# Patient Record
Sex: Female | Born: 1955
Health system: Southern US, Community
[De-identification: ages and names within clinical notes are randomized; demographics above are authoritative.]

## PROBLEM LIST (undated history)

## (undated) DIAGNOSIS — F419 Anxiety disorder, unspecified: Secondary | ICD-10-CM

## (undated) DIAGNOSIS — C4490 Unspecified malignant neoplasm of skin, unspecified: Secondary | ICD-10-CM

## (undated) DIAGNOSIS — A0472 Enterocolitis due to Clostridium difficile, not specified as recurrent: Secondary | ICD-10-CM

## (undated) DIAGNOSIS — I10 Essential (primary) hypertension: Secondary | ICD-10-CM

## (undated) DIAGNOSIS — M810 Age-related osteoporosis without current pathological fracture: Secondary | ICD-10-CM

## (undated) DIAGNOSIS — M1990 Unspecified osteoarthritis, unspecified site: Secondary | ICD-10-CM

## (undated) DIAGNOSIS — N183 Chronic kidney disease, stage 3 unspecified: Secondary | ICD-10-CM

## (undated) DIAGNOSIS — F32A Depression, unspecified: Secondary | ICD-10-CM

## (undated) DIAGNOSIS — D649 Anemia, unspecified: Secondary | ICD-10-CM

## (undated) DIAGNOSIS — B1910 Unspecified viral hepatitis B without hepatic coma: Secondary | ICD-10-CM

## (undated) DIAGNOSIS — K52832 Lymphocytic colitis: Secondary | ICD-10-CM

## (undated) DIAGNOSIS — R413 Other amnesia: Secondary | ICD-10-CM

## (undated) DIAGNOSIS — E785 Hyperlipidemia, unspecified: Secondary | ICD-10-CM

## (undated) DIAGNOSIS — R519 Headache, unspecified: Secondary | ICD-10-CM

## (undated) DIAGNOSIS — N200 Calculus of kidney: Secondary | ICD-10-CM

## (undated) DIAGNOSIS — Z905 Acquired absence of kidney: Secondary | ICD-10-CM

## (undated) DIAGNOSIS — Z950 Presence of cardiac pacemaker: Secondary | ICD-10-CM

## (undated) HISTORY — PX: UPPER GASTROINTESTINAL ENDOSCOPY: SHX188

## (undated) HISTORY — PX: BREAST SURGERY: SHX581

## (undated) HISTORY — DX: Calculus of kidney: N20.0

## (undated) HISTORY — DX: Enterocolitis due to Clostridium difficile, not specified as recurrent: A04.72

## (undated) HISTORY — PX: RHINOPLASTY: SUR1284

## (undated) HISTORY — PX: LAPAROSCOPIC BILATERAL SALPINGO OOPHERECTOMY: SHX5890

## (undated) HISTORY — DX: Anxiety disorder, unspecified: F41.9

## (undated) HISTORY — DX: Unspecified viral hepatitis B without hepatic coma: B19.10

## (undated) HISTORY — PX: COLONOSCOPY: SHX174

## (undated) HISTORY — PX: OTHER SURGICAL HISTORY: SHX169

## (undated) HISTORY — DX: Lymphocytic colitis: K52.832

## (undated) HISTORY — PX: PACEMAKER IMPLANT: EP1218

## (undated) HISTORY — PX: BILATERAL CARPAL TUNNEL RELEASE: SHX6508

## (undated) HISTORY — PX: FOOT SURGERY: SHX648

## (undated) HISTORY — PX: ABDOMINOPLASTY: SUR9

## (undated) HISTORY — PX: ABDOMINAL HYSTERECTOMY: SHX81

## (undated) HISTORY — DX: Age-related osteoporosis without current pathological fracture: M81.0

## (undated) HISTORY — DX: Headache, unspecified: R51.9

## (undated) HISTORY — PX: NEPHROSTOMY: SHX1014

## (undated) HISTORY — PX: HIP SURGERY: SHX245

## (undated) HISTORY — PX: ROTATOR CUFF REPAIR: SHX139

## (undated) HISTORY — PX: NEPHRECTOMY: SHX65

## (undated) HISTORY — DX: Unspecified malignant neoplasm of skin, unspecified: C44.90

## (undated) HISTORY — DX: Hyperlipidemia, unspecified: E78.5

## (undated) HISTORY — DX: Anemia, unspecified: D64.9

## (undated) HISTORY — DX: Essential (primary) hypertension: I10

## (undated) HISTORY — DX: Depression, unspecified: F32.A

## (undated) HISTORY — DX: Unspecified osteoarthritis, unspecified site: M19.90

## (undated) HISTORY — PX: LIPOSUCTION: SHX10

## (undated) HISTORY — DX: Acquired absence of kidney: Z90.5

## (undated) HISTORY — DX: Other amnesia: R41.3

## (undated) HISTORY — PX: HERNIA REPAIR: SHX51

## (undated) HISTORY — PX: TONSILECTOMY/ADENOIDECTOMY WITH MYRINGOTOMY: SHX6125

---
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
## 1977-06-05 HISTORY — PX: BREAST LUMPECTOMY: SHX2

## 1996-06-05 HISTORY — PX: UMBILICAL HERNIA REPAIR: SHX196

## 2006-11-04 HISTORY — PX: ABDOMINAL HYSTERECTOMY: SHX81

## 2007-06-06 HISTORY — PX: NEPHRECTOMY: SHX65

## 2009-03-29 HISTORY — PX: CARPAL TUNNEL RELEASE: SHX101

## 2012-06-19 DIAGNOSIS — R109 Unspecified abdominal pain: Secondary | ICD-10-CM | POA: Insufficient documentation

## 2012-11-18 DIAGNOSIS — G43909 Migraine, unspecified, not intractable, without status migrainosus: Secondary | ICD-10-CM | POA: Insufficient documentation

## 2013-06-05 DIAGNOSIS — N183 Chronic kidney disease, stage 3 unspecified: Secondary | ICD-10-CM

## 2013-06-05 HISTORY — DX: Chronic kidney disease, stage 3 unspecified: N18.30

## 2014-04-24 HISTORY — PX: PACEMAKER IMPLANT: EP1218

## 2014-04-24 HISTORY — PX: CARDIAC CATHETERIZATION: SHX172

## 2016-04-25 HISTORY — PX: HIP ARTHROSCOPY: SHX668

## 2016-04-25 HISTORY — PX: OTHER SURGICAL HISTORY: SHX169

## 2016-09-14 HISTORY — PX: HIP ARTHROSCOPY: SUR88

## 2018-10-02 DIAGNOSIS — M1990 Unspecified osteoarthritis, unspecified site: Secondary | ICD-10-CM | POA: Insufficient documentation

## 2018-10-02 DIAGNOSIS — G4733 Obstructive sleep apnea (adult) (pediatric): Secondary | ICD-10-CM | POA: Insufficient documentation

## 2018-10-02 DIAGNOSIS — Z950 Presence of cardiac pacemaker: Secondary | ICD-10-CM | POA: Insufficient documentation

## 2018-10-02 DIAGNOSIS — E538 Deficiency of other specified B group vitamins: Secondary | ICD-10-CM | POA: Insufficient documentation

## 2018-10-02 DIAGNOSIS — N200 Calculus of kidney: Secondary | ICD-10-CM | POA: Insufficient documentation

## 2018-10-02 DIAGNOSIS — F339 Major depressive disorder, recurrent, unspecified: Secondary | ICD-10-CM | POA: Insufficient documentation

## 2018-10-02 DIAGNOSIS — M546 Pain in thoracic spine: Secondary | ICD-10-CM | POA: Insufficient documentation

## 2018-10-02 DIAGNOSIS — I495 Sick sinus syndrome: Secondary | ICD-10-CM | POA: Insufficient documentation

## 2018-10-02 DIAGNOSIS — I10 Essential (primary) hypertension: Secondary | ICD-10-CM | POA: Insufficient documentation

## 2018-10-02 DIAGNOSIS — G4700 Insomnia, unspecified: Secondary | ICD-10-CM | POA: Insufficient documentation

## 2018-10-02 DIAGNOSIS — N183 Chronic kidney disease, stage 3 unspecified: Secondary | ICD-10-CM | POA: Insufficient documentation

## 2018-10-02 DIAGNOSIS — M76899 Other specified enthesopathies of unspecified lower limb, excluding foot: Secondary | ICD-10-CM | POA: Insufficient documentation

## 2018-10-02 DIAGNOSIS — M810 Age-related osteoporosis without current pathological fracture: Secondary | ICD-10-CM | POA: Insufficient documentation

## 2018-10-02 DIAGNOSIS — F4310 Post-traumatic stress disorder, unspecified: Secondary | ICD-10-CM | POA: Insufficient documentation

## 2018-10-02 DIAGNOSIS — K219 Gastro-esophageal reflux disease without esophagitis: Secondary | ICD-10-CM | POA: Insufficient documentation

## 2018-12-30 HISTORY — PX: SHOULDER ARTHROSCOPY: SHX128

## 2019-11-23 HISTORY — PX: LUMBAR LAMINECTOMY: SHX95

## 2019-11-27 HISTORY — PX: WEIL OSTEOTOMY: SHX5044

## 2020-04-08 ENCOUNTER — Emergency Department (HOSPITAL_BASED_OUTPATIENT_CLINIC_OR_DEPARTMENT_OTHER)
Admission: EM | Admit: 2020-04-08 | Discharge: 2020-04-08 | Disposition: A | Discharge status: Home / Self Care | Payer: Medicare (Managed Care) | Attending: Emergency Medicine | Admitting: Emergency Medicine

## 2020-04-08 ENCOUNTER — Encounter (HOSPITAL_BASED_OUTPATIENT_CLINIC_OR_DEPARTMENT_OTHER): Payer: Self-pay

## 2020-04-08 ENCOUNTER — Other Ambulatory Visit: Payer: Self-pay

## 2020-04-08 ENCOUNTER — Ambulatory Visit
Admission: EM | Admit: 2020-04-08 | Discharge: 2020-04-08 | Payer: Medicare (Managed Care) | Attending: Family Medicine | Admitting: Family Medicine

## 2020-04-08 ENCOUNTER — Emergency Department (HOSPITAL_BASED_OUTPATIENT_CLINIC_OR_DEPARTMENT_OTHER): Payer: Medicare (Managed Care)

## 2020-04-08 DIAGNOSIS — M549 Dorsalgia, unspecified: Secondary | ICD-10-CM

## 2020-04-08 DIAGNOSIS — M545 Low back pain, unspecified: Secondary | ICD-10-CM

## 2020-04-08 DIAGNOSIS — Z950 Presence of cardiac pacemaker: Secondary | ICD-10-CM | POA: Insufficient documentation

## 2020-04-08 DIAGNOSIS — R7989 Other specified abnormal findings of blood chemistry: Secondary | ICD-10-CM

## 2020-04-08 DIAGNOSIS — R202 Paresthesia of skin: Secondary | ICD-10-CM | POA: Insufficient documentation

## 2020-04-08 DIAGNOSIS — N1339 Other hydronephrosis: Secondary | ICD-10-CM

## 2020-04-08 DIAGNOSIS — R109 Unspecified abdominal pain: Secondary | ICD-10-CM | POA: Diagnosis present

## 2020-04-08 DIAGNOSIS — N183 Chronic kidney disease, stage 3 unspecified: Secondary | ICD-10-CM | POA: Diagnosis not present

## 2020-04-08 HISTORY — DX: Presence of cardiac pacemaker: Z95.0

## 2020-04-08 HISTORY — DX: Chronic kidney disease, stage 3 unspecified: N18.30

## 2020-04-08 LAB — POCT URINALYSIS DIP (MANUAL ENTRY)
Bilirubin, UA: NEGATIVE
Blood, UA: NEGATIVE
Glucose, UA: NEGATIVE mg/dL
Ketones, POC UA: NEGATIVE mg/dL
Nitrite, UA: NEGATIVE
Protein Ur, POC: NEGATIVE mg/dL
Spec Grav, UA: 1.01 (ref 1.010–1.025)
Urobilinogen, UA: 0.2 E.U./dL
pH, UA: 5.5 (ref 5.0–8.0)

## 2020-04-08 LAB — CBC WITH DIFFERENTIAL/PLATELET
Abs Immature Granulocytes: 0.01 10*3/uL (ref 0.00–0.07)
Basophils Absolute: 0.1 10*3/uL (ref 0.0–0.1)
Basophils Relative: 1 %
Eosinophils Absolute: 0.1 10*3/uL (ref 0.0–0.5)
Eosinophils Relative: 2 %
HCT: 43.9 % (ref 36.0–46.0)
Hemoglobin: 14.8 g/dL (ref 12.0–15.0)
Immature Granulocytes: 0 %
Lymphocytes Relative: 24 %
Lymphs Abs: 1.4 10*3/uL (ref 0.7–4.0)
MCH: 31.1 pg (ref 26.0–34.0)
MCHC: 33.7 g/dL (ref 30.0–36.0)
MCV: 92.2 fL (ref 80.0–100.0)
Monocytes Absolute: 0.5 10*3/uL (ref 0.1–1.0)
Monocytes Relative: 9 %
Neutro Abs: 3.9 10*3/uL (ref 1.7–7.7)
Neutrophils Relative %: 64 %
Platelets: 210 10*3/uL (ref 150–400)
RBC: 4.76 MIL/uL (ref 3.87–5.11)
RDW: 12.7 % (ref 11.5–15.5)
WBC: 6 10*3/uL (ref 4.0–10.5)
nRBC: 0 % (ref 0.0–0.2)

## 2020-04-08 LAB — COMPREHENSIVE METABOLIC PANEL
ALT: 292 U/L — ABNORMAL HIGH (ref 0–44)
AST: 179 U/L — ABNORMAL HIGH (ref 15–41)
Albumin: 4.3 g/dL (ref 3.5–5.0)
Alkaline Phosphatase: 110 U/L (ref 38–126)
Anion gap: 10 (ref 5–15)
BUN: 18 mg/dL (ref 8–23)
CO2: 23 mmol/L (ref 22–32)
Calcium: 9.2 mg/dL (ref 8.9–10.3)
Chloride: 102 mmol/L (ref 98–111)
Creatinine, Ser: 0.89 mg/dL (ref 0.44–1.00)
GFR, Estimated: >60 mL/min (ref >60)
Glucose, Bld: 86 mg/dL (ref 70–99)
Potassium: 4.2 mmol/L (ref 3.5–5.1)
Sodium: 135 mmol/L (ref 135–145)
Total Bilirubin: 0.8 mg/dL (ref 0.3–1.2)
Total Protein: 7.7 g/dL (ref 6.5–8.1)

## 2020-04-08 LAB — URINALYSIS, ROUTINE W REFLEX MICROSCOPIC
Bilirubin Urine: NEGATIVE
Glucose, UA: NEGATIVE mg/dL
Hgb urine dipstick: NEGATIVE
Ketones, ur: NEGATIVE mg/dL
Leukocytes,Ua: NEGATIVE
Nitrite: NEGATIVE
Protein, ur: NEGATIVE mg/dL
Specific Gravity, Urine: <1.005 — ABNORMAL LOW (ref 1.005–1.030)
pH: 6 (ref 5.0–8.0)

## 2020-04-08 MED ORDER — DIAZEPAM 5 MG/ML IJ SOLN
5.0000 mg | Freq: Once | INTRAMUSCULAR | Status: AC
  Administered 2020-04-08: 5 mg via INTRAVENOUS
  Filled 2020-04-08: qty 2

## 2020-04-08 MED ORDER — OXYCODONE HCL 5 MG PO TABS
5.0000 mg | ORAL_TABLET | Freq: Four times a day (QID) | ORAL | 0 refills | Status: DC | PRN
Start: 2020-04-08 — End: 2020-05-19

## 2020-04-08 MED ORDER — PROMETHAZINE HCL 25 MG/ML IJ SOLN
25.0000 mg | Freq: Once | INTRAMUSCULAR | Status: AC
  Administered 2020-04-08: 25 mg via INTRAVENOUS
  Filled 2020-04-08: qty 1

## 2020-04-08 MED ORDER — MORPHINE SULFATE (PF) 4 MG/ML IV SOLN
4.0000 mg | Freq: Once | INTRAVENOUS | Status: AC
  Administered 2020-04-08: 4 mg via INTRAVENOUS
  Filled 2020-04-08: qty 1

## 2020-04-08 MED ORDER — SODIUM CHLORIDE 0.9 % IV BOLUS
1000.0000 mL | Freq: Once | INTRAVENOUS | Status: AC
  Administered 2020-04-08: 1000 mL via INTRAVENOUS

## 2020-04-08 MED ORDER — TAMSULOSIN HCL 0.4 MG PO CAPS
0.4000 mg | ORAL_CAPSULE | Freq: Every day | ORAL | 0 refills | Status: DC
Start: 2020-04-08 — End: 2020-06-29

## 2020-04-08 NOTE — ED Triage Notes (Signed)
Pt reports having flank and back pain x3 days. Also having nausea.

## 2020-04-08 NOTE — Discharge Instructions (Addendum)
You have a swollen left kidney likely because you passed a stone or you have a stricture.  Take oxycodone for pain.  Take Flomax as prescribed for several days.  Your liver enzymes are elevated so please avoid taking any Tylenol  You need your liver enzymes rechecked in a week.  See urologist for follow-up.  Return to ER if you have worse abdominal pain, flank pain, back pain, numbness, weakness.

## 2020-04-08 NOTE — ED Provider Notes (Signed)
[name] Provider Note   CSN: [id number] Arrival date & time: 04/08/20  1645     History Chief Complaint  Patient presents with  . Flank Pain    Kim Pennington is a 64 y.o. female history of stage III CKD, previous right nephrectomy here presenting with left flank pain.  Patient is here visiting from [place].  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  She states that for the last 3 to 4 days, she has been having left flank pain.  States that the pain is sharp and radiates down the left hip area.  Patient states that she has numbness down his left leg.  She denies any incontinence.  She went to urgent care and was sent a referral evaluation.  Denies any fevers or chills or urinary symptoms.  The history is provided by the patient.       Past Medical History:  Diagnosis Date  . Kidney disease, chronic, stage III (GFR 30-59 ml/min) (HCC)   . Pacemaker     There are no problems to display for this patient.   Past Surgical History:  Procedure Laterality Date  . FOOT SURGERY    . NEPHRECTOMY    . PACEMAKER IMPLANT       OB History   No obstetric history on file.     No family history on file.  Social History   Tobacco Use  . Smoking status: Never Smoker  . Smokeless tobacco: Never Used  Vaping Use  . Vaping Use: Never used  Substance Use Topics  . Alcohol use: Never  . Drug use: Never    Home Medications Prior to Admission medications   Not on File    Allergies    Patient has no known allergies.  Review of Systems   Review of Systems  Gastrointestinal: Positive for abdominal pain.  Genitourinary: Positive for flank pain.  All other systems reviewed and are negative.   Physical Exam Updated Vital Signs BP 107/69 (BP Location: Left Arm)   Pulse 82   Temp 97.9 F (36.6 C) (Oral)   Resp 18   Ht 5\' 2"  (1.575 m)   Wt 57.6 kg   SpO2 99%   BMI 23.23 kg/m   Physical Exam Vitals and nursing note reviewed.    Constitutional:      Comments: Uncomfortable and doubled over with pain  HENT:     Head: Normocephalic.     Nose: Nose normal.     Mouth/Throat:     Mouth: Mucous membranes are moist.  Eyes:     Extraocular Movements: Extraocular movements intact.     Pupils: Pupils are equal, round, and reactive to light.  Cardiovascular:     Rate and Rhythm: Normal rate and regular rhythm.     Pulses: Normal pulses.     Heart sounds: Normal heart sounds.  Pulmonary:     Effort: Pulmonary effort is normal.     Breath sounds: Normal breath sounds.  Abdominal:     General: Abdomen is flat.     Palpations: Abdomen is soft.     Comments: Mild left CVA tenderness.  Also has left paralumbar tenderness  Musculoskeletal:     Cervical back: Normal range of motion.     Comments: Straight leg raise test positive on the left side.  Patient has no saddle anesthesia on exam.  Motor exam lower extremities  Skin:    General: Skin is warm.     Capillary Refill:  Capillary refill takes less than 2 seconds.  Neurological:     Mental Status: She is alert.     Comments: No saddle anesthesia and normal motor exam bilateral lower extremities.  Normal reflexes bilaterally.  Psychiatric:        Mood and Affect: Mood normal.        Behavior: Behavior normal.     ED Results / Procedures / Treatments   Labs (all labs ordered are listed, but only abnormal results are displayed) Labs Reviewed  COMPREHENSIVE METABOLIC PANEL - Abnormal; Notable for the following components:      Result Value   AST 179 (*)    ALT 292 (*)    All other components within normal limits  URINALYSIS, ROUTINE W REFLEX MICROSCOPIC - Abnormal; Notable for the following components:   Specific Gravity, Urine <1.005 (*)    All other components within normal limits  CBC WITH DIFFERENTIAL/PLATELET    EKG None  Radiology CT L-SPINE NO CHARGE  Result Date: 04/08/2020 CLINICAL DATA:  Flank and back pain for 3 days. EXAM: CT LUMBAR SPINE  WITHOUT CONTRAST TECHNIQUE: Multidetector CT imaging of the lumbar spine was performed without intravenous contrast administration. Multiplanar CT image reconstructions were also generated. COMPARISON:  Concurrent CT renal stone. FINDINGS: Segmentation: Transitional lumbosacral anatomy with S1 lumbarization. Alignment: Preservation of lordosis.  Slight lumbar levocurvature. Vertebrae: No fracture or suspicious osseous lesion. Vertebral body heights are preserved. Mild multilevel degenerative changes most prominent at the T11-12 level. Paraspinal and other soft tissues: Paraspinal soft tissues within normal limits. Sequela of right nephrectomy. Intra-abdominal findings including left hydronephrosis are further detailed on concurrent CT renal stone. Disc levels: Patent bony spinal canal.  Patent bony neural foramina. IMPRESSION: No acute osseous abnormality. Mild spondylosis. Partially imaged left hydronephrosis. Please see concurrent CT renal stone for additional findings. Electronically Signed   By: Primitivo Gauze M.D.   On: 04/08/2020 18:10   CT Renal Stone Study  Result Date: 04/08/2020 CLINICAL DATA:  Left-sided flank pain EXAM: CT ABDOMEN AND PELVIS WITHOUT CONTRAST TECHNIQUE: Multidetector CT imaging of the abdomen and pelvis was performed following the standard protocol without IV contrast. COMPARISON:  None. FINDINGS: Lower chest: The visualized heart size within normal limits. No pericardial fluid/thickening. No hiatal hernia. The visualized portions of the lungs are clear. Hepatobiliary: Although limited due to the lack of intravenous contrast, normal in appearance without gross focal abnormality. No evidence of calcified gallstones or biliary ductal dilatation. Pancreas:  Unremarkable.  No surrounding inflammatory changes. Spleen: Normal in size. Although limited due to the lack of intravenous contrast, normal in appearance. Adrenals/Urinary Tract: Both adrenal glands appear normal. The patient is  status post right-sided nephrectomy. Left-sided pelvicaliectasis is seen to the UPJ. No renal or collecting system calculi are noted. The bladder is unremarkable. Stomach/Bowel: The stomach, small bowel, and colon are normal in appearance. No inflammatory changes or obstructive findings. A moderate amount of colonic is throughout. Vascular/Lymphatic: There are no enlarged abdominal or pelvic lymph nodes. Scattered aortic within the distal aorta. Reproductive: The patient is status post hysterectomy. No adnexal masses or collections seen. Other: No evidence of abdominal wall mass or hernia. Musculoskeletal: No acute or significant osseous findings. IMPRESSION: Mild left hydronephrosis to the UPJ which could be from UPJ obstruction. No renal or collecting system calculi. Moderate amount stool without evidence of obstruction. Electronically Signed   By: Prudencio Pair M.D.   On: 04/08/2020 18:24    Procedures Procedures (including critical care time)  Medications  Ordered in ED Medications  morphine 4 MG/ML injection 4 mg (4 mg Intravenous Given 04/08/20 1837)  diazepam (VALIUM) injection 5 mg (5 mg Intravenous Given 04/08/20 1838)  promethazine (PHENERGAN) injection 25 mg (25 mg Intravenous Given 04/08/20 1831)  sodium chloride 0.9 % bolus 1,000 mL (1,000 mLs Intravenous New Bag/Given 04/08/20 1821)    ED Course  I have reviewed the triage vital signs and the nursing notes.  Pertinent labs & imaging results that were available during my care of the patient were reviewed by me and considered in my medical decision making (see chart for details).    MDM Rules/Calculators/A&P                         Kim Pennington is a 64 y.o. female here presenting with left flank pain and left-sided back pain.  Likely renal colic versus sciatica.  She has no red flags require MRI.  Plan to get CT renal stone and CT lumbar spine.  We will also get CBC and CMP and urinalysis.  Will give pain medicine and muscle relaxant  and reassess.  6:57 PM Labs showed slightly elevated LFTs.  Patient does not take any Tylenol and does not drink alcohol.  Patient has no right upper quadrant pain.  Patient CT showed left Hydro.  I suspect that she passed a stone.  She can certainly have sciatica as well.  Plan to discharge patient home with pain medicine, Flomax, urology follow-up.  LFTs can be followed up outpatient   Final Clinical Impression(s) / ED Diagnoses Final diagnoses:  Back pain    Rx / DC Orders ED Discharge Orders    None       Drenda Freeze, MD 04/08/20 1859

## 2020-04-08 NOTE — Discharge Instructions (Addendum)
UA negative for infection  I would have you go to the ER for further evaluation and treatment

## 2020-04-08 NOTE — ED Triage Notes (Signed)
Pt c/o left flank pain started yesterday-reports hx of stage 3 kidney disease and right nephrectomy-grimacing-steady gait

## 2020-04-08 NOTE — ED Notes (Signed)
Patient is being discharged from the Urgent Care and sent to the Emergency Department via pov . Per Kirkland Hun, patient is in need of higher level of care due to . Patient is aware and verbalizes understanding of plan of care.  Vitals:   04/08/20 1553  BP: 110/77  Pulse: 98  Resp: 16  Temp: 98.7 F (37.1 C)  SpO2: 98%

## 2020-04-08 NOTE — ED Provider Notes (Signed)
[facility]   [id number] 04/08/20 Arrival Time: 1534  JQ:[facility]ALP PAIN  SUBJECTIVE: History from: patient. Kim Pennington is a 64 y.o. female complains of left flank and left low back pain that began 3 days ago. Reports that she has had right nephrectomy and has hx hydronephrosis. Also reporting nausea. Reports that she thinks that this is due to the pain. Denies a precipitating event or specific injury. Describes the pain as constant and achy in character. Has not attempted OTC treatment. Reports that OTC medications do not usually help her pain. Reports that this pain is different than anything that she has previously experienced. Symptoms are made worse with activity. Denies fever, chills, erythema, ecchymosis, effusion, weakness, numbness and tingling, saddle paresthesias, loss of bowel or bladder function.      ROS: As per HPI.  All other pertinent ROS negative.     Past Medical History:  Diagnosis Date  . Kidney disease, chronic, stage III (GFR 30-59 ml/min) (HCC)   . Pacemaker    Past Surgical History:  Procedure Laterality Date  . FOOT SURGERY    . NEPHRECTOMY    . PACEMAKER IMPLANT     No Known Allergies No current facility-administered medications on file prior to encounter.   No current outpatient medications on file prior to encounter.   Social History   Socioeconomic History  . Marital status: Married    Spouse name: Not on file  . Number of children: Not on file  . Years of education: Not on file  . Highest education level: Not on file  Occupational History  . Not on file  Tobacco Use  . Smoking status: Never Smoker  . Smokeless tobacco: Never Used  Vaping Use  . Vaping Use: Never used  Substance and Sexual Activity  . Alcohol use: Never  . Drug use: Never  . Sexual activity: Not on file  Other Topics Concern  . Not on file  Social History Narrative  . Not on file   Social Determinants of Health   Financial Resource Strain:   . Difficulty  of Paying Living Expenses: Not on file  Food Insecurity:   . Worried About Charity fundraiser in the Last Year: Not on file  . Ran Out of Food in the Last Year: Not on file  Transportation Needs:   . Lack of Transportation (Medical): Not on file  . Lack of Transportation (Non-Medical): Not on file  Physical Activity:   . Days of Exercise per Week: Not on file  . Minutes of Exercise per Session: Not on file  Stress:   . Feeling of Stress : Not on file  Social Connections:   . Frequency of Communication with Friends and Family: Not on file  . Frequency of Social Gatherings with Friends and Family: Not on file  . Attends Religious Services: Not on file  . Active Member of Clubs or Organizations: Not on file  . Attends Archivist Meetings: Not on file  . Marital Status: Not on file  Intimate Partner Violence:   . Fear of Current or Ex-Partner: Not on file  . Emotionally Abused: Not on file  . Physically Abused: Not on file  . Sexually Abused: Not on file   No family history on file.  OBJECTIVE:  Vitals:   04/08/20 1553  BP: 110/77  Pulse: 98  Resp: 16  Temp: 98.7 F (37.1 C)  TempSrc: Oral  SpO2: 98%    General appearance: ALERT; in acute distress,  doubled over when entering the room, she cannot sit down  Head: NCAT Lungs: Normal respiratory effort CV: pulses 2+ bilaterally. Cap refill < 2 seconds Musculoskeletal:  Inspection: Skin warm, dry, clear and intact without obvious erythema, effusion, or ecchymosis.  Palpation: left low back tender to palpation ROM: limited ROM active and passive to low back with bending twisting and changing positions Skin: warm and dry GU: L CVA tenderness Neurologic: Ambulates without difficulty; Sensation intact about the upper/ lower extremities Psychological: alert and cooperative; normal mood and affect  DIAGNOSTIC STUDIES:  No results found.   ASSESSMENT & PLAN:  1. Left flank pain   2. Acute left-sided low back pain  without sciatica     UA negative for infection in office today Discussed with patient that given her severe pain and concern for her kidney that she would be better served in the ER for further evaluation and treatment They would be able to perform in depth imaging in the ER that we cannot do here Patient is in agreement and will go to ER via private vehicle Stable at discharge      Faustino Congress, NP 04/09/20 1827

## 2020-04-09 ENCOUNTER — Observation Stay (HOSPITAL_COMMUNITY)
Admission: EM | Admit: 2020-04-09 | Discharge: 2020-04-10 | Disposition: A | Discharge status: Home / Self Care | Payer: Medicare (Managed Care) | Attending: Internal Medicine | Admitting: Internal Medicine

## 2020-04-09 ENCOUNTER — Other Ambulatory Visit: Payer: Self-pay

## 2020-04-09 ENCOUNTER — Encounter (HOSPITAL_COMMUNITY): Payer: Self-pay | Admitting: Emergency Medicine

## 2020-04-09 DIAGNOSIS — Z20822 Contact with and (suspected) exposure to covid-19: Secondary | ICD-10-CM | POA: Diagnosis not present

## 2020-04-09 DIAGNOSIS — Z87442 Personal history of urinary calculi: Secondary | ICD-10-CM | POA: Diagnosis not present

## 2020-04-09 DIAGNOSIS — Z950 Presence of cardiac pacemaker: Secondary | ICD-10-CM | POA: Diagnosis not present

## 2020-04-09 DIAGNOSIS — N183 Chronic kidney disease, stage 3 unspecified: Secondary | ICD-10-CM | POA: Diagnosis not present

## 2020-04-09 DIAGNOSIS — R109 Unspecified abdominal pain: Principal | ICD-10-CM | POA: Insufficient documentation

## 2020-04-09 DIAGNOSIS — M545 Low back pain, unspecified: Secondary | ICD-10-CM | POA: Diagnosis not present

## 2020-04-09 DIAGNOSIS — Z905 Acquired absence of kidney: Secondary | ICD-10-CM

## 2020-04-09 LAB — CBC WITH DIFFERENTIAL/PLATELET
Abs Immature Granulocytes: 0.01 10*3/uL (ref 0.00–0.07)
Basophils Absolute: 0 10*3/uL (ref 0.0–0.1)
Basophils Relative: 1 %
Eosinophils Absolute: 0.2 10*3/uL (ref 0.0–0.5)
Eosinophils Relative: 5 %
HCT: 39 % (ref 36.0–46.0)
Hemoglobin: 13 g/dL (ref 12.0–15.0)
Immature Granulocytes: 0 %
Lymphocytes Relative: 46 %
Lymphs Abs: 2.2 10*3/uL (ref 0.7–4.0)
MCH: 31.2 pg (ref 26.0–34.0)
MCHC: 33.3 g/dL (ref 30.0–36.0)
MCV: 93.5 fL (ref 80.0–100.0)
Monocytes Absolute: 0.7 10*3/uL (ref 0.1–1.0)
Monocytes Relative: 14 %
Neutro Abs: 1.6 10*3/uL — ABNORMAL LOW (ref 1.7–7.7)
Neutrophils Relative %: 34 %
Platelets: 196 10*3/uL (ref 150–400)
RBC: 4.17 MIL/uL (ref 3.87–5.11)
RDW: 12.8 % (ref 11.5–15.5)
WBC: 4.7 10*3/uL (ref 4.0–10.5)
nRBC: 0 % (ref 0.0–0.2)

## 2020-04-09 LAB — URINALYSIS, ROUTINE W REFLEX MICROSCOPIC
Bacteria, UA: NONE SEEN
Bilirubin Urine: NEGATIVE
Glucose, UA: NEGATIVE mg/dL
Hgb urine dipstick: NEGATIVE
Ketones, ur: NEGATIVE mg/dL
Nitrite: NEGATIVE
Protein, ur: NEGATIVE mg/dL
Specific Gravity, Urine: 1.004 — ABNORMAL LOW (ref 1.005–1.030)
pH: 7 (ref 5.0–8.0)

## 2020-04-09 LAB — BASIC METABOLIC PANEL
Anion gap: 8 (ref 5–15)
BUN: 17 mg/dL (ref 8–23)
CO2: 23 mmol/L (ref 22–32)
Calcium: 8.8 mg/dL — ABNORMAL LOW (ref 8.9–10.3)
Chloride: 105 mmol/L (ref 98–111)
Creatinine, Ser: 0.96 mg/dL (ref 0.44–1.00)
GFR, Estimated: >60 mL/min (ref >60)
Glucose, Bld: 97 mg/dL (ref 70–99)
Potassium: 4.3 mmol/L (ref 3.5–5.1)
Sodium: 136 mmol/L (ref 135–145)

## 2020-04-09 MED ORDER — ONDANSETRON HCL 4 MG/2ML IJ SOLN
4.0000 mg | Freq: Once | INTRAMUSCULAR | Status: AC
  Administered 2020-04-09: 4 mg via INTRAVENOUS
  Filled 2020-04-09: qty 2

## 2020-04-09 MED ORDER — FENTANYL CITRATE (PF) 100 MCG/2ML IJ SOLN
100.0000 ug | Freq: Once | INTRAMUSCULAR | Status: AC
  Administered 2020-04-09: 100 ug via INTRAVENOUS
  Filled 2020-04-09: qty 2

## 2020-04-09 MED ORDER — HYDROMORPHONE HCL 1 MG/ML IJ SOLN
1.0000 mg | Freq: Once | INTRAMUSCULAR | Status: AC
  Administered 2020-04-09: 1 mg via INTRAVENOUS
  Filled 2020-04-09: qty 1

## 2020-04-09 NOTE — ED Triage Notes (Signed)
Patient c/o left flank pain x3 days. Right side nephrectomy.

## 2020-04-10 DIAGNOSIS — R109 Unspecified abdominal pain: Secondary | ICD-10-CM | POA: Diagnosis not present

## 2020-04-10 DIAGNOSIS — Z905 Acquired absence of kidney: Secondary | ICD-10-CM | POA: Diagnosis not present

## 2020-04-10 LAB — CBC
HCT: 34.9 % — ABNORMAL LOW (ref 36.0–46.0)
Hemoglobin: 11.5 g/dL — ABNORMAL LOW (ref 12.0–15.0)
MCH: 31.3 pg (ref 26.0–34.0)
MCHC: 33 g/dL (ref 30.0–36.0)
MCV: 94.8 fL (ref 80.0–100.0)
Platelets: 157 10*3/uL (ref 150–400)
RBC: 3.68 MIL/uL — ABNORMAL LOW (ref 3.87–5.11)
RDW: 12.8 % (ref 11.5–15.5)
WBC: 5.3 10*3/uL (ref 4.0–10.5)
nRBC: 0 % (ref 0.0–0.2)

## 2020-04-10 LAB — RESPIRATORY PANEL BY RT PCR (FLU A&B, COVID)
Influenza A by PCR: NEGATIVE
Influenza B by PCR: NEGATIVE
SARS Coronavirus 2 by RT PCR: NEGATIVE

## 2020-04-10 LAB — COMPREHENSIVE METABOLIC PANEL
ALT: 201 U/L — ABNORMAL HIGH (ref 0–44)
AST: 125 U/L — ABNORMAL HIGH (ref 15–41)
Albumin: 3.3 g/dL — ABNORMAL LOW (ref 3.5–5.0)
Alkaline Phosphatase: 80 U/L (ref 38–126)
Anion gap: 12 (ref 5–15)
BUN: 14 mg/dL (ref 8–23)
CO2: 18 mmol/L — ABNORMAL LOW (ref 22–32)
Calcium: 8.4 mg/dL — ABNORMAL LOW (ref 8.9–10.3)
Chloride: 105 mmol/L (ref 98–111)
Creatinine, Ser: 0.95 mg/dL (ref 0.44–1.00)
GFR, Estimated: >60 mL/min (ref >60)
Glucose, Bld: 168 mg/dL — ABNORMAL HIGH (ref 70–99)
Potassium: 3.8 mmol/L (ref 3.5–5.1)
Sodium: 135 mmol/L (ref 135–145)
Total Bilirubin: 0.6 mg/dL (ref 0.3–1.2)
Total Protein: 5.6 g/dL — ABNORMAL LOW (ref 6.5–8.1)

## 2020-04-10 LAB — HIV ANTIBODY (ROUTINE TESTING W REFLEX): HIV Screen 4th Generation wRfx: NONREACTIVE

## 2020-04-10 MED ORDER — ENOXAPARIN SODIUM 40 MG/0.4ML ~~LOC~~ SOLN
40.0000 mg | SUBCUTANEOUS | Status: DC
  Administered 2020-04-10: 40 mg via SUBCUTANEOUS
  Filled 2020-04-10: qty 0.4

## 2020-04-10 MED ORDER — MONTELUKAST SODIUM 10 MG PO TABS
10.0000 mg | ORAL_TABLET | Freq: Every day | ORAL | Status: DC
  Administered 2020-04-10: 10 mg via ORAL
  Filled 2020-04-10: qty 1

## 2020-04-10 MED ORDER — SODIUM CHLORIDE 0.9 % IV SOLN: INTRAVENOUS | Status: DC

## 2020-04-10 MED ORDER — KETOROLAC TROMETHAMINE 10 MG PO TABS: 10.0000 mg | ORAL_TABLET | Freq: Four times a day (QID) | ORAL | 0 refills | Status: DC | PRN

## 2020-04-10 MED ORDER — ONDANSETRON HCL 4 MG/2ML IJ SOLN
4.0000 mg | Freq: Four times a day (QID) | INTRAMUSCULAR | Status: DC | PRN
  Administered 2020-04-10: 4 mg via INTRAVENOUS
  Filled 2020-04-10: qty 2

## 2020-04-10 MED ORDER — ACETAMINOPHEN 650 MG RE SUPP: 650.0000 mg | Freq: Four times a day (QID) | RECTAL | Status: DC | PRN

## 2020-04-10 MED ORDER — TAMSULOSIN HCL 0.4 MG PO CAPS
0.4000 mg | ORAL_CAPSULE | Freq: Every day | ORAL | Status: DC
  Administered 2020-04-10: 10:00:00 0.4 mg via ORAL
  Filled 2020-04-10: qty 1

## 2020-04-10 MED ORDER — ZOLPIDEM TARTRATE 5 MG PO TABS: 5.0000 mg | ORAL_TABLET | Freq: Every day | ORAL | Status: DC

## 2020-04-10 MED ORDER — ONDANSETRON HCL 4 MG PO TABS: 4.0000 mg | ORAL_TABLET | Freq: Four times a day (QID) | ORAL | Status: DC | PRN

## 2020-04-10 MED ORDER — ACETAMINOPHEN 325 MG PO TABS: 650.0000 mg | ORAL_TABLET | Freq: Four times a day (QID) | ORAL | Status: DC | PRN

## 2020-04-10 MED ORDER — ALPRAZOLAM 0.5 MG PO TABS: 0.5000 mg | ORAL_TABLET | Freq: Three times a day (TID) | ORAL | Status: DC | PRN

## 2020-04-10 MED ORDER — ATORVASTATIN CALCIUM 40 MG PO TABS
80.0000 mg | ORAL_TABLET | Freq: Every day | ORAL | Status: DC
  Administered 2020-04-10: 10:00:00 80 mg via ORAL
  Filled 2020-04-10: qty 2

## 2020-04-10 MED ORDER — PNEUMOCOCCAL VAC POLYVALENT 25 MCG/0.5ML IJ INJ: 0.5000 mL | INJECTION | INTRAMUSCULAR | Status: DC

## 2020-04-10 MED ORDER — PROMETHAZINE HCL 25 MG/ML IJ SOLN
12.5000 mg | Freq: Once | INTRAMUSCULAR | Status: AC
  Administered 2020-04-10: 12.5 mg via INTRAVENOUS
  Filled 2020-04-10: qty 1

## 2020-04-10 MED ORDER — LOSARTAN POTASSIUM 25 MG PO TABS
25.0000 mg | ORAL_TABLET | Freq: Every day | ORAL | Status: DC
  Administered 2020-04-10: 25 mg via ORAL
  Filled 2020-04-10: qty 1

## 2020-04-10 MED ORDER — FENTANYL CITRATE (PF) 100 MCG/2ML IJ SOLN
100.0000 ug | Freq: Once | INTRAMUSCULAR | Status: AC
  Administered 2020-04-10: 100 ug via INTRAVENOUS
  Filled 2020-04-10: qty 2

## 2020-04-10 MED ORDER — PROCHLORPERAZINE EDISYLATE 10 MG/2ML IJ SOLN: 10.0000 mg | Freq: Four times a day (QID) | INTRAMUSCULAR | Status: DC | PRN

## 2020-04-10 MED ORDER — HYDROMORPHONE HCL 1 MG/ML IJ SOLN
0.5000 mg | INTRAMUSCULAR | Status: DC | PRN
  Administered 2020-04-10 (×2): 1 mg via INTRAVENOUS
  Filled 2020-04-10 (×2): qty 1

## 2020-04-10 MED ORDER — AMLODIPINE BESYLATE 10 MG PO TABS
10.0000 mg | ORAL_TABLET | Freq: Every day | ORAL | Status: DC
  Administered 2020-04-10: 10:00:00 10 mg via ORAL
  Filled 2020-04-10: qty 1

## 2020-04-10 NOTE — Discharge Summary (Signed)
Physician Discharge Summary  Kim Pennington KPT:[id number] DOB: September 10, 1955 DOA: 04/09/2020  PCP: System, Provider Not In  Admit date: 04/09/2020 Discharge date: 04/10/2020  Admitted From: Home Disposition:  Home  Recommendations for Outpatient Follow-up:  1. Follow up with PCP in 2-3 weeks 2. Follow up with Urology as scheduled  Discharge Condition:Stable CODE STATUS:Full Diet recommendation: Regular   Brief/Interim Summary: 64 y.o. female with medical history significant of hypertension, hyperlipidemia and anxiety presented to ED for evaluation of worsening left-sided and low back pain. Patient states that she was seen in ER yesterday for the same problem and had a CT scan done that showed mild hydronephrosis and he was home with oxycodone. Patient also admitted of having few episodes of vomiting patient further mentioned that she tried to get an early appointment with urology but could not get an appointment. Pt subsequently presented to the ED for further eval.  Discharge Diagnoses:  Principal Problem:   Left flank discomfort Active Problems:   H/O right nephrectomy  Principal Problem:   Left flank discomfort with hydronephrosis -Pain fairly controlled while in hospital -Urology consulted. Discussed with Dr. Al Pimple. As pt is not acutely infected with pain relatively controlled, plan is for pt to f/u closely with Urology for further work up  -will prescribe limited quantity of toradol at time of d/c  Active Problems:   H/O right nephrectomy -Patient is s/p right nephrectomy -renal function remains stable and baseline normal  Hypertension -Blood pressures remain stable and controlled -Continue home medications.  Hyperlipidemia -continue on statin as tolerated  Anxiety -appears comfortable this AM - Continue home xanax as tolerated  Discharge Instructions   Allergies as of 04/10/2020      Reactions   Tape Hives   Tape adhesive long period of time        Medication List    TAKE these medications   alendronate 70 MG tablet Commonly known as: FOSAMAX Take 70 mg by mouth once a week.   ALPRAZolam 1 MG tablet Commonly known as: XANAX Take 0.5 mg by mouth 3 (three) times daily as needed.   amLODipine 10 MG tablet Commonly known as: NORVASC Take 10 mg by mouth daily.   atorvastatin 80 MG tablet Commonly known as: LIPITOR Take 80 mg by mouth daily.   budesonide 3 MG 24 hr capsule Commonly known as: ENTOCORT EC TK 1 C PO QD IN THE MORNING FOR UP TO 3 MONTHS   CALCIUM 1200 PO Take 1 tablet by mouth daily.   CRANBERRY-CALCIUM PO Take 1 tablet by mouth daily.   estradiol 0.5 MG tablet Commonly known as: ESTRACE Take 0.5 mg by mouth daily.   ketorolac 10 MG tablet Commonly known as: TORADOL Take 1 tablet (10 mg total) by mouth every 6 (six) hours as needed.   losartan 25 MG tablet Commonly known as: COZAAR Take 25 mg by mouth daily.   montelukast 10 MG tablet Commonly known as: SINGULAIR Take 10 mg by mouth daily.   oxyCODONE 5 MG immediate release tablet Commonly known as: Roxicodone Take 1 tablet (5 mg total) by mouth every 6 (six) hours as needed for severe pain.   tamsulosin 0.4 MG Caps capsule Commonly known as: Flomax Take 1 capsule (0.4 mg total) by mouth daily.   VITAMIN B 12 PO Take 1 tablet by mouth daily.   WIXELA INHUB IN Inhale 1 puff into the lungs 2 (two) times daily.   zolpidem 5 MG tablet Commonly known as: AMBIEN Take 5 mg by  mouth at bedtime.       Follow-up Information    Follow up with your PCP in 2-3 weeks Follow up.        [facility] Follow up.   Why: You will be contacted to schedule an outpatient appointment Contact information: [place] [phone]             Allergies  Allergen Reactions  . Tape Hives    Tape adhesive long period of time     Consultations:  Urology  Procedures/Studies: CT L-SPINE  NO CHARGE  Result Date: 04/08/2020 CLINICAL DATA:  Flank and back pain for 3 days. EXAM: CT LUMBAR SPINE WITHOUT CONTRAST TECHNIQUE: Multidetector CT imaging of the lumbar spine was performed without intravenous contrast administration. Multiplanar CT image reconstructions were also generated. COMPARISON:  Concurrent CT renal stone. FINDINGS: Segmentation: Transitional lumbosacral anatomy with S1 lumbarization. Alignment: Preservation of lordosis.  Slight lumbar levocurvature. Vertebrae: No fracture or suspicious osseous lesion. Vertebral body heights are preserved. Mild multilevel degenerative changes most prominent at the T11-12 level. Paraspinal and other soft tissues: Paraspinal soft tissues within normal limits. Sequela of right nephrectomy. Intra-abdominal findings including left hydronephrosis are further detailed on concurrent CT renal stone. Disc levels: Patent bony spinal canal.  Patent bony neural foramina. IMPRESSION: No acute osseous abnormality. Mild spondylosis. Partially imaged left hydronephrosis. Please see concurrent CT renal stone for additional findings. Electronically Signed   By: Primitivo Gauze M.D.   On: 04/08/2020 18:10   CT Renal Stone Study  Result Date: 04/08/2020 CLINICAL DATA:  Left-sided flank pain EXAM: CT ABDOMEN AND PELVIS WITHOUT CONTRAST TECHNIQUE: Multidetector CT imaging of the abdomen and pelvis was performed following the standard protocol without IV contrast. COMPARISON:  None. FINDINGS: Lower chest: The visualized heart size within normal limits. No pericardial fluid/thickening. No hiatal hernia. The visualized portions of the lungs are clear. Hepatobiliary: Although limited due to the lack of intravenous contrast, normal in appearance without gross focal abnormality. No evidence of calcified gallstones or biliary ductal dilatation. Pancreas:  Unremarkable.  No surrounding inflammatory changes. Spleen: Normal in size. Although limited due to the lack of  intravenous contrast, normal in appearance. Adrenals/Urinary Tract: Both adrenal glands appear normal. The patient is status post right-sided nephrectomy. Left-sided pelvicaliectasis is seen to the UPJ. No renal or collecting system calculi are noted. The bladder is unremarkable. Stomach/Bowel: The stomach, small bowel, and colon are normal in appearance. No inflammatory changes or obstructive findings. A moderate amount of colonic is throughout. Vascular/Lymphatic: There are no enlarged abdominal or pelvic lymph nodes. Scattered aortic within the distal aorta. Reproductive: The patient is status post hysterectomy. No adnexal masses or collections seen. Other: No evidence of abdominal wall mass or hernia. Musculoskeletal: No acute or significant osseous findings. IMPRESSION: Mild left hydronephrosis to the UPJ which could be from UPJ obstruction. No renal or collecting system calculi. Moderate amount stool without evidence of obstruction. Electronically Signed   By: Prudencio Pair M.D.   On: 04/08/2020 18:24     Subjective: Eager to go home today  Discharge Exam: Vitals:   04/10/20 0422 04/10/20 0947  BP: 132/86 116/69  Pulse: 66 63  Resp: 14 18  Temp: 97.6 F (36.4 C) 98.1 F (36.7 C)  SpO2: 100% 100%   Vitals:   04/10/20 0244 04/10/20 0345 04/10/20 0422 04/10/20 0947  BP: 101/75 108/72 132/86 116/69  Pulse: 63 65 66 63  Resp: 16 16 14  18  Temp:   97.6 F (36.4 C) 98.1 F (36.7 C)  TempSrc:   Oral   SpO2: 99% 98% 100% 100%  Weight:      Height:        General: Pt is alert, awake, not in acute distress Cardiovascular: RRR, S1/S2 +, no rubs, no gallops Respiratory: CTA bilaterally, no wheezing, no rhonchi Abdominal: Soft, NT, ND, bowel sounds + Extremities: no edema, no cyanosis   The results of significant diagnostics from this hospitalization (including imaging, microbiology, ancillary and laboratory) are listed below for reference.     Microbiology: Recent Results (from the  past 240 hour(s))  Respiratory Panel by RT PCR (Flu A&B, Covid) - Nasopharyngeal Swab     Status: None   Collection Time: 04/10/20 12:17 AM   Specimen: Nasopharyngeal Swab  Result Value Ref Range Status   SARS Coronavirus 2 by RT PCR NEGATIVE NEGATIVE Final    Comment: (NOTE) SARS-CoV-2 target nucleic acids are NOT DETECTED.  The SARS-CoV-2 RNA is generally detectable in upper respiratoy specimens during the acute phase of infection. The lowest concentration of SARS-CoV-2 viral copies this assay can detect is 131 copies/mL. A negative result does not preclude SARS-Cov-2 infection and should not be used as the sole basis for treatment or other patient management decisions. A negative result may occur with  improper specimen collection/handling, submission of specimen other than nasopharyngeal swab, presence of viral mutation(s) within the areas targeted by this assay, and inadequate number of viral copies (<131 copies/mL). A negative result must be combined with clinical observations, patient history, and epidemiological information. The expected result is Negative.  Fact Sheet for Patients:  PinkCheek.be  Fact Sheet for Healthcare Providers:  GravelBags.it  This test is no t yet approved or cleared by the [place] FDA and  has been authorized for detection and/or diagnosis of SARS-CoV-2 by FDA under an Emergency Use Authorization (EUA). This EUA will remain  in effect (meaning this test can be used) for the duration of the COVID-19 declaration under Section 564(b)(1) of the Act, 21 U.S.C. section 360bbb-3(b)(1), unless the authorization is terminated or revoked sooner.     Influenza A by PCR NEGATIVE NEGATIVE Final   Influenza B by PCR NEGATIVE NEGATIVE Final    Comment: (NOTE) The Xpert Xpress SARS-CoV-2/FLU/RSV assay is intended as an aid in  the diagnosis of influenza from Nasopharyngeal swab specimens and   should not be used as a sole basis for treatment. Nasal washings and  aspirates are unacceptable for Xpert Xpress SARS-CoV-2/FLU/RSV  testing.  Fact Sheet for Patients: PinkCheek.be  Fact Sheet for Healthcare Providers: GravelBags.it  This test is not yet approved or cleared by the [place] FDA and  has been authorized for detection and/or diagnosis of SARS-CoV-2 by  FDA under an Emergency Use Authorization (EUA). This EUA will remain  in effect (meaning this test can be used) for the duration of the  Covid-19 declaration under Section 564(b)(1) of the Act, 21  U.S.C. section 360bbb-3(b)(1), unless the authorization is  terminated or revoked. Performed at [facility], [place] [address], [place], [place] [address]      Labs: BNP (last 3 results) No results for input(s): BNP in the last 8760 hours. Basic Metabolic Panel: Recent Labs  Lab 04/08/20 1812 04/09/20 2124 04/10/20 0636  NA 135 136 135  K 4.2 4.3 3.8  CL 102 105 105  CO2 23 23 18*  GLUCOSE 86 97 168*  BUN 18 17 14   CREATININE  0.89 0.96 0.95  CALCIUM 9.2 8.8* 8.4*   Liver Function Tests: Recent Labs  Lab 04/08/20 1812 04/10/20 0636  AST 179* 125*  ALT 292* 201*  ALKPHOS 110 80  BILITOT 0.8 0.6  PROT 7.7 5.6*  ALBUMIN 4.3 3.3*   No results for input(s): LIPASE, AMYLASE in the last 168 hours. No results for input(s): AMMONIA in the last 168 hours. CBC: Recent Labs  Lab 04/08/20 1812 04/09/20 2124 04/10/20 0636  WBC 6.0 4.7 5.3  NEUTROABS 3.9 1.6*  --   HGB 14.8 13.0 11.5*  HCT 43.9 39.0 34.9*  MCV 92.2 93.5 94.8  PLT 210 196 157   Cardiac Enzymes: No results for input(s): CKTOTAL, CKMB, CKMBINDEX, TROPONINI in the last 168 hours. BNP: Invalid input(s): POCBNP CBG: No results for input(s): GLUCAP in the last 168 hours. D-Dimer No results for input(s): DDIMER in the last 72 hours. Hgb A1c No results for  input(s): HGBA1C in the last 72 hours. Lipid Profile No results for input(s): CHOL, HDL, LDLCALC, TRIG, CHOLHDL, LDLDIRECT in the last 72 hours. Thyroid function studies No results for input(s): TSH, T4TOTAL, T3FREE, THYROIDAB in the last 72 hours.  Invalid input(s): FREET3 Anemia work up No results for input(s): VITAMINB12, FOLATE, FERRITIN, TIBC, IRON, RETICCTPCT in the last 72 hours. Urinalysis    Component Value Date/Time   COLORURINE STRAW (A) 04/09/2020 2019   APPEARANCEUR CLEAR 04/09/2020 2019   LABSPEC 1.004 (L) 04/09/2020 2019   PHURINE 7.0 04/09/2020 2019   GLUCOSEU NEGATIVE 04/09/2020 2019   HGBUR NEGATIVE 04/09/2020 2019   BILIRUBINUR NEGATIVE 04/09/2020 2019   BILIRUBINUR negative 04/08/2020 1600   KETONESUR NEGATIVE 04/09/2020 2019   PROTEINUR NEGATIVE 04/09/2020 2019   UROBILINOGEN 0.2 04/08/2020 1600   NITRITE NEGATIVE 04/09/2020 2019   LEUKOCYTESUR TRACE (A) 04/09/2020 2019   Sepsis Labs Invalid input(s): PROCALCITONIN,  WBC,  LACTICIDVEN Microbiology Recent Results (from the past 240 hour(s))  Respiratory Panel by RT PCR (Flu A&B, Covid) - Nasopharyngeal Swab     Status: None   Collection Time: 04/10/20 12:17 AM   Specimen: Nasopharyngeal Swab  Result Value Ref Range Status   SARS Coronavirus 2 by RT PCR NEGATIVE NEGATIVE Final    Comment: (NOTE) SARS-CoV-2 target nucleic acids are NOT DETECTED.  The SARS-CoV-2 RNA is generally detectable in upper respiratoy specimens during the acute phase of infection. The lowest concentration of SARS-CoV-2 viral copies this assay can detect is 131 copies/mL. A negative result does not preclude SARS-Cov-2 infection and should not be used as the sole basis for treatment or other patient management decisions. A negative result may occur with  improper specimen collection/handling, submission of specimen other than nasopharyngeal swab, presence of viral mutation(s) within the areas targeted by this assay, and inadequate  number of viral copies (<131 copies/mL). A negative result must be combined with clinical observations, patient history, and epidemiological information. The expected result is Negative.  Fact Sheet for Patients:  PinkCheek.be  Fact Sheet for Healthcare Providers:  GravelBags.it  This test is no t yet approved or cleared by the [place] FDA and  has been authorized for detection and/or diagnosis of SARS-CoV-2 by FDA under an Emergency Use Authorization (EUA). This EUA will remain  in effect (meaning this test can be used) for the duration of the COVID-19 declaration under Section 564(b)(1) of the Act, 21 U.S.C. section 360bbb-3(b)(1), unless the authorization is terminated or revoked sooner.     Influenza A by PCR NEGATIVE NEGATIVE Final   Influenza  B by PCR NEGATIVE NEGATIVE Final    Comment: (NOTE) The Xpert Xpress SARS-CoV-2/FLU/RSV assay is intended as an aid in  the diagnosis of influenza from Nasopharyngeal swab specimens and  should not be used as a sole basis for treatment. Nasal washings and  aspirates are unacceptable for Xpert Xpress SARS-CoV-2/FLU/RSV  testing.  Fact Sheet for Patients: PinkCheek.be  Fact Sheet for Healthcare Providers: GravelBags.it  This test is not yet approved or cleared by the [place] FDA and  has been authorized for detection and/or diagnosis of SARS-CoV-2 by  FDA under an Emergency Use Authorization (EUA). This EUA will remain  in effect (meaning this test can be used) for the duration of the  Covid-19 declaration under Section 564(b)(1) of the Act, 21  U.S.C. section 360bbb-3(b)(1), unless the authorization is  terminated or revoked. Performed at [facility], [place] [address] [place], [place] [address]    Time spent: 30 min  SIGNED:   Marylu Lund, MD  [facility] 04/10/2020,  12:41 PM  If 7PM-7AM, please contact night-coverage

## 2020-04-10 NOTE — Progress Notes (Signed)
Patient was given discharge instructions, and all questions were answered.  Patient was walked to main exit. 

## 2020-04-10 NOTE — Consult Note (Signed)
Urology Consult   Physician requesting consult: Dr. Marylu Lund  Reason for consult: flank pain, mild hydronephrosis  History of Present Illness: Kim Pennington is a 64 y.o. woman with history of stones, now with solitary left kidney following removal of her right kidney several years ago who presented with left flank pain over the last several days. She reports her right kidney was removed due to stones/hydronephrosis and that she was seen recently for slightly swollen left kidney on imaging. she denies associated fevers,chills, nausea, vomiting, hematuria, dysuria at this time, but has had some nausea and vomiting intermittently during the last several days. CT scan with mild hydronephrosis of left kidney with narrowing at left UPJ concerning for UPJ obstruction. No stones noted. No leukocytosis, UA without concern for infection, Cr at baseline.  Past Medical History:  Diagnosis Date  . Kidney disease, chronic, stage III (GFR 30-59 ml/min) (HCC)   . Pacemaker     Past Surgical History:  Procedure Laterality Date  . FOOT SURGERY    . NEPHRECTOMY    . PACEMAKER IMPLANT       Current Hospital Medications:  Home meds:  No current facility-administered medications on file prior to encounter.   Current Outpatient Medications on File Prior to Encounter  Medication Sig Dispense Refill  . alendronate (FOSAMAX) 70 MG tablet Take 70 mg by mouth once a week.    . ALPRAZolam (XANAX) 1 MG tablet Take 0.5 mg by mouth 3 (three) times daily as needed.    Marland Kitchen amLODipine (NORVASC) 10 MG tablet Take 10 mg by mouth daily.    Marland Kitchen atorvastatin (LIPITOR) 80 MG tablet Take 80 mg by mouth daily.    . budesonide (ENTOCORT EC) 3 MG 24 hr capsule TK 1 C PO QD IN THE MORNING FOR UP TO 3 MONTHS    . Calcium Carbonate-Vit D-Min (CALCIUM 1200 PO) Take 1 tablet by mouth daily.    Marland Kitchen CRANBERRY-CALCIUM PO Take 1 tablet by mouth daily.    . Cyanocobalamin (VITAMIN B 12 PO) Take 1 tablet by mouth daily.    Marland Kitchen estradiol  (ESTRACE) 0.5 MG tablet Take 0.5 mg by mouth daily.    . Fluticasone-Salmeterol (WIXELA INHUB IN) Inhale 1 puff into the lungs 2 (two) times daily.    Marland Kitchen losartan (COZAAR) 25 MG tablet Take 25 mg by mouth daily.    . montelukast (SINGULAIR) 10 MG tablet Take 10 mg by mouth daily.    Marland Kitchen oxyCODONE (ROXICODONE) 5 MG immediate release tablet Take 1 tablet (5 mg total) by mouth every 6 (six) hours as needed for severe pain. 8 tablet 0  . tamsulosin (FLOMAX) 0.4 MG CAPS capsule Take 1 capsule (0.4 mg total) by mouth daily. 5 capsule 0  . zolpidem (AMBIEN) 5 MG tablet Take 5 mg by mouth at bedtime.       Scheduled Meds: . amLODipine  10 mg Oral Daily  . atorvastatin  80 mg Oral Daily  . enoxaparin (LOVENOX) injection  40 mg Subcutaneous Q24H  . losartan  25 mg Oral Daily  . montelukast  10 mg Oral Daily  . [START ON 04/11/2020] pneumococcal 23 valent vaccine  0.5 mL Intramuscular Tomorrow-1000  . tamsulosin  0.4 mg Oral Daily  . zolpidem  5 mg Oral QHS   Continuous Infusions: . sodium chloride 75 mL/hr at 04/10/20 0205   PRN Meds:.acetaminophen **OR** acetaminophen, ALPRAZolam, HYDROmorphone (DILAUDID) injection, ondansetron **OR** ondansetron (ZOFRAN) IV, prochlorperazine  Allergies:  Allergies  Allergen Reactions  . Tape  Hives    Tape adhesive long period of time     No family history on file.  Social History:  reports that she has never smoked. She has never used smokeless tobacco. She reports that she does not drink alcohol and does not use drugs.  ROS: A complete review of systems was performed.  All systems are negative except for pertinent findings as noted.  Physical Exam:  Vital signs in last 24 hours: Temp:  [97.6 F (36.4 C)-98.1 F (36.7 C)] 98.1 F (36.7 C) (11/06 0947) Pulse Rate:  [60-86] 63 (11/06 0947) Resp:  [14-18] 18 (11/06 0947) BP: (97-143)/(66-112) 116/69 (11/06 0947) SpO2:  [94 %-100 %] 100 % (11/06 0947) Weight:  [56.7 kg] 56.7 kg (11/05  2008) Constitutional:  Alert and oriented, No acute distress Cardiovascular: Regular rate and rhythm, No JVD Respiratory: Normal respiratory effort, Lungs clear bilaterally GI: Abdomen is soft, nontender, nondistended, no abdominal masses GU: no CVA tenderness, voiding spontaneously Lymphatic: No lymphadenopathy Neurologic: Grossly intact, no focal deficits Psychiatric: Normal mood and affect  Laboratory Data:  Recent Labs    04/08/20 1812 04/09/20 2124 04/10/20 0636  WBC 6.0 4.7 5.3  HGB 14.8 13.0 11.5*  HCT 43.9 39.0 34.9*  PLT 210 196 157    Recent Labs    04/08/20 1812 04/09/20 2124 04/10/20 0636  NA 135 136 135  K 4.2 4.3 3.8  CL 102 105 105  GLUCOSE 86 97 168*  BUN 18 17 14   CALCIUM 9.2 8.8* 8.4*  CREATININE 0.89 0.96 0.95     Results for orders placed or performed during the hospital encounter of 04/09/20 (from the past 24 hour(s))  Urinalysis, Routine w reflex microscopic Urine, Clean Catch     Status: Abnormal   Collection Time: 04/09/20  8:19 PM  Result Value Ref Range   Color, Urine STRAW (A) YELLOW   APPearance CLEAR CLEAR   Specific Gravity, Urine 1.004 (L) 1.005 - 1.030   pH 7.0 5.0 - 8.0   Glucose, UA NEGATIVE NEGATIVE mg/dL   Hgb urine dipstick NEGATIVE NEGATIVE   Bilirubin Urine NEGATIVE NEGATIVE   Ketones, ur NEGATIVE NEGATIVE mg/dL   Protein, ur NEGATIVE NEGATIVE mg/dL   Nitrite NEGATIVE NEGATIVE   Leukocytes,Ua TRACE (A) NEGATIVE   WBC, UA 0-5 0 - 5 WBC/hpf   Bacteria, UA NONE SEEN NONE SEEN   Squamous Epithelial / LPF 0-5 0 - 5  Basic metabolic panel     Status: Abnormal   Collection Time: 04/09/20  9:24 PM  Result Value Ref Range   Sodium 136 135 - 145 mmol/L   Potassium 4.3 3.5 - 5.1 mmol/L   Chloride 105 98 - 111 mmol/L   CO2 23 22 - 32 mmol/L   Glucose, Bld 97 70 - 99 mg/dL   BUN 17 8 - 23 mg/dL   Creatinine, Ser 0.96 0.44 - 1.00 mg/dL   Calcium 8.8 (L) 8.9 - 10.3 mg/dL   GFR, Estimated >60 >60 mL/min   Anion gap 8 5 - 15   CBC with Differential     Status: Abnormal   Collection Time: 04/09/20  9:24 PM  Result Value Ref Range   WBC 4.7 4.0 - 10.5 K/uL   RBC 4.17 3.87 - 5.11 MIL/uL   Hemoglobin 13.0 12.0 - 15.0 g/dL   HCT 39.0 36 - 46 %   MCV 93.5 80.0 - 100.0 fL   MCH 31.2 26.0 - 34.0 pg   MCHC 33.3 30.0 - 36.0 g/dL  RDW 12.8 11.5 - 15.5 %   Platelets 196 150 - 400 K/uL   nRBC 0.0 0.0 - 0.2 %   Neutrophils Relative % 34 %   Neutro Abs 1.6 (L) 1.7 - 7.7 K/uL   Lymphocytes Relative 46 %   Lymphs Abs 2.2 0.7 - 4.0 K/uL   Monocytes Relative 14 %   Monocytes Absolute 0.7 0.1 - 1.0 K/uL   Eosinophils Relative 5 %   Eosinophils Absolute 0.2 0.0 - 0.5 K/uL   Basophils Relative 1 %   Basophils Absolute 0.0 0.0 - 0.1 K/uL   Immature Granulocytes 0 %   Abs Immature Granulocytes 0.01 0.00 - 0.07 K/uL   Reactive, Benign Lymphocytes PRESENT   Respiratory Panel by RT PCR (Flu A&B, Covid) - Nasopharyngeal Swab     Status: None   Collection Time: 04/10/20 12:17 AM   Specimen: Nasopharyngeal Swab  Result Value Ref Range   SARS Coronavirus 2 by RT PCR NEGATIVE NEGATIVE   Influenza A by PCR NEGATIVE NEGATIVE   Influenza B by PCR NEGATIVE NEGATIVE  HIV Antibody (routine testing w rflx)     Status: None   Collection Time: 04/10/20  1:36 AM  Result Value Ref Range   HIV Screen 4th Generation wRfx Non Reactive Non Reactive  Comprehensive metabolic panel     Status: Abnormal   Collection Time: 04/10/20  6:36 AM  Result Value Ref Range   Sodium 135 135 - 145 mmol/L   Potassium 3.8 3.5 - 5.1 mmol/L   Chloride 105 98 - 111 mmol/L   CO2 18 (L) 22 - 32 mmol/L   Glucose, Bld 168 (H) 70 - 99 mg/dL   BUN 14 8 - 23 mg/dL   Creatinine, Ser 0.95 0.44 - 1.00 mg/dL   Calcium 8.4 (L) 8.9 - 10.3 mg/dL   Total Protein 5.6 (L) 6.5 - 8.1 g/dL   Albumin 3.3 (L) 3.5 - 5.0 g/dL   AST 125 (H) 15 - 41 U/L   ALT 201 (H) 0 - 44 U/L   Alkaline Phosphatase 80 38 - 126 U/L   Total Bilirubin 0.6 0.3 - 1.2 mg/dL   GFR, Estimated >60  >60 mL/min   Anion gap 12 5 - 15  CBC     Status: Abnormal   Collection Time: 04/10/20  6:36 AM  Result Value Ref Range   WBC 5.3 4.0 - 10.5 K/uL   RBC 3.68 (L) 3.87 - 5.11 MIL/uL   Hemoglobin 11.5 (L) 12.0 - 15.0 g/dL   HCT 34.9 (L) 36 - 46 %   MCV 94.8 80.0 - 100.0 fL   MCH 31.3 26.0 - 34.0 pg   MCHC 33.0 30.0 - 36.0 g/dL   RDW 12.8 11.5 - 15.5 %   Platelets 157 150 - 400 K/uL   nRBC 0.0 0.0 - 0.2 %   Recent Results (from the past 240 hour(s))  Respiratory Panel by RT PCR (Flu A&B, Covid) - Nasopharyngeal Swab     Status: None   Collection Time: 04/10/20 12:17 AM   Specimen: Nasopharyngeal Swab  Result Value Ref Range Status   SARS Coronavirus 2 by RT PCR NEGATIVE NEGATIVE Final    Comment: (NOTE) SARS-CoV-2 target nucleic acids are NOT DETECTED.  The SARS-CoV-2 RNA is generally detectable in upper respiratoy specimens during the acute phase of infection. The lowest concentration of SARS-CoV-2 viral copies this assay can detect is 131 copies/mL. A negative result does not preclude SARS-Cov-2 infection and should not be used as  the sole basis for treatment or other patient management decisions. A negative result may occur with  improper specimen collection/handling, submission of specimen other than nasopharyngeal swab, presence of viral mutation(s) within the areas targeted by this assay, and inadequate number of viral copies (<131 copies/mL). A negative result must be combined with clinical observations, patient history, and epidemiological information. The expected result is Negative.  Fact Sheet for Patients:  PinkCheek.be  Fact Sheet for Healthcare Providers:  GravelBags.it  This test is no t yet approved or cleared by the [place] FDA and  has been authorized for detection and/or diagnosis of SARS-CoV-2 by FDA under an Emergency Use Authorization (EUA). This EUA will remain  in effect (meaning this  test can be used) for the duration of the COVID-19 declaration under Section 564(b)(1) of the Act, 21 U.S.C. section 360bbb-3(b)(1), unless the authorization is terminated or revoked sooner.     Influenza A by PCR NEGATIVE NEGATIVE Final   Influenza B by PCR NEGATIVE NEGATIVE Final    Comment: (NOTE) The Xpert Xpress SARS-CoV-2/FLU/RSV assay is intended as an aid in  the diagnosis of influenza from Nasopharyngeal swab specimens and  should not be used as a sole basis for treatment. Nasal washings and  aspirates are unacceptable for Xpert Xpress SARS-CoV-2/FLU/RSV  testing.  Fact Sheet for Patients: PinkCheek.be  Fact Sheet for Healthcare Providers: GravelBags.it  This test is not yet approved or cleared by the [place] FDA and  has been authorized for detection and/or diagnosis of SARS-CoV-2 by  FDA under an Emergency Use Authorization (EUA). This EUA will remain  in effect (meaning this test can be used) for the duration of the  Covid-19 declaration under Section 564(b)(1) of the Act, 21  U.S.C. section 360bbb-3(b)(1), unless the authorization is  terminated or revoked. Performed at [facility], [place] [address], [place], [place] [address]     Renal Function: Recent Labs    04/08/20 1812 04/09/20 2124 04/10/20 0636  CREATININE 0.89 0.96 0.95   Estimated Creatinine Clearance: 47.9 mL/min (by C-G formula based on SCr of 0.95 mg/dL).  Radiologic Imaging: CT L-SPINE NO CHARGE  Result Date: 04/08/2020 CLINICAL DATA:  Flank and back pain for 3 days. EXAM: CT LUMBAR SPINE WITHOUT CONTRAST TECHNIQUE: Multidetector CT imaging of the lumbar spine was performed without intravenous contrast administration. Multiplanar CT image reconstructions were also generated. COMPARISON:  Concurrent CT renal stone. FINDINGS: Segmentation: Transitional lumbosacral anatomy with S1 lumbarization. Alignment:  Preservation of lordosis.  Slight lumbar levocurvature. Vertebrae: No fracture or suspicious osseous lesion. Vertebral body heights are preserved. Mild multilevel degenerative changes most prominent at the T11-12 level. Paraspinal and other soft tissues: Paraspinal soft tissues within normal limits. Sequela of right nephrectomy. Intra-abdominal findings including left hydronephrosis are further detailed on concurrent CT renal stone. Disc levels: Patent bony spinal canal.  Patent bony neural foramina. IMPRESSION: No acute osseous abnormality. Mild spondylosis. Partially imaged left hydronephrosis. Please see concurrent CT renal stone for additional findings. Electronically Signed   By: Primitivo Gauze M.D.   On: 04/08/2020 18:10   CT Renal Stone Study  Result Date: 04/08/2020 CLINICAL DATA:  Left-sided flank pain EXAM: CT ABDOMEN AND PELVIS WITHOUT CONTRAST TECHNIQUE: Multidetector CT imaging of the abdomen and pelvis was performed following the standard protocol without IV contrast. COMPARISON:  None. FINDINGS: Lower chest: The visualized heart size within normal limits. No pericardial fluid/thickening. No hiatal hernia. The visualized portions of the lungs are clear. Hepatobiliary: Although limited due to  the lack of intravenous contrast, normal in appearance without gross focal abnormality. No evidence of calcified gallstones or biliary ductal dilatation. Pancreas:  Unremarkable.  No surrounding inflammatory changes. Spleen: Normal in size. Although limited due to the lack of intravenous contrast, normal in appearance. Adrenals/Urinary Tract: Both adrenal glands appear normal. The patient is status post right-sided nephrectomy. Left-sided pelvicaliectasis is seen to the UPJ. No renal or collecting system calculi are noted. The bladder is unremarkable. Stomach/Bowel: The stomach, small bowel, and colon are normal in appearance. No inflammatory changes or obstructive findings. A moderate amount of colonic is  throughout. Vascular/Lymphatic: There are no enlarged abdominal or pelvic lymph nodes. Scattered aortic within the distal aorta. Reproductive: The patient is status post hysterectomy. No adnexal masses or collections seen. Other: No evidence of abdominal wall mass or hernia. Musculoskeletal: No acute or significant osseous findings. IMPRESSION: Mild left hydronephrosis to the UPJ which could be from UPJ obstruction. No renal or collecting system calculi. Moderate amount stool without evidence of obstruction. Electronically Signed   By: Prudencio Pair M.D.   On: 04/08/2020 18:24    I independently reviewed the above imaging studies.  Impression/Recommendation: Kim Pennington is a 64yo woman with history of nephrolithiasis and solitary kidney (removed several years ago in the setting of what sounds like XGP or chronic hydronephrosis) who presents with left flank pain. Imaging with very mild hydronephrosis and what appears to be a narrowing at the level of the UPJ, no stones.  - Given that she is not infected, pain is relatively well controlled and kidney function is stable and at baseline, no acute urologic intervention indicated at this time - patient can be discharged with plans for close urologic follow up for continued evaluation (possibly lasix scan, ureteroscopy) and discussion regarding possible pyeloplasty. Urology will request follow up and patient instructed to call as well for appointment. - please provide pain meds at discharge - strict return precautions include signs or symptoms of infection   Kim Pennington 04/10/2020, 1:03 PM

## 2020-04-10 NOTE — H&P (Signed)
History and Physical    Kim Pennington ZOX:[id number] DOB: 06/09/1955 DOA: 04/09/2020  PCP: System, Provider Not In (Confirm with patient/family/NH records and if not entered, this has to be entered at [facility] point of entry) Patient coming from: Home  I have personally briefly reviewed patient's old medical records in [place]  Chief Complaint: Left flank pain  HPI: Kim Pennington is a 64 y.o. female with medical history significant of hypertension, hyperlipidemia and anxiety presented to ED for evaluation of worsening left-sided and low back pain. Patient states that she was seen in ER yesterday for the same problem and had a CT scan done that showed mild hydronephrosis and he was home with oxycodone. Patient also admitted of having few episodes of vomiting patient further mentioned that she tried to get an early appointment with urology but could not get an appointment. Patient otherwise denies fever, chills, dysuria, hematuria and urinary retention. Patient also denies chest pain, shortness of breath, abdominal pain, diarrhea, constipation and anxiety. (For level 3, the HPI must include 4+ descriptors: Location, Quality, Severity, Duration, Timing, Context, modifying factors, associated signs/symptoms and/or status of 3+ chronic problems.)  (Please avoid self-populating past medical history here) (The initial 2-3 lines should be focused and good to copy and paste in the HPI section of the daily progress note).  ED Course: On arrival to ED patient had blood pressure of 111/80, temperature 97.7, heart rate 66, respiratory rate 15 and oxygen saturation 94% on room air. Blood work showed WBC 4.7, hemoglobin 13, sodium 136, potassium 4.3, BUN 17, creatinine 0.9, blood glucose 97. UA negative for UTI. Patient was managed with fentanyl and Dilaudid in the ED. ED physician contacted urology and they recommended follow-up on Monday or admission to the hospital for pain control and evaluation tomorrow.  Patient will be admitted to the hospital because of severe pain and will be seen by urology in the morning.  Review of Systems: As per HPI otherwise 10 point review of systems negative.  Unacceptable ROS statements: "10 systems reviewed," "Extensive" (without elaboration).  Acceptable ROS statements: "All others negative," "All others reviewed and are negative," and "All others unremarkable," with at [place] documented Can't double dip - if using for HPI can't use for ROS  Past Medical History:  Diagnosis Date  . Kidney disease, chronic, stage III (GFR 30-59 ml/min) (HCC)   . Pacemaker     Past Surgical History:  Procedure Laterality Date  . FOOT SURGERY    . NEPHRECTOMY    . PACEMAKER IMPLANT       reports that she has never smoked. She has never used smokeless tobacco. She reports that she does not drink alcohol and does not use drugs.  Allergies  Allergen Reactions  . Tape Hives    Tape adhesive long period of time     No family history on file. Reviewed but not pertinent Unacceptable: Noncontributory, unremarkable, or negative. Acceptable: (example)Family history negative for heart disease  Prior to Admission medications   Medication Sig Start Date End Date Taking? Authorizing Provider  alendronate (FOSAMAX) 70 MG tablet Take 70 mg by mouth once a week. 03/12/20  Yes [provider]  ALPRAZolam Duanne Moron) 1 MG tablet Take 0.5 mg by mouth 3 (three) times daily as needed. 03/12/20  Yes [provider]  amLODipine (NORVASC) 10 MG tablet Take 10 mg by mouth daily. 01/12/20  Yes [provider]  atorvastatin (LIPITOR) 80 MG tablet Take 80 mg by mouth daily. 03/12/20  Yes [provider]  budesonide (ENTOCORT EC) 3 MG 24 hr capsule TK 1 C PO QD IN THE MORNING FOR UP TO 3 MONTHS 08/20/18  Yes [provider]  Calcium Carbonate-Vit D-Min (CALCIUM 1200 PO) Take 1 tablet by mouth daily.   Yes [provider]  CRANBERRY-CALCIUM PO  Take 1 tablet by mouth daily.   Yes [provider]  Cyanocobalamin (VITAMIN B 12 PO) Take 1 tablet by mouth daily.   Yes [provider]  estradiol (ESTRACE) 0.5 MG tablet Take 0.5 mg by mouth daily. 01/12/20  Yes [provider]  Fluticasone-Salmeterol (WIXELA INHUB IN) Inhale 1 puff into the lungs 2 (two) times daily.   Yes [provider]  losartan (COZAAR) 25 MG tablet Take 25 mg by mouth daily. 02/11/20  Yes [provider]  montelukast (SINGULAIR) 10 MG tablet Take 10 mg by mouth daily. 11/21/19  Yes [provider]  oxyCODONE (ROXICODONE) 5 MG immediate release tablet Take 1 tablet (5 mg total) by mouth every 6 (six) hours as needed for severe pain. 04/08/20  Yes Drenda Freeze, MD  tamsulosin (FLOMAX) 0.4 MG CAPS capsule Take 1 capsule (0.4 mg total) by mouth daily. 04/08/20  Yes Drenda Freeze, MD  zolpidem (AMBIEN) 5 MG tablet Take 5 mg by mouth at bedtime. 02/12/20  Yes [provider]    Physical Exam: Vitals:   04/10/20 0230 04/10/20 0244 04/10/20 0345 04/10/20 0422  BP:  101/75 108/72 132/86  Pulse: 60 63 65 66  Resp:  16 16 14   Temp:      TempSrc:      SpO2: 99% 99% 98% 100%  Weight:      Height:        Constitutional: NAD, calm, comfortable Vitals:   04/10/20 0230 04/10/20 0244 04/10/20 0345 04/10/20 0422  BP:  101/75 108/72 132/86  Pulse: 60 63 65 66  Resp:  16 16 14   Temp:      TempSrc:      SpO2: 99% 99% 98% 100%  Weight:      Height:       Eyes: PERRL, lids and conjunctivae normal ENMT: Mucous membranes are moist. Posterior pharynx clear of any exudate or lesions.Normal dentition.  Neck: normal, supple, no masses, no thyromegaly Respiratory: clear to auscultation bilaterally, no wheezing, no crackles. Normal respiratory effort. No accessory muscle use.  Cardiovascular: Regular rate and rhythm, no murmurs / rubs / gallops. No extremity edema. 2+ pedal pulses. No carotid bruits.  Abdomen: no  tenderness, no masses palpated. No hepatosplenomegaly. Bowel sounds positive.  Musculoskeletal: no clubbing / cyanosis. No joint deformity upper and lower extremities. Good ROM, no contractures. Normal muscle tone.  Skin: no rashes, lesions, ulcers. No induration Neurologic: CN 2-12 grossly intact. Sensation intact, DTR normal. Strength 5/5 in all 4.  Psychiatric: Normal judgment and insight. Alert and oriented x 3. Normal mood.   (Anything < 9 systems with 2 bullets each down codes to level 1) (If patient refuses exam can't bill higher level) (Make sure to document decubitus ulcers present on admission -- if possible -- and whether patient has chronic indwelling catheter at time of admission)  Labs on Admission: I have personally reviewed following labs and imaging studies  CBC: Recent Labs  Lab 04/08/20 1812 04/09/20 2124  WBC 6.0 4.7  NEUTROABS 3.9 1.6*  HGB 14.8 13.0  HCT 43.9 39.0  MCV 92.2 93.5  PLT 210 010   Basic Metabolic Panel: Recent Labs  Lab 04/08/20 1812 04/09/20 2124  NA 135 136  K 4.2 4.3  CL 102 105  CO2 23 23  GLUCOSE 86 97  BUN 18 17  CREATININE 0.89 0.96  CALCIUM 9.2 8.8*   GFR: Estimated Creatinine Clearance: 47.4 mL/min (by C-G formula based on SCr of 0.96 mg/dL). Liver Function Tests: Recent Labs  Lab 04/08/20 1812  AST 179*  ALT 292*  ALKPHOS 110  BILITOT 0.8  PROT 7.7  ALBUMIN 4.3   No results for input(s): LIPASE, AMYLASE in the last 168 hours. No results for input(s): AMMONIA in the last 168 hours. Coagulation Profile: No results for input(s): INR, PROTIME in the last 168 hours. Cardiac Enzymes: No results for input(s): CKTOTAL, CKMB, CKMBINDEX, TROPONINI in the last 168 hours. BNP (last 3 results) No results for input(s): PROBNP in the last 8760 hours. HbA1C: No results for input(s): HGBA1C in the last 72 hours. CBG: No results for input(s): GLUCAP in the last 168 hours. Lipid Profile: No results for input(s): CHOL, HDL,  LDLCALC, TRIG, CHOLHDL, LDLDIRECT in the last 72 hours. Thyroid Function Tests: No results for input(s): TSH, T4TOTAL, FREET4, T3FREE, THYROIDAB in the last 72 hours. Anemia Panel: No results for input(s): VITAMINB12, FOLATE, FERRITIN, TIBC, IRON, RETICCTPCT in the last 72 hours. Urine analysis:    Component Value Date/Time   COLORURINE STRAW (A) 04/09/2020 2019   APPEARANCEUR CLEAR 04/09/2020 2019   LABSPEC 1.004 (L) 04/09/2020 2019   PHURINE 7.0 04/09/2020 2019   GLUCOSEU NEGATIVE 04/09/2020 2019   HGBUR NEGATIVE 04/09/2020 2019   BILIRUBINUR NEGATIVE 04/09/2020 2019   BILIRUBINUR negative 04/08/2020 1600   KETONESUR NEGATIVE 04/09/2020 2019   PROTEINUR NEGATIVE 04/09/2020 2019   UROBILINOGEN 0.2 04/08/2020 1600   NITRITE NEGATIVE 04/09/2020 2019   LEUKOCYTESUR TRACE (A) 04/09/2020 2019    Radiological Exams on Admission: CT L-SPINE NO CHARGE  Result Date: 04/08/2020 CLINICAL DATA:  Flank and back pain for 3 days. EXAM: CT LUMBAR SPINE WITHOUT CONTRAST TECHNIQUE: Multidetector CT imaging of the lumbar spine was performed without intravenous contrast administration. Multiplanar CT image reconstructions were also generated. COMPARISON:  Concurrent CT renal stone. FINDINGS: Segmentation: Transitional lumbosacral anatomy with S1 lumbarization. Alignment: Preservation of lordosis.  Slight lumbar levocurvature. Vertebrae: No fracture or suspicious osseous lesion. Vertebral body heights are preserved. Mild multilevel degenerative changes most prominent at the T11-12 level. Paraspinal and other soft tissues: Paraspinal soft tissues within normal limits. Sequela of right nephrectomy. Intra-abdominal findings including left hydronephrosis are further detailed on concurrent CT renal stone. Disc levels: Patent bony spinal canal.  Patent bony neural foramina. IMPRESSION: No acute osseous abnormality. Mild spondylosis. Partially imaged left hydronephrosis. Please see concurrent CT renal stone for  additional findings. Electronically Signed   By: Primitivo Gauze M.D.   On: 04/08/2020 18:10   CT Renal Stone Study  Result Date: 04/08/2020 CLINICAL DATA:  Left-sided flank pain EXAM: CT ABDOMEN AND PELVIS WITHOUT CONTRAST TECHNIQUE: Multidetector CT imaging of the abdomen and pelvis was performed following the standard protocol without IV contrast. COMPARISON:  None. FINDINGS: Lower chest: The visualized heart size within normal limits. No pericardial fluid/thickening. No hiatal hernia. The visualized portions of the lungs are clear. Hepatobiliary: Although limited due to the lack of intravenous contrast, normal in appearance without gross focal abnormality. No evidence of calcified gallstones or biliary ductal dilatation. Pancreas:  Unremarkable.  No surrounding inflammatory changes. Spleen: Normal in size. Although limited due to the lack of intravenous contrast, normal in appearance. Adrenals/Urinary Tract: Both  adrenal glands appear normal. The patient is status post right-sided nephrectomy. Left-sided pelvicaliectasis is seen to the UPJ. No renal or collecting system calculi are noted. The bladder is unremarkable. Stomach/Bowel: The stomach, small bowel, and colon are normal in appearance. No inflammatory changes or obstructive findings. A moderate amount of colonic is throughout. Vascular/Lymphatic: There are no enlarged abdominal or pelvic lymph nodes. Scattered aortic within the distal aorta. Reproductive: The patient is status post hysterectomy. No adnexal masses or collections seen. Other: No evidence of abdominal wall mass or hernia. Musculoskeletal: No acute or significant osseous findings. IMPRESSION: Mild left hydronephrosis to the UPJ which could be from UPJ obstruction. No renal or collecting system calculi. Moderate amount stool without evidence of obstruction. Electronically Signed   By: Prudencio Pair M.D.   On: 04/08/2020 18:24    EKG: Independently reviewed.    Assessment/Plan Principal Problem:   Left flank discomfort Continue pain management with pain medications according to the pain scale as needed. Urology will evaluate the patient in the morning.  Active Problems:   H/O right nephrectomy Patient is s/p right nephrectomy  Hypertension Pressure stable. Continue home medications.  Hyperlipidemia Continue home medication  Anxiety Denies any anxiety at this time. Continue home as needed anxiety medication.  (please populate well all problems here in Problem List. (For example, if patient is on BP meds at home and you resume or decide to hold them, it is a problem that needs to be her. Same for CAD, COPD, HLD and so on)    DVT prophylaxis: Lovenox Code Status: Full code Family Communication: Disposition Plan: Consults called: Urology Admission status: Observation/MedSurg   Edmonia Lynch MD [facility] Pager [phone]   If 7PM-7AM, please contact night-coverage [email or web address] Password [facility]  04/10/2020, 4:54 AM

## 2020-04-10 NOTE — ED Provider Notes (Signed)
[place] DEPT Provider Note   CSN: [id number] Arrival date & time: 04/09/20  2002     History Chief Complaint  Patient presents with  . Flank Pain    Kim Pennington is a 64 y.o. female.  HPI Patient presents with left-sided flank and low back pain.  Severe.  States it feels like previous kidney stones that she has had.  Also has had some pain on the side previously.  Has had pain for around 4 days now.  Seen in the ER yesterday and had CT scan that showed some mild hydronephrosis.  However has a solitary kidney on the left due to previous kidney stones.  No relief with the oxycodone given yesterday.  Also has had nausea and vomiting.  States she was attempting to follow-up with urology here but states they could not get her in because she did not have a proper referral.  States that her insurance representative said she needed to come to the ER to get it figured out.  No fevers.  States has stage III kidney disease    Past Medical History:  Diagnosis Date  . Kidney disease, chronic, stage III (GFR 30-59 ml/min) (HCC)   . Pacemaker     There are no problems to display for this patient.   Past Surgical History:  Procedure Laterality Date  . FOOT SURGERY    . NEPHRECTOMY    . PACEMAKER IMPLANT       OB History   No obstetric history on file.     No family history on file.  Social History   Tobacco Use  . Smoking status: Never Smoker  . Smokeless tobacco: Never Used  Vaping Use  . Vaping Use: Never used  Substance Use Topics  . Alcohol use: Never  . Drug use: Never    Home Medications Prior to Admission medications   Medication Sig Start Date End Date Taking? Authorizing Provider  oxyCODONE (ROXICODONE) 5 MG immediate release tablet Take 1 tablet (5 mg total) by mouth every 6 (six) hours as needed for severe pain. 04/08/20   Drenda Freeze, MD  tamsulosin (FLOMAX) 0.4 MG CAPS capsule Take 1 capsule (0.4 mg total) by mouth  daily. 04/08/20   Drenda Freeze, MD    Allergies    Patient has no known allergies.  Review of Systems   Review of Systems  Constitutional: Positive for appetite change. Negative for fatigue and fever.  HENT: Negative for congestion.   Respiratory: Negative for shortness of breath.   Cardiovascular: Negative for chest pain.  Gastrointestinal: Positive for abdominal pain and nausea.  Genitourinary: Positive for flank pain. Negative for frequency and hematuria.  Musculoskeletal: Negative for back pain.  Skin: Negative for rash.  Neurological: Negative for weakness.  Psychiatric/Behavioral: Negative for confusion.    Physical Exam Updated Vital Signs BP 111/80   Pulse 66   Temp 97.7 F (36.5 C) (Oral)   Resp 15   Ht 5\' 2"  (1.575 m)   Wt 56.7 kg   SpO2 94%   BMI 22.86 kg/m   Physical Exam Vitals and nursing note reviewed.  Constitutional:      Comments: Patient appears uncomfortable.  HENT:     Head: Atraumatic.  Eyes:     Extraocular Movements: Extraocular movements intact.     Pupils: Pupils are equal, round, and reactive to light.  Cardiovascular:     Rate and Rhythm: Regular rhythm.  Pulmonary:     Effort: Pulmonary effort  is normal.     Breath sounds: Normal breath sounds.  Abdominal:     Tenderness: There is no abdominal tenderness. There is no rebound.  Genitourinary:    Comments: No CVA tenderness. Musculoskeletal:        General: No tenderness.     Cervical back: Neck supple.  Skin:    General: Skin is warm.     Capillary Refill: Capillary refill takes less than 2 seconds.  Neurological:     Mental Status: She is alert and oriented to person, place, and time.     ED Results / Procedures / Treatments   Labs (all labs ordered are listed, but only abnormal results are displayed) Labs Reviewed  URINALYSIS, ROUTINE W REFLEX MICROSCOPIC - Abnormal; Notable for the following components:      Result Value   Color, Urine STRAW (*)    Specific  Gravity, Urine 1.004 (*)    Leukocytes,Ua TRACE (*)    All other components within normal limits  BASIC METABOLIC PANEL - Abnormal; Notable for the following components:   Calcium 8.8 (*)    All other components within normal limits  CBC WITH DIFFERENTIAL/PLATELET - Abnormal; Notable for the following components:   Neutro Abs 1.6 (*)    All other components within normal limits    EKG None  Radiology CT L-SPINE NO CHARGE  Result Date: 04/08/2020 CLINICAL DATA:  Flank and back pain for 3 days. EXAM: CT LUMBAR SPINE WITHOUT CONTRAST TECHNIQUE: Multidetector CT imaging of the lumbar spine was performed without intravenous contrast administration. Multiplanar CT image reconstructions were also generated. COMPARISON:  Concurrent CT renal stone. FINDINGS: Segmentation: Transitional lumbosacral anatomy with S1 lumbarization. Alignment: Preservation of lordosis.  Slight lumbar levocurvature. Vertebrae: No fracture or suspicious osseous lesion. Vertebral body heights are preserved. Mild multilevel degenerative changes most prominent at the T11-12 level. Paraspinal and other soft tissues: Paraspinal soft tissues within normal limits. Sequela of right nephrectomy. Intra-abdominal findings including left hydronephrosis are further detailed on concurrent CT renal stone. Disc levels: Patent bony spinal canal.  Patent bony neural foramina. IMPRESSION: No acute osseous abnormality. Mild spondylosis. Partially imaged left hydronephrosis. Please see concurrent CT renal stone for additional findings. Electronically Signed   By: Primitivo Gauze M.D.   On: 04/08/2020 18:10   CT Renal Stone Study  Result Date: 04/08/2020 CLINICAL DATA:  Left-sided flank pain EXAM: CT ABDOMEN AND PELVIS WITHOUT CONTRAST TECHNIQUE: Multidetector CT imaging of the abdomen and pelvis was performed following the standard protocol without IV contrast. COMPARISON:  None. FINDINGS: Lower chest: The visualized heart size within normal  limits. No pericardial fluid/thickening. No hiatal hernia. The visualized portions of the lungs are clear. Hepatobiliary: Although limited due to the lack of intravenous contrast, normal in appearance without gross focal abnormality. No evidence of calcified gallstones or biliary ductal dilatation. Pancreas:  Unremarkable.  No surrounding inflammatory changes. Spleen: Normal in size. Although limited due to the lack of intravenous contrast, normal in appearance. Adrenals/Urinary Tract: Both adrenal glands appear normal. The patient is status post right-sided nephrectomy. Left-sided pelvicaliectasis is seen to the UPJ. No renal or collecting system calculi are noted. The bladder is unremarkable. Stomach/Bowel: The stomach, small bowel, and colon are normal in appearance. No inflammatory changes or obstructive findings. A moderate amount of colonic is throughout. Vascular/Lymphatic: There are no enlarged abdominal or pelvic lymph nodes. Scattered aortic within the distal aorta. Reproductive: The patient is status post hysterectomy. No adnexal masses or collections seen. Other: No evidence of  abdominal wall mass or hernia. Musculoskeletal: No acute or significant osseous findings. IMPRESSION: Mild left hydronephrosis to the UPJ which could be from UPJ obstruction. No renal or collecting system calculi. Moderate amount stool without evidence of obstruction. Electronically Signed   By: Prudencio Pair M.D.   On: 04/08/2020 18:24    Procedures Procedures (including critical care time)  Medications Ordered in ED Medications  fentaNYL (SUBLIMAZE) injection 100 mcg (has no administration in time range)  HYDROmorphone (DILAUDID) injection 1 mg (1 mg Intravenous Given 04/09/20 2140)  ondansetron (ZOFRAN) injection 4 mg (4 mg Intravenous Given 04/09/20 2139)  fentaNYL (SUBLIMAZE) injection 100 mcg (100 mcg Intravenous Given 04/09/20 2241)    ED Course  I have reviewed the triage vital signs and the nursing  notes.  Pertinent labs & imaging results that were available during my care of the patient were reviewed by me and considered in my medical decision making (see chart for details).    MDM Rules/Calculators/A&P                         Patient with left flank pain.  Appears that she does have a history of same but states it is not usually like this.  Had history of kidney stones on the right that required removal of the kidney.  Has had reviewing records a previous mild hydronephrosis on the left.  Also has hydronephrosis on CT scan yesterday.  However continued pain uncontrolled with oral medications.  Continues to vomit.  Fortunately kidney function still looks good.  Discussed with urology.  Either could be expedited follow-up on Monday or admission to the hospital for pain control and evaluation tomorrow.  With continued pain unrelieved by IV narcotics here will admit to hospitalist. Final Clinical Impression(s) / ED Diagnoses Final diagnoses:  Left flank pain    Rx / DC Orders ED Discharge Orders    None       Davonna Belling, MD 04/10/20 0007

## 2020-05-12 DIAGNOSIS — N130 Hydronephrosis with ureteropelvic junction obstruction: Secondary | ICD-10-CM | POA: Diagnosis not present

## 2020-05-14 ENCOUNTER — Telehealth: Payer: Self-pay | Admitting: Emergency Medicine

## 2020-05-14 NOTE — Telephone Encounter (Signed)
LMOM to call DC . Initial consult with Dr [name] 05/19/20. Need info of pacemaker to set up industry to cover.

## 2020-05-17 NOTE — Telephone Encounter (Signed)
Pt called and gave me her device model and serial number. She also provided the leads model/serial numbers.

## 2020-05-19 ENCOUNTER — Ambulatory Visit
Admission: RE | Admit: 2020-05-19 | Discharge: 2020-05-19 | Disposition: A | Discharge status: Home / Self Care | Payer: Medicare Other | Source: Ambulatory Visit | Attending: Cardiology | Admitting: Cardiology

## 2020-05-19 ENCOUNTER — Other Ambulatory Visit: Payer: Self-pay

## 2020-05-19 ENCOUNTER — Encounter: Payer: Self-pay | Admitting: Cardiology

## 2020-05-19 ENCOUNTER — Ambulatory Visit (INDEPENDENT_AMBULATORY_CARE_PROVIDER_SITE_OTHER): Payer: Medicare Other | Admitting: Cardiology

## 2020-05-19 VITALS — BP 110/72 | HR 78 | Ht 62.0 in | Wt 127.0 lb

## 2020-05-19 DIAGNOSIS — Z950 Presence of cardiac pacemaker: Secondary | ICD-10-CM | POA: Diagnosis not present

## 2020-05-19 DIAGNOSIS — R55 Syncope and collapse: Secondary | ICD-10-CM

## 2020-05-19 MED ORDER — LOSARTAN POTASSIUM 25 MG PO TABS: 25.0000 mg | ORAL_TABLET | Freq: Every day | ORAL | 1 refills | Status: DC

## 2020-05-19 MED ORDER — ATORVASTATIN CALCIUM 80 MG PO TABS: 80.0000 mg | ORAL_TABLET | Freq: Every day | ORAL | 1 refills | Status: DC

## 2020-05-19 MED ORDER — AMLODIPINE BESYLATE 10 MG PO TABS: 10.0000 mg | ORAL_TABLET | Freq: Every day | ORAL | 1 refills | Status: DC

## 2020-05-19 NOTE — Progress Notes (Signed)
Electrophysiology Office Note:    Date:  05/19/2020   ID:  Kim Pennington, DOB 05/09/1956, MRN [id number]  PCP:  Kim Noe, MD  [place] Cardiologist:  No primary care provider on file.  Kim Pennington Electrophysiologist:  Kim Epley, MD   Referring MD: No ref. provider found   Chief Complaint: Pacemaker in situ  History of Present Illness:    Kim Pennington is a 64 y.o. female who presents for an evaluation of permanent pacemaker. Their medical history includes CKD stage III, prior right nephrectomy and syncope.  She was recently seen in the emergency department April 08, 2020 for left flank pain and was found to have left-sided hydronephrosis consistent with a possible kidney stone.  Outpatient urology follow-up was arranged.  History today was obtained from the patient.  She is recently moved here from [facility] with her husband to be closer to their daughter.  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  She tells me that she has a history of syncope for which she had a loop recorder implanted around 2015.  Apparently very soon after having the loop recorder implanted there is an abnormality which prompted a permanent pacemaker to be placed.  She tells me that after the permanent pacemaker was implanted in 2015 she has not had any recurrent syncopal episodes.  Past Medical History:  Diagnosis Date  . Kidney disease, chronic, stage III (GFR 30-59 ml/min) (HCC)   . Pacemaker     Past Surgical History:  Procedure Laterality Date  . FOOT SURGERY    . NEPHRECTOMY    . PACEMAKER IMPLANT      Current Medications: Current Meds  Medication Sig  . alendronate (FOSAMAX) 70 MG tablet Take 70 mg by mouth once a week.  . ALPRAZolam (XANAX) 1 MG tablet Take 0.5 mg by mouth 3 (three) times daily as needed.  . budesonide (ENTOCORT EC) 3 MG 24 hr capsule Take 3 mg by mouth as needed.  . Calcium Carbonate-Vit D-Min (CALCIUM 1200 PO) Take 1 tablet by  mouth daily.  Marland Kitchen CRANBERRY-CALCIUM PO Take 1 tablet by mouth daily.  . Cyanocobalamin (VITAMIN B 12 PO) Take 1 tablet by mouth daily.  Marland Kitchen estradiol (ESTRACE) 0.5 MG tablet Take 0.5 mg by mouth daily.  . Fluticasone-Salmeterol (WIXELA INHUB IN) Inhale 1 puff into the lungs 2 (two) times daily.  . montelukast (SINGULAIR) 10 MG tablet Take 10 mg by mouth daily.  . tamsulosin (FLOMAX) 0.4 MG CAPS capsule Take 1 capsule (0.4 mg total) by mouth daily.  Marland Kitchen zolpidem (AMBIEN) 5 MG tablet Take 5 mg by mouth at bedtime.  . [DISCONTINUED] amLODipine (NORVASC) 10 MG tablet Take 10 mg by mouth daily.  . [DISCONTINUED] atorvastatin (LIPITOR) 80 MG tablet Take 80 mg by mouth daily.  . [DISCONTINUED] losartan (COZAAR) 25 MG tablet Take 25 mg by mouth daily.     Allergies:   Tape   Social History   Socioeconomic History  . Marital status: Married    Spouse name: Not on file  . Number of children: Not on file  . Years of education: Not on file  . Highest education level: Not on file  Occupational History  . Not on file  Tobacco Use  . Smoking status: Never Smoker  . Smokeless tobacco: Never Used  Vaping Use  . Vaping Use: Never used  Substance and Sexual Activity  . Alcohol use: Never  . Drug use: Never  . Sexual activity: Not  on file  Other Topics Concern  . Not on file  Social History Narrative  . Not on file   Social Determinants of Health   Financial Resource Strain: Not on file  Food Insecurity: Not on file  Transportation Needs: Not on file  Physical Activity: Not on file  Stress: Not on file  Social Connections: Not on file     Family History: The patient's family history includes Breast cancer in her sister; Diabetes in her sister; Hyperlipidemia in her brother, brother, sister, sister, sister, and sister; Hypertension in her brother, brother, sister, sister, sister, and sister.  ROS:   Please see the history of present illness.    All other systems reviewed and are  negative.  EKGs/Labs/Other Studies Reviewed:    The following studies were reviewed today: Prior records, and clinic device interrogation  EKG:  The ekg ordered today demonstrates normal sinus rhythm, low voltage QRS  May 19, 2020 in clinic device interrogation personally reviewed Presenting rhythm normal sinus rhythm 87 bpm Battery longevity 9.2 to 11.5 years Atrial lead impedance 380 ohms, sensing 0.8 to 1 mV, capture threshold 0.7 V at 0.5 ms Ventricular lead impedance 600 ohms, sensing 8.8 mV, capture threshold 0.5 V at 0.5 ms No tachycardia episodes Atrially pacing 14% DDD R 60-1 20  Recent Labs: 04/10/2020: ALT 201; BUN 14; Creatinine, Ser 0.95; Hemoglobin 11.5; Platelets 157; Potassium 3.8; Sodium 135  Recent Lipid Panel No results found for: CHOL, TRIG, HDL, CHOLHDL, VLDL, LDLCALC, LDLDIRECT  Physical Exam:    VS:  BP 110/72 (BP Location: Right Arm, Patient Position: Sitting, Cuff Size: Large)   Pulse 78   Ht 5\' 2"  (1.575 m)   Wt 127 lb (57.6 kg)   SpO2 97%   BMI 23.23 kg/m     Wt Readings from Last 3 Encounters:  05/19/20 127 lb (57.6 kg)  04/09/20 125 lb (56.7 kg)  04/08/20 127 lb (57.6 kg)     GEN:  Well nourished, well developed in no acute distress HEENT: Normal NECK: No JVD; No carotid bruits LYMPHATICS: No lymphadenopathy CARDIAC: RRR, no murmurs, rubs, gallops.  Device pocket well-healed. RESPIRATORY:  Clear to auscultation without rales, wheezing or rhonchi  ABDOMEN: Soft, non-tender, non-distended MUSCULOSKELETAL:  No edema; No deformity  SKIN: Warm and dry NEUROLOGIC:  Alert and oriented x 3 PSYCHIATRIC:  Normal affect   ASSESSMENT:    1. Cardiac pacemaker in situ   2. Syncope and collapse    PLAN:    In order of problems listed above:  1. Syncope and collapse post permanent pacemaker implant Doing well after her pacemaker was implanted in 2015.  Device interrogation shows normal lead parameters and good longevity.  We will establish  her in our device clinic.  I will obtain a 2 view chest x-ray to document the position of the leads and obtain an echocardiogram given her history of syncope and possible history of valve disease.  We will plan on seeing her back in clinic in 1 year or sooner as needed.   Medication Adjustments/Labs and Tests Ordered: Current medicines are reviewed at length with the patient today.  Concerns regarding medicines are outlined above.  Orders Placed This Encounter  Procedures  . DG Chest 2 View  . EKG 12-Lead  . ECHOCARDIOGRAM COMPLETE   Meds ordered this encounter  Medications  . amLODipine (NORVASC) 10 MG tablet    Sig: Take 1 tablet (10 mg total) by mouth daily.    Dispense:  90 tablet  Refill:  1  . atorvastatin (LIPITOR) 80 MG tablet    Sig: Take 1 tablet (80 mg total) by mouth daily.    Dispense:  90 tablet    Refill:  1  . losartan (COZAAR) 25 MG tablet    Sig: Take 1 tablet (25 mg total) by mouth daily.    Dispense:  90 tablet    Refill:  1     Signed, Lars Mage, MD, [facility]  05/19/2020 2:55 PM    Electrophysiology [place]

## 2020-05-19 NOTE — Patient Instructions (Addendum)
Medication Instructions:  - Your physician recommends that you continue on your current medications as directed. Please refer to the Current Medication list given to you today.  - Your cardiac medications have been refilled today  *If you need a refill on your cardiac medications before your next appointment, please call your pharmacy*   Lab Work: - none ordered  If you have labs (blood work) drawn today and your tests are completely normal, you will receive your results only by: Marland Kitchen MyChart Message (if you have MyChart) OR . A paper copy in the mail If you have any lab test that is abnormal or we need to change your treatment, we will call you to review the results.   Testing/Procedures: 1) Chest X-ray - A chest x-ray takes a picture of the organs and structures inside the chest, including the heart, lungs, and blood vessels. This test can show several things, including, whether the heart is enlarges; whether fluid is building up in the lungs; and whether pacemaker / defibrillator leads are still in place.   2) Echocardiogram - Your physician has requested that you have an echocardiogram. Echocardiography is a painless test that uses sound waves to create images of your heart. It provides your doctor with information about the size and shape of your heart and how well your heart's chambers and valves are working. This procedure takes approximately one hour. There are no restrictions for this procedure. There is a possibility that an IV may need to be started during your test to inject an image enhancing agent. This is done to obtain more optimal pictures of your heart. Therefore we ask that you do at least drink some water prior to coming in to hydrate your veins.     Follow-Up: At [facility], you and your health needs are our priority.  As part of our continuing mission to provide you with exceptional heart care, we have created designated Provider Care Teams.  These Care Teams  include your primary Cardiologist (physician) and Advanced Practice Providers (APPs -  Physician Assistants and Nurse Practitioners) who all work together to provide you with the care you need, when you need it.  We recommend signing up for the patient portal called "MyChart".  Sign up information is provided on this After Visit Summary.  MyChart is used to connect with patients for Virtual Visits (Telemedicine).  Patients are able to view lab/test results, encounter notes, upcoming appointments, etc.  Non-urgent messages can be sent to your provider as well.   To learn more about what you can do with MyChart, go to [email or web address].    Your next appointment:   1 year   The format for your next appointment:   In Person  Provider:   Lars Mage, MD   Other Instructions   Echocardiogram An echocardiogram is a procedure that uses painless sound waves (ultrasound) to produce an image of the heart. Images from an echocardiogram can provide important information about:  Signs of coronary artery disease (CAD).  Aneurysm detection. An aneurysm is a weak or damaged part of an artery wall that bulges out from the normal force of blood pumping through the body.  Heart size and shape. Changes in the size or shape of the heart can be associated with certain conditions, including heart failure, aneurysm, and CAD.  Heart muscle function.  Heart valve function.  Signs of a past heart attack.  Fluid buildup around the heart.  Thickening of the heart muscle.  A tumor or  infectious growth around the heart valves. Tell a health care provider about:  Any allergies you have.  All medicines you are taking, including vitamins, herbs, eye drops, creams, and over-the-counter medicines.  Any blood disorders you have.  Any surgeries you have had.  Any medical conditions you have.  Whether you are pregnant or may be pregnant. What are the risks? Generally, this is a safe procedure.  However, problems may occur, including:  Allergic reaction to dye (contrast) that may be used during the procedure. What happens before the procedure? No specific preparation is needed. You may eat and drink normally. What happens during the procedure?   An IV tube may be inserted into one of your veins.  You may receive contrast through this tube. A contrast is an injection that improves the quality of the pictures from your heart.  A gel will be applied to your chest.  A wand-like tool (transducer) will be moved over your chest. The gel will help to transmit the sound waves from the transducer.  The sound waves will harmlessly bounce off of your heart to allow the heart images to be captured in real-time motion. The images will be recorded on a computer. The procedure may vary among health care providers and hospitals. What happens after the procedure?  You may return to your normal, everyday life, including diet, activities, and medicines, unless your health care provider tells you not to do that. Summary  An echocardiogram is a procedure that uses painless sound waves (ultrasound) to produce an image of the heart.  Images from an echocardiogram can provide important information about the size and shape of your heart, heart muscle function, heart valve function, and fluid buildup around your heart.  You do not need to do anything to prepare before this procedure. You may eat and drink normally.  After the echocardiogram is completed, you may return to your normal, everyday life, unless your health care provider tells you not to do that. This information is not intended to replace advice given to you by your health care provider. Make sure you discuss any questions you have with your health care provider. Document Revised: 09/12/2018 Document Reviewed: 06/24/2016 Elsevier Patient Education  [place].

## 2020-06-17 ENCOUNTER — Other Ambulatory Visit: Payer: Medicare Other

## 2020-06-22 DIAGNOSIS — N130 Hydronephrosis with ureteropelvic junction obstruction: Secondary | ICD-10-CM | POA: Diagnosis not present

## 2020-06-29 ENCOUNTER — Encounter: Payer: Self-pay | Admitting: Family Medicine

## 2020-06-29 ENCOUNTER — Other Ambulatory Visit: Payer: Self-pay

## 2020-06-29 ENCOUNTER — Ambulatory Visit (INDEPENDENT_AMBULATORY_CARE_PROVIDER_SITE_OTHER): Payer: Medicare Other | Admitting: Family Medicine

## 2020-06-29 VITALS — BP 110/70 | HR 82 | Temp 97.5°F | Ht 62.5 in | Wt 129.0 lb

## 2020-06-29 DIAGNOSIS — M79673 Pain in unspecified foot: Secondary | ICD-10-CM | POA: Diagnosis not present

## 2020-06-29 DIAGNOSIS — M419 Scoliosis, unspecified: Secondary | ICD-10-CM

## 2020-06-29 DIAGNOSIS — K52832 Lymphocytic colitis: Secondary | ICD-10-CM

## 2020-06-29 DIAGNOSIS — F5104 Psychophysiologic insomnia: Secondary | ICD-10-CM

## 2020-06-29 DIAGNOSIS — Z79890 Hormone replacement therapy (postmenopausal): Secondary | ICD-10-CM | POA: Diagnosis not present

## 2020-06-29 DIAGNOSIS — M810 Age-related osteoporosis without current pathological fracture: Secondary | ICD-10-CM

## 2020-06-29 DIAGNOSIS — N1831 Chronic kidney disease, stage 3a: Secondary | ICD-10-CM

## 2020-06-29 DIAGNOSIS — I10 Essential (primary) hypertension: Secondary | ICD-10-CM

## 2020-06-29 DIAGNOSIS — G8929 Other chronic pain: Secondary | ICD-10-CM

## 2020-06-29 DIAGNOSIS — E538 Deficiency of other specified B group vitamins: Secondary | ICD-10-CM

## 2020-06-29 MED ORDER — ALENDRONATE SODIUM 70 MG PO TABS: 70.0000 mg | ORAL_TABLET | ORAL | 3 refills | Status: DC

## 2020-06-29 MED ORDER — AMLODIPINE BESYLATE 10 MG PO TABS: 10.0000 mg | ORAL_TABLET | Freq: Every day | ORAL | 3 refills | Status: DC

## 2020-06-29 MED ORDER — ALPRAZOLAM 1 MG PO TABS: 0.5000 mg | ORAL_TABLET | Freq: Three times a day (TID) | ORAL | 0 refills | Status: DC | PRN

## 2020-06-29 MED ORDER — ATORVASTATIN CALCIUM 80 MG PO TABS: 80.0000 mg | ORAL_TABLET | Freq: Every day | ORAL | 3 refills | Status: DC

## 2020-06-29 MED ORDER — ESTRADIOL 0.5 MG PO TABS: 0.5000 mg | ORAL_TABLET | Freq: Every day | ORAL | 3 refills | Status: DC

## 2020-06-29 MED ORDER — ZOLPIDEM TARTRATE 5 MG PO TABS: 5.0000 mg | ORAL_TABLET | Freq: Every day | ORAL | 0 refills | Status: DC

## 2020-06-29 MED ORDER — MONTELUKAST SODIUM 10 MG PO TABS: 10.0000 mg | ORAL_TABLET | ORAL | 3 refills | Status: DC | PRN

## 2020-06-29 MED ORDER — LOSARTAN POTASSIUM 25 MG PO TABS: 25.0000 mg | ORAL_TABLET | Freq: Every day | ORAL | 3 refills | Status: DC

## 2020-06-29 MED ORDER — VITAMIN B-12 1000 MCG PO TABS: 1000.0000 ug | ORAL_TABLET | Freq: Every day | ORAL | 3 refills | Status: DC

## 2020-06-29 NOTE — Assessment & Plan Note (Signed)
Cont estradiol. Will discuss taper plan at follow-up

## 2020-06-29 NOTE — Patient Instructions (Addendum)
Consider contacting a professional therapist  -- [facility] is one option. Call [phone]  CBT-Insomnia    Return in about 1 month for controlled substance refill

## 2020-06-29 NOTE — Assessment & Plan Note (Signed)
Cont fosamax. Year 3/5. Obtain outside dexa

## 2020-06-29 NOTE — Assessment & Plan Note (Signed)
Likely some PTSD and sleep apnea. Not currently on sleep apnea treatment but pacemaker resolved so issues. Taking ambien and xanax 0.5 mg to help with sleep. Discussed trial of therapy with a goal of decreasing medication due to higher risk. May also consider insomnia referral and sleep apnea re-evaluation if no improvement.

## 2020-06-29 NOTE — Assessment & Plan Note (Signed)
Hx of surgery recently with recurrent severe pain. Referral to podiatry for evaluation

## 2020-06-29 NOTE — Assessment & Plan Note (Signed)
Controlled. Cont amlodipine 10 mg and losartan 25 mg

## 2020-06-29 NOTE — Assessment & Plan Note (Signed)
Controlled with regular exercise for pain management

## 2020-06-29 NOTE — Assessment & Plan Note (Signed)
Currently stable. Will refer to GI if flare or new symptoms

## 2020-06-29 NOTE — Assessment & Plan Note (Signed)
Cont losartan 25 mg.

## 2020-06-29 NOTE — Assessment & Plan Note (Signed)
Refill Vit B12. Cont supplement.
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
## 2020-07-01 ENCOUNTER — Encounter: Payer: Self-pay | Admitting: Podiatry

## 2020-07-01 ENCOUNTER — Ambulatory Visit (INDEPENDENT_AMBULATORY_CARE_PROVIDER_SITE_OTHER): Payer: Medicare Other

## 2020-07-01 ENCOUNTER — Other Ambulatory Visit: Payer: Self-pay

## 2020-07-01 ENCOUNTER — Ambulatory Visit: Payer: Medicare Other | Admitting: Podiatry

## 2020-07-01 DIAGNOSIS — M779 Enthesopathy, unspecified: Secondary | ICD-10-CM

## 2020-07-01 DIAGNOSIS — M7751 Other enthesopathy of right foot: Secondary | ICD-10-CM

## 2020-07-01 DIAGNOSIS — M7752 Other enthesopathy of left foot: Secondary | ICD-10-CM

## 2020-07-02 ENCOUNTER — Encounter: Payer: Self-pay | Admitting: Podiatry

## 2020-07-02 NOTE — Progress Notes (Signed)
Subjective:  Patient ID: Kim Pennington, female    DOB: [birth year],  MRN: [id number]  Chief Complaint  Patient presents with  . Foot Pain    Patient presents today for bilat feet pain x 1 month, left much worse than right    65 y.o. female presents with the above complaint.  Patient presents with complaint of bilateral foot pain.  She had surgery done in [place] that led to scarring on the top of the foot now that she has burning pins-and-needles sharp stabbing pain.  She has moved here recently.  She is try Voltaren gel icing compression none of which has helped.  She does not recall exactly what surgery they did.  On x-ray seems like there is some kind of anchors placed.  I discussed with her to bring the operative note during next clinical visit.  She states understanding.  She has complaining of bilateral capsulitis to both left and right side with left being greater than life.  She has not tried any injections.  Her left side is a lot worse than her right side is.  She does not have any immobilization device either.  She has not seen anyone else prior to seeing me for this.   Review of Systems: Negative except as noted in the HPI. Denies N/V/F/Ch.  Past Medical History:  Diagnosis Date  . Kidney disease, chronic, stage III (GFR 30-59 ml/min) (HCC)   . Pacemaker   . Renal failure, chronic, stage 3 (moderate) ([place]) 2015   Left kidney     Current Outpatient Medications:  .  alendronate (FOSAMAX) 70 MG tablet, Take 1 tablet (70 mg total) by mouth once a week., Disp: 12 tablet, Rfl: 3 .  ALPRAZolam (XANAX) 1 MG tablet, Take 0.5 tablets (0.5 mg total) by mouth 3 (three) times daily as needed., Disp: 30 tablet, Rfl: 0 .  amLODipine (NORVASC) 10 MG tablet, Take 1 tablet (10 mg total) by mouth daily., Disp: 90 tablet, Rfl: 3 .  atorvastatin (LIPITOR) 80 MG tablet, Take 1 tablet (80 mg total) by mouth daily., Disp: 90 tablet, Rfl: 3 .  budesonide (ENTOCORT EC) 3 MG 24 hr capsule, Take 3 mg by  mouth as needed., Disp: , Rfl:  .  Calcium Carbonate-Vit D-Min (CALCIUM 1200 PO), Take 1 tablet by mouth daily., Disp: , Rfl:  .  CRANBERRY-CALCIUM PO, Take 1 tablet by mouth daily., Disp: , Rfl:  .  estradiol (ESTRACE) 0.5 MG tablet, Take 1 tablet (0.5 mg total) by mouth daily., Disp: 90 tablet, Rfl: 3 .  losartan (COZAAR) 25 MG tablet, Take 1 tablet (25 mg total) by mouth daily., Disp: 90 tablet, Rfl: 3 .  vitamin B-12 (CYANOCOBALAMIN) 1000 MCG tablet, Take 1 tablet (1,000 mcg total) by mouth daily., Disp: 90 tablet, Rfl: 3 .  zolpidem (AMBIEN) 5 MG tablet, Take 1 tablet (5 mg total) by mouth at bedtime., Disp: 30 tablet, Rfl: 0  Social History   Tobacco Use  Smoking Status Never Smoker  Smokeless Tobacco Never Used    Allergies  Allergen Reactions  . Tape Hives    Tape adhesive long period of time    Objective:  There were no vitals filed for this visit. There is no height or weight on file to calculate BMI. Constitutional Well developed. Well nourished.  Vascular Dorsalis pedis pulses palpable bilaterally. Posterior tibial pulses palpable bilaterally. Capillary refill normal to all digits.  No cyanosis or clubbing noted. Pedal hair growth normal.  Neurologic Normal speech. Oriented to  person, place, and time. Epicritic sensation to light touch grossly present bilaterally.  Dermatologic Nails well groomed and normal in appearance. No open wounds. No skin lesions.  Orthopedic:  Bilateral second interspace neuroma versus capsulitis.  Positive Mulder's click.  Positive [place] sign left greater than right side.  Previous scarring/surgical scarring noted.   Radiographs: Bilateral second interspace: 3 views of skeletally mature adult bilateral foot: Previous hardware noted in the metatarsal second and third.  No sign of loosening or breaking noted.  There appear to be snap off screws.  Positive [place] sign noted on x-ray.  No elevatus noted.  Hardware appears  intact Assessment:   1. Capsulitis of metatarsophalangeal (MTP) joint of right foot   2. Capsulitis of metatarsophalangeal (MTP) joint of left foot    Plan:  Patient was evaluated and treated and all questions answered.  Bilateral second interspace neuroma/capsulitis left greater than right -I explained to patient the etiology of neuroma and capsulitis and various treatment options were discussed.  Given that patient has previous surgery done to both of the feet with associated scarring in the interspace of second I believe there is likely a lot of scar tissue/vibratory tissue that could be causing her pain.  I believe patient may benefit from a steroid injection help decrease the pain associated with inflammation.  Patient agrees with the plan would like to proceed with injection. -A steroid injection was performed at bilateral second MPJ using 1% plain Lidocaine and 10 mg of Kenalog. This was well tolerated. -Since the left side is worse than the right side I placed her in a surgical shoe to the left side.  No follow-ups on file.

## 2020-07-08 ENCOUNTER — Other Ambulatory Visit: Payer: Self-pay | Admitting: Podiatry

## 2020-07-08 DIAGNOSIS — M7752 Other enthesopathy of left foot: Secondary | ICD-10-CM

## 2020-07-08 DIAGNOSIS — M7751 Other enthesopathy of right foot: Secondary | ICD-10-CM

## 2020-07-15 DIAGNOSIS — Z20822 Contact with and (suspected) exposure to covid-19: Secondary | ICD-10-CM | POA: Diagnosis not present

## 2020-07-20 DIAGNOSIS — Z20822 Contact with and (suspected) exposure to covid-19: Secondary | ICD-10-CM | POA: Diagnosis not present

## 2020-07-21 ENCOUNTER — Other Ambulatory Visit: Payer: Medicare Other

## 2020-07-28 ENCOUNTER — Other Ambulatory Visit: Payer: Medicare Other

## 2020-08-10 ENCOUNTER — Other Ambulatory Visit: Payer: Self-pay

## 2020-08-10 ENCOUNTER — Ambulatory Visit (INDEPENDENT_AMBULATORY_CARE_PROVIDER_SITE_OTHER): Payer: Medicare Other | Admitting: Family Medicine

## 2020-08-10 ENCOUNTER — Encounter: Payer: Self-pay | Admitting: Family Medicine

## 2020-08-10 VITALS — BP 110/80 | HR 78 | Temp 97.3°F | Ht 63.0 in | Wt 129.5 lb

## 2020-08-10 DIAGNOSIS — Z79899 Other long term (current) drug therapy: Secondary | ICD-10-CM

## 2020-08-10 DIAGNOSIS — Z85820 Personal history of malignant melanoma of skin: Secondary | ICD-10-CM | POA: Diagnosis not present

## 2020-08-10 DIAGNOSIS — F5104 Psychophysiologic insomnia: Secondary | ICD-10-CM

## 2020-08-10 MED ORDER — ZOLPIDEM TARTRATE 5 MG PO TABS: 2.5000 mg | ORAL_TABLET | Freq: Every day | ORAL | 1 refills | Status: DC

## 2020-08-10 MED ORDER — ALPRAZOLAM 1 MG PO TABS: 1.0000 mg | ORAL_TABLET | Freq: Every evening | ORAL | 1 refills | Status: DC | PRN

## 2020-08-10 NOTE — Assessment & Plan Note (Signed)
Tried several regimens prior to this one. Poor sleep over the last few weeks 2/2 to unintentional reduction in xanax. Has not tried therapy - encouraged and phone number provided. Controlled substance contract for Ambien 2.5 mg nightly and xanax 1 mg nightly. UDS today. Return 6 months. Therapy with goal of trying to reduce to one agent.
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
## 2020-08-10 NOTE — Patient Instructions (Addendum)
#  Headache - try 1000 mg of tylenol - Get better sleep - if not improved - can try Riboflavin - follow-up sooner if no improvement  Consider therapy   Headache Prevention  Riboflavin has been shown to be effective in reducing the frequency of migraine headaches. This is a supplement that is safe and well tolerated. The only side effect is a change in urine color.   Take 400 mg of Riboflavin daily.   I recommend picking up a bottle and taking the medication until you finish the bottle. If it helped, continued. If no improvement or change, then do not purchase another bottle.

## 2020-08-10 NOTE — Assessment & Plan Note (Signed)
Local melanoma 2018 per report. Needs new dermatologist was going q6 months and is overdue. Regrowth of previously precancerous lesion which was removed.

## 2020-08-11 ENCOUNTER — Ambulatory Visit (INDEPENDENT_AMBULATORY_CARE_PROVIDER_SITE_OTHER): Payer: Medicare Other

## 2020-08-11 DIAGNOSIS — R55 Syncope and collapse: Secondary | ICD-10-CM | POA: Diagnosis not present

## 2020-08-11 LAB — ECHOCARDIOGRAM COMPLETE
Area-P 1/2: 4.29 cm2
S' Lateral: 2.6 cm

## 2020-08-12 ENCOUNTER — Ambulatory Visit (INDEPENDENT_AMBULATORY_CARE_PROVIDER_SITE_OTHER): Payer: Medicare Other | Admitting: Podiatry

## 2020-08-12 ENCOUNTER — Other Ambulatory Visit: Payer: Self-pay

## 2020-08-12 ENCOUNTER — Encounter: Payer: Self-pay | Admitting: Podiatry

## 2020-08-12 DIAGNOSIS — M7751 Other enthesopathy of right foot: Secondary | ICD-10-CM | POA: Diagnosis not present

## 2020-08-12 DIAGNOSIS — M778 Other enthesopathies, not elsewhere classified: Secondary | ICD-10-CM

## 2020-08-12 NOTE — Progress Notes (Signed)
Subjective:  Patient ID: Kim Pennington, female    DOB: 1955-06-30,  MRN: [id number]  Chief Complaint  Patient presents with  . Foot Pain    65 y.o. female presents with the above complaint.  Patient presents with bilateral foot pain from the surgery that she had done back in [place].  She states that she is still working on getting the operative note.  She states the pain is about the same.  The injection did help.  She would like to discuss 2 more injections.  She denies any other acute complaints.  Her pain has moved a little bit to the first tarsometatarsal joint on the left side as opposed to the interspace.  The pain on the right side is at the same spot.  She denies any other acute complaints.  Review of Systems: Negative except as noted in the HPI. Denies N/V/F/Ch.  Past Medical History:  Diagnosis Date  . Kidney disease, chronic, stage III (GFR 30-59 ml/min) (HCC)   . Pacemaker   . Renal failure, chronic, stage 3 (moderate) ([place]) 2015   Left kidney     Current Outpatient Medications:  .  alendronate (FOSAMAX) 70 MG tablet, Take 1 tablet (70 mg total) by mouth once a week., Disp: 12 tablet, Rfl: 3 .  ALPRAZolam (XANAX) 1 MG tablet, Take 1 tablet (1 mg total) by mouth at bedtime as needed., Disp: 90 tablet, Rfl: 1 .  amLODipine (NORVASC) 10 MG tablet, Take 1 tablet (10 mg total) by mouth daily., Disp: 90 tablet, Rfl: 3 .  atorvastatin (LIPITOR) 80 MG tablet, Take 1 tablet (80 mg total) by mouth daily., Disp: 90 tablet, Rfl: 3 .  budesonide (ENTOCORT EC) 3 MG 24 hr capsule, Take 3 mg by mouth as needed. (Patient not taking: Reported on 08/10/2020), Disp: , Rfl:  .  Calcium Carbonate-Vit D-Min (CALCIUM 1200 PO), Take 1 tablet by mouth daily., Disp: , Rfl:  .  CRANBERRY-CALCIUM PO, Take 1 tablet by mouth daily., Disp: , Rfl:  .  estradiol (ESTRACE) 0.5 MG tablet, Take 1 tablet (0.5 mg total) by mouth daily., Disp: 90 tablet, Rfl: 3 .  losartan (COZAAR) 25 MG tablet, Take 1 tablet (25 mg  total) by mouth daily., Disp: 90 tablet, Rfl: 3 .  montelukast (SINGULAIR) 10 MG tablet, Take 10 mg by mouth at bedtime., Disp: , Rfl:  .  vitamin B-12 (CYANOCOBALAMIN) 1000 MCG tablet, Take 1 tablet (1,000 mcg total) by mouth daily., Disp: 90 tablet, Rfl: 3 .  zolpidem (AMBIEN) 5 MG tablet, Take 0.5 tablets (2.5 mg total) by mouth at bedtime., Disp: 45 tablet, Rfl: 1  Social History   Tobacco Use  Smoking Status Never Smoker  Smokeless Tobacco Never Used    Allergies  Allergen Reactions  . Tape Hives    Tape adhesive long period of time    Objective:  There were no vitals filed for this visit. There is no height or weight on file to calculate BMI. Constitutional Well developed. Well nourished.  Vascular Dorsalis pedis pulses palpable bilaterally. Posterior tibial pulses palpable bilaterally. Capillary refill normal to all digits.  No cyanosis or clubbing noted. Pedal hair growth normal.  Neurologic Normal speech. Oriented to person, place, and time. Epicritic sensation to light touch grossly present bilaterally.  Dermatologic Nails well groomed and normal in appearance. No open wounds. No skin lesions.  Orthopedic:  Bilateral second interspace neuroma versus capsulitis.  Positive Mulder's click.  Positive [place] sign left greater than right side.  Previous  scarring/surgical scarring noted.  Pain on palpation to the left first tarsometatarsal joint.  Mild osteoarthritic changes were clinically palpated.  No pain with resisted dorsiflexion and plantarflexion of the digit ruling out tendinitis   Radiographs: Bilateral second interspace: 3 views of skeletally mature adult bilateral foot: Previous hardware noted in the metatarsal second and third.  No sign of loosening or breaking noted.  There appear to be snap off screws.  Positive [place] sign noted on x-ray.  No elevatus noted.  Hardware appears intact Assessment:   1. Capsulitis of metatarsophalangeal (MTP) joint of right  foot   2. Capsulitis of foot, left    Plan:  Patient was evaluated and treated and all questions answered.  Left first tarsometatarsal joint capsulitis -I explained the patient the etiology of capsulitis versus treatment options were discussed.  I believe patient will benefit from a steroid shot to decrease acute inflammatory component associated pain.  She will still work on getting the operative note for me to assess exactly what the surgical was done versus neuroma excision as opposed to osteotomies in the bone as are apparent radiographically.  If no improvement we will discuss getting an MRI during next clinical visit.  Bilateral second interspace neuroma/capsulitis left greater than right -I explained to patient the etiology of neuroma and capsulitis and various treatment options were discussed.  Given that patient has previous surgery done to both of the feet with associated scarring in the interspace of second I believe there is likely a lot of scar tissue/vibratory tissue that could be causing her pain.  I believe patient may benefit from a steroid injection help decrease the pain associated with inflammation.  Patient agrees with the plan would like to proceed with injection. -A steroid injection was performed at right second MPJ using 1% plain Lidocaine and 10 mg of Kenalog. This was well tolerated. -Since the left side is worse than the right side I placed her in a surgical shoe to the left side.  No follow-ups on file.

## 2020-08-13 LAB — DRUG MONITORING, PANEL 8 WITH CONFIRMATION, URINE
6 Acetylmorphine: NEGATIVE ng/mL (ref <10)
Alcohol Metabolites: NEGATIVE ng/mL
Alphahydroxyalprazolam: 49 ng/mL — ABNORMAL HIGH (ref <25)
Alphahydroxymidazolam: NEGATIVE ng/mL (ref <50)
Alphahydroxytriazolam: NEGATIVE ng/mL (ref <50)
Aminoclonazepam: NEGATIVE ng/mL (ref <25)
Amphetamines: NEGATIVE ng/mL (ref <500)
Benzodiazepines: POSITIVE ng/mL — AB (ref <100)
Buprenorphine, Urine: NEGATIVE ng/mL (ref <5)
Cocaine Metabolite: NEGATIVE ng/mL (ref <150)
Creatinine: 44.4 mg/dL
Hydroxyethylflurazepam: NEGATIVE ng/mL (ref <50)
Lorazepam: NEGATIVE ng/mL (ref <50)
MDMA: NEGATIVE ng/mL (ref <500)
Marijuana Metabolite: NEGATIVE ng/mL (ref <20)
Nordiazepam: NEGATIVE ng/mL (ref <50)
Opiates: NEGATIVE ng/mL (ref <100)
Oxazepam: NEGATIVE ng/mL (ref <50)
Oxidant: NEGATIVE ug/mL
Oxycodone: NEGATIVE ng/mL (ref <100)
Temazepam: NEGATIVE ng/mL (ref <50)
pH: 6.9 (ref 4.5–9.0)

## 2020-08-13 LAB — DM TEMPLATE

## 2020-08-16 ENCOUNTER — Telehealth: Payer: Self-pay | Admitting: Cardiology

## 2020-08-16 NOTE — Telephone Encounter (Signed)
I spoke with the pt and she states she will start using the monitors tonight.

## 2020-08-16 NOTE — Telephone Encounter (Signed)
Patient inquiring about remote pacemaker checks. Patient has Sayre pacemaker and the monitor is [employer] transmitters  Please advise

## 2020-08-18 ENCOUNTER — Ambulatory Visit (INDEPENDENT_AMBULATORY_CARE_PROVIDER_SITE_OTHER): Payer: Medicare Other

## 2020-08-18 DIAGNOSIS — R55 Syncope and collapse: Secondary | ICD-10-CM | POA: Diagnosis not present

## 2020-08-18 LAB — CUP PACEART REMOTE DEVICE CHECK
Battery Remaining Longevity: 123 mo
Battery Remaining Percentage: 95.5 %
Battery Voltage: 2.98 V
Brady Statistic AP VP Percent: 1 %
Brady Statistic AP VS Percent: 25 %
Brady Statistic AS VP Percent: 1 %
Brady Statistic AS VS Percent: 75 %
Brady Statistic RA Percent Paced: 25 %
Brady Statistic RV Percent Paced: 1 %
Date Time Interrogation Session: 20220315233553
Implantable Lead Implant Date: 20151120
Implantable Lead Implant Date: 20151120
Implantable Lead Location: 753862
Implantable Lead Location: 753862
Implantable Pulse Generator Implant Date: 20151120
Lead Channel Impedance Value: 390 Ohm
Lead Channel Impedance Value: 680 Ohm
Lead Channel Pacing Threshold Amplitude: 0.5 V
Lead Channel Pacing Threshold Amplitude: 0.75 V
Lead Channel Pacing Threshold Pulse Width: 0.5 ms
Lead Channel Pacing Threshold Pulse Width: 0.5 ms
Lead Channel Sensing Intrinsic Amplitude: 1.3 mV
Lead Channel Sensing Intrinsic Amplitude: 9.6 mV
Lead Channel Setting Pacing Amplitude: 2 V
Lead Channel Setting Pacing Amplitude: 2 V
Lead Channel Setting Pacing Pulse Width: 0.5 ms
Lead Channel Setting Sensing Sensitivity: 0.5 mV
Pulse Gen Model: 2240
Pulse Gen Serial Number: 7689719

## 2020-08-26 NOTE — Progress Notes (Signed)
Remote pacemaker transmission.   

## 2020-09-07 DIAGNOSIS — D2261 Melanocytic nevi of right upper limb, including shoulder: Secondary | ICD-10-CM | POA: Diagnosis not present

## 2020-09-07 DIAGNOSIS — D225 Melanocytic nevi of trunk: Secondary | ICD-10-CM | POA: Diagnosis not present

## 2020-09-07 DIAGNOSIS — D2262 Melanocytic nevi of left upper limb, including shoulder: Secondary | ICD-10-CM | POA: Diagnosis not present

## 2020-09-07 DIAGNOSIS — Z85828 Personal history of other malignant neoplasm of skin: Secondary | ICD-10-CM | POA: Diagnosis not present

## 2020-09-23 ENCOUNTER — Ambulatory Visit: Payer: Medicare Other | Admitting: Podiatry

## 2020-09-30 ENCOUNTER — Ambulatory Visit: Payer: Medicare Other | Admitting: Podiatry

## 2020-09-30 ENCOUNTER — Encounter: Payer: Self-pay | Admitting: Podiatry

## 2020-09-30 ENCOUNTER — Other Ambulatory Visit: Payer: Self-pay

## 2020-09-30 DIAGNOSIS — M7751 Other enthesopathy of right foot: Secondary | ICD-10-CM

## 2020-09-30 DIAGNOSIS — M7752 Other enthesopathy of left foot: Secondary | ICD-10-CM

## 2020-10-05 ENCOUNTER — Encounter: Payer: Self-pay | Admitting: Podiatry

## 2020-10-05 NOTE — Progress Notes (Signed)
Subjective:  Patient ID: Kim Pennington, female    DOB: 1955-10-24,  MRN: [id number]  Chief Complaint  Patient presents with  . Foot Pain    They are no better.  My left foot feels like broken glass in side and they are very painful at night.  Feels like pins and needles, fire and ice"    65 y.o. female presents with the above complaint.  Patient presents with bilateral foot pain from the surgery that she had done back in [place].  She states that she since the operative note and.  However we did not get it.  Patient states it feels like pins and needle.  She states the injection helped a little bit.  She would like to do another injection.  She would like to get an MRI.  Review of Systems: Negative except as noted in the HPI. Denies N/V/F/Ch.  Past Medical History:  Diagnosis Date  . Kidney disease, chronic, stage III (GFR 30-59 ml/min) (HCC)   . Pacemaker   . Renal failure, chronic, stage 3 (moderate) ([place]) 2015   Left kidney     Current Outpatient Medications:  .  alendronate (FOSAMAX) 70 MG tablet, Take 1 tablet (70 mg total) by mouth once a week., Disp: 12 tablet, Rfl: 3 .  ALPRAZolam (XANAX) 1 MG tablet, Take 1 tablet (1 mg total) by mouth at bedtime as needed., Disp: 90 tablet, Rfl: 1 .  amLODipine (NORVASC) 10 MG tablet, Take 1 tablet (10 mg total) by mouth daily., Disp: 90 tablet, Rfl: 3 .  atorvastatin (LIPITOR) 80 MG tablet, Take 1 tablet (80 mg total) by mouth daily., Disp: 90 tablet, Rfl: 3 .  budesonide (ENTOCORT EC) 3 MG 24 hr capsule, Take 3 mg by mouth as needed. (Patient not taking: Reported on 08/10/2020), Disp: , Rfl:  .  Calcium Carbonate-Vit D-Min (CALCIUM 1200 PO), Take 1 tablet by mouth daily., Disp: , Rfl:  .  CRANBERRY-CALCIUM PO, Take 1 tablet by mouth daily., Disp: , Rfl:  .  estradiol (ESTRACE) 0.5 MG tablet, Take 1 tablet (0.5 mg total) by mouth daily., Disp: 90 tablet, Rfl: 3 .  losartan (COZAAR) 25 MG tablet, Take 1 tablet (25 mg total) by mouth daily., Disp:  90 tablet, Rfl: 3 .  montelukast (SINGULAIR) 10 MG tablet, Take 10 mg by mouth at bedtime., Disp: , Rfl:  .  vitamin B-12 (CYANOCOBALAMIN) 1000 MCG tablet, Take 1 tablet (1,000 mcg total) by mouth daily., Disp: 90 tablet, Rfl: 3 .  zolpidem (AMBIEN) 5 MG tablet, Take 0.5 tablets (2.5 mg total) by mouth at bedtime., Disp: 45 tablet, Rfl: 1  Social History   Tobacco Use  Smoking Status Never Smoker  Smokeless Tobacco Never Used    Allergies  Allergen Reactions  . Tape Hives    Tape adhesive long period of time    Objective:  There were no vitals filed for this visit. There is no height or weight on file to calculate BMI. Constitutional Well developed. Well nourished.  Vascular Dorsalis pedis pulses palpable bilaterally. Posterior tibial pulses palpable bilaterally. Capillary refill normal to all digits.  No cyanosis or clubbing noted. Pedal hair growth normal.  Neurologic Normal speech. Oriented to person, place, and time. Epicritic sensation to light touch grossly present bilaterally.  Dermatologic Nails well groomed and normal in appearance. No open wounds. No skin lesions.  Orthopedic:  Bilateral second interspace neuroma versus capsulitis.  Positive Mulder's click.  Positive [place] sign left greater than right side.  Previous  scarring/surgical scarring noted.  Pain on palpation to the left first tarsometatarsal joint.  Mild osteoarthritic changes were clinically palpated.  No pain with resisted dorsiflexion and plantarflexion of the digit ruling out tendinitis   Radiographs: Bilateral second interspace: 3 views of skeletally mature adult bilateral foot: Previous hardware noted in the metatarsal second and third.  No sign of loosening or breaking noted.  There appear to be snap off screws.  Positive [place] sign noted on x-ray.  No elevatus noted.  Hardware appears intact Assessment:   1. Capsulitis of metatarsophalangeal (MTP) joint of left foot   2. Capsulitis of  metatarsophalangeal (MTP) joint of right foot    Plan:  Patient was evaluated and treated and all questions answered.  Left first tarsometatarsal joint capsulitis -I explained the patient the etiology of capsulitis versus treatment options were discussed.  I believe patient will benefit from a steroid shot to decrease acute inflammatory component associated pain.  She will still work on getting the operative note for me to assess exactly what the surgical was done versus neuroma excision as opposed to osteotomies in the bone as are apparent radiographically.  If no improvement we will discuss getting an MRI during next clinical visit.  Bilateral second interspace neuroma/capsulitis left greater than right -I explained to patient the etiology of neuroma and capsulitis and various treatment options were discussed.  Given that patient has previous surgery done to both of the feet with associated scarring in the interspace of second I believe there is likely a lot of scar tissue/vibratory tissue that could be causing her pain.  I believe patient may benefit from a steroid injection help decrease the pain associated with inflammation.  Patient agrees with the plan would like to proceed with injection. -A second steroid injection was performed at bilateral second MPJ using 1% plain Lidocaine and 10 mg of Kenalog. This was well tolerated. -Since the left side is worse than the right side I placed her in a surgical shoe to the left side. -Bilateral knee MRIs were ordered  No follow-ups on file.

## 2020-11-08 ENCOUNTER — Other Ambulatory Visit: Payer: Self-pay

## 2020-11-08 ENCOUNTER — Ambulatory Visit (INDEPENDENT_AMBULATORY_CARE_PROVIDER_SITE_OTHER): Payer: Medicare Other | Admitting: Family Medicine

## 2020-11-08 ENCOUNTER — Encounter: Payer: Self-pay | Admitting: Family Medicine

## 2020-11-08 ENCOUNTER — Other Ambulatory Visit (HOSPITAL_COMMUNITY)
Admission: RE | Admit: 2020-11-08 | Discharge: 2020-11-08 | Disposition: A | Discharge status: Home / Self Care | Payer: Medicare Other | Source: Ambulatory Visit | Attending: Family Medicine | Admitting: Family Medicine

## 2020-11-08 VITALS — BP 112/78 | HR 91 | Temp 98.0°F | Ht 63.0 in | Wt 134.0 lb

## 2020-11-08 DIAGNOSIS — N1831 Chronic kidney disease, stage 3a: Secondary | ICD-10-CM | POA: Diagnosis not present

## 2020-11-08 DIAGNOSIS — I10 Essential (primary) hypertension: Secondary | ICD-10-CM

## 2020-11-08 DIAGNOSIS — Z01419 Encounter for gynecological examination (general) (routine) without abnormal findings: Secondary | ICD-10-CM | POA: Insufficient documentation

## 2020-11-08 DIAGNOSIS — Z0000 Encounter for general adult medical examination without abnormal findings: Secondary | ICD-10-CM | POA: Diagnosis not present

## 2020-11-08 DIAGNOSIS — Z1151 Encounter for screening for human papillomavirus (HPV): Secondary | ICD-10-CM | POA: Insufficient documentation

## 2020-11-08 DIAGNOSIS — M546 Pain in thoracic spine: Secondary | ICD-10-CM

## 2020-11-08 DIAGNOSIS — M810 Age-related osteoporosis without current pathological fracture: Secondary | ICD-10-CM

## 2020-11-08 DIAGNOSIS — Z124 Encounter for screening for malignant neoplasm of cervix: Secondary | ICD-10-CM

## 2020-11-08 DIAGNOSIS — D649 Anemia, unspecified: Secondary | ICD-10-CM | POA: Diagnosis not present

## 2020-11-08 DIAGNOSIS — G8929 Other chronic pain: Secondary | ICD-10-CM

## 2020-11-08 LAB — CBC
HCT: 41.5 % (ref 36.0–46.0)
Hemoglobin: 14.1 g/dL (ref 12.0–15.0)
MCHC: 33.9 g/dL (ref 30.0–36.0)
MCV: 94.3 fl (ref 78.0–100.0)
Platelets: 237 10*3/uL (ref 150.0–400.0)
RBC: 4.4 Mil/uL (ref 3.87–5.11)
RDW: 12.9 % (ref 11.5–15.5)
WBC: 7.9 10*3/uL (ref 4.0–10.5)

## 2020-11-08 NOTE — Progress Notes (Signed)
Annual Exam   Chief Complaint:  Chief Complaint  Patient presents with  . referral request    History of Present Illness:  Ms. Kim Pennington is a 65 y.o. No obstetric history on file. who LMP was No LMP recorded. Patient has had a hysterectomy., presents today for her annual examination.     Nutrition She does get adequate calcium and Vitamin D in her diet. Diet: healthy Exercise: 2 hours a day - weight training and cardio  Safety The patient wears seatbelts: yes.     The patient feels safe at home and in their relationships: yes.   Menstrual:  Symptoms of menopause: estrogen which helps     Cervical Cancer Screening (21-65):   Last Pap:  Thinks 4 years ago Results were: no abnormalities /neg HPV DNA   Breast Cancer Screening (Age 64-74):  There is FH of breast cancer. There is no FH of ovarian cancer. BRCA screening was done and negative.  Last Mammogram: 2021 The patient does want a mammogram this year.    Colon Cancer Screening:  Age 33-75 yo - benefits outweigh the risk. Adults 6-85 yo who have never been screened benefit.  Benefits: 134000 people in 2016 will be diagnosed and 49,000 will die - early detection helps [place]ms: Complications 2/2 to colonoscopy High Risk (Colonoscopy): genetic disorder (Lynch syndrome or familial adenomatous polyposis), personal hx of IBD, previous adenomatous polyp, or previous colorectal cancer, FamHx start 10 years before the age at diagnosis, increased in males and black race  Options:  FIT - looks for hemoglobin (blood in the stool) - specific and fairly sensitive - must be done annually Cologuard - looks for DNA and blood - more sensitive - therefore can have more false positives, every 3 years Colonoscopy - every 10 years if normal - sedation, bowl prep, must have someone drive you  Shared decision making and the patient had decided to do colonoscopy - in [place].   Social History   Tobacco Use  Smoking Status Never Smoker   Smokeless Tobacco Never Used    Lung Cancer Screening (Ages 14-43): not applicable   Weight Wt Readings from Last 3 Encounters:  11/08/20 134 lb (60.8 kg)  08/10/20 129 lb 8 oz (58.7 kg)  06/29/20 129 lb (58.5 kg)   Patient has normal BMI  BMI Readings from Last 1 Encounters:  11/08/20 23.74 kg/m     Chronic disease screening Blood pressure monitoring:  BP Readings from Last 3 Encounters:  11/08/20 112/78  08/10/20 110/80  06/29/20 110/70    Lipid Monitoring: Indication for screening: age >38, obesity, diabetes, family hx, CV risk factors.  Lipid screening: Yes  No results found for: CHOL, HDL, LDLCALC, LDLDIRECT, TRIG, CHOLHDL   Diabetes Screening: age >61, overweight, family hx, PCOS, hx of gestational diabetes, at risk ethnicity Diabetes Screening screening: Yes  No results found for: HGBA1C   Past Medical History:  Diagnosis Date  . Kidney disease, chronic, stage III (GFR 30-59 ml/min) (HCC)   . Pacemaker   . Renal failure, chronic, stage 3 (moderate) ([place]) 2015   Left kidney     Past Surgical History:  Procedure Laterality Date  . ABDOMINAL HYSTERECTOMY    . BILATERAL CARPAL TUNNEL RELEASE    . BREAST SURGERY    . CESAREAN SECTION     x4  . FOOT SURGERY    . HERNIA REPAIR    . HIP SURGERY     reduction of lesser trochanter   . LAPAROSCOPIC BILATERAL  SALPINGO OOPHERECTOMY    . left thumb tendon    . NEPHRECTOMY    . NEPHROSTOMY    . PACEMAKER IMPLANT    . RHINOPLASTY    . ROTATOR CUFF REPAIR    . TONSILECTOMY/ADENOIDECTOMY WITH MYRINGOTOMY      Prior to Admission medications   Medication Sig Start Date End Date Taking? Authorizing Provider  alendronate (FOSAMAX) 70 MG tablet Take 1 tablet (70 mg total) by mouth once a week. 06/29/20  Yes [name], MD  ALPRAZolam Duanne Moron) 1 MG tablet Take 1 tablet (1 mg total) by mouth at bedtime as needed. 08/10/20  Yes [name], MD  amLODipine (NORVASC) 10 MG tablet Take 1 tablet (10 mg total)  by mouth daily. 06/29/20  Yes [name], MD  atorvastatin (LIPITOR) 80 MG tablet Take 1 tablet (80 mg total) by mouth daily. 06/29/20  Yes [name], MD  budesonide (ENTOCORT EC) 3 MG 24 hr capsule Take 3 mg by mouth as needed.   Yes [provider]  Calcium Carbonate-Vit D-Min (CALCIUM 1200 PO) Take 1 tablet by mouth daily.   Yes [provider]  CRANBERRY-CALCIUM PO Take 1 tablet by mouth daily.   Yes [provider]  estradiol (ESTRACE) 0.5 MG tablet Take 1 tablet (0.5 mg total) by mouth daily. 06/29/20  Yes [name], MD  losartan (COZAAR) 25 MG tablet Take 1 tablet (25 mg total) by mouth daily. 06/29/20  Yes [name], MD  montelukast (SINGULAIR) 10 MG tablet Take 10 mg by mouth at bedtime.   Yes [provider]  vitamin B-12 (CYANOCOBALAMIN) 1000 MCG tablet Take 1 tablet (1,000 mcg total) by mouth daily. 06/29/20  Yes [name], MD  zolpidem (AMBIEN) 5 MG tablet Take 0.5 tablets (2.5 mg total) by mouth at bedtime. 08/10/20  Yes [name], MD    Allergies  Allergen Reactions  . Tape Hives    Tape adhesive long period of time     Gynecologic History: No LMP recorded. Patient has had a hysterectomy.  Obstetric History: No obstetric history on file.  Social History   Socioeconomic History  . Marital status: Married    Spouse name: Legrand Como   . Number of children: 4  . Years of education: some college  . Highest education level: Not on file  Occupational History  . Not on file  Tobacco Use  . Smoking status: Never Smoker  . Smokeless tobacco: Never Used  Vaping Use  . Vaping Use: Never used  Substance and Sexual Activity  . Alcohol use: Never  . Drug use: Never  . Sexual activity: Not Currently    Birth control/protection: Post-menopausal, Surgical  Other Topics Concern  . Not on file  Social History Narrative   06/29/20   From: [place], lived in [place] some, all her children live in 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 Diabetes Mother   . Heart disease Father   . Hyperlipidemia Sister   . Hypertension Sister   . Hyperlipidemia Brother   . Hypertension Brother   . Hyperlipidemia Brother   . Hypertension Brother   . Hyperlipidemia Sister   . Hypertension Sister   . Diabetes Sister   . Breast cancer Sister 67       x2  . Hyperlipidemia Sister   . Hypertension Sister   . Hyperlipidemia Sister   . Hypertension Sister     Review of Systems  Constitutional: Negative for chills and fever.  HENT: Negative for congestion and sore throat.   Eyes: Negative for blurred vision and double vision.  Respiratory: Negative for shortness of breath.   Cardiovascular: Negative for chest pain.  Gastrointestinal: Negative for heartburn, nausea and vomiting.  Genitourinary: Negative.   Musculoskeletal: Positive for back pain. Negative for myalgias.  Skin: Negative for rash.  Neurological: Negative for dizziness and headaches.  Endo/Heme/Allergies: Does not bruise/bleed easily.  Psychiatric/Behavioral: Negative for depression. The patient is not nervous/anxious.      Physical Exam BP 112/78   Pulse 91   Temp 98 F (36.7  C) (Temporal)   Ht _0  (1.6 m)   Wt 134 lb (60.8 kg)   SpO2 98%   BMI 23.74 kg/m    BP Readings from Last 3 Encounters:  11/08/20 112/78  08/10/20 110/80  06/29/20 110/70      Physical Exam Exam conducted with a chaperone present.  Constitutional:      General: She is not in acute distress.    Appearance: She is well-developed. She is not diaphoretic.  HENT:     Head: Normocephalic and atraumatic.     Right Ear: External ear normal.     Left Ear: External ear normal.     Nose: Nose normal.  Eyes:     General: No scleral icterus.    Conjunctiva/sclera: Conjunctivae normal.  Cardiovascular:     Rate and Rhythm: Normal rate and regular rhythm.     Heart sounds: No murmur heard.   Pulmonary:     Effort: Pulmonary effort is normal. No respiratory distress.     Breath sounds: Normal breath sounds. No wheezing.  Abdominal:     General: Bowel sounds are normal. There is no distension.     Palpations: Abdomen is soft. There is no mass.     Tenderness: There is no abdominal tenderness. There is no guarding or rebound.  Genitourinary:    General: Normal vulva.     Comments: No cervix present. Vaginal cuff intact with no issue.  Musculoskeletal:        General: Normal range of motion.     Cervical back: Neck supple.     Comments: Strength grossly normal  Lymphadenopathy:     Cervical: No cervical adenopathy.  Skin:    General: Skin is warm and dry.     Capillary Refill: Capillary refill takes less than 2 seconds.  Neurological:     Mental Status: She is alert and oriented to person, place, and time.  Deep Tendon Reflexes: Reflexes normal.  Psychiatric:        Mood and Affect: Mood normal.        Behavior: Behavior normal.     Results:  PHQ-9:  [place] Office Visit from 06/29/2020 in [place] at [place]  PHQ-9 Total Score 4        Assessment: 65 y.o. No obstetric history on file. female here for routine annual physical  examination.  Plan: Problem List Items Addressed This Visit      Cardiovascular and Mediastinum   Hypertension   Relevant Orders   Comprehensive metabolic panel   Lipid panel     Musculoskeletal and Integument   Osteoporosis   Relevant Orders   DG BONE DENSITY (DXA)     Genitourinary   Chronic renal insufficiency, stage III (moderate) (HCC)   Relevant Orders   Comprehensive metabolic panel     Other   Anemia   Relevant Orders   CBC    Other Visit Diagnoses    Annual physical exam    -  Primary   Chronic bilateral thoracic back pain       Relevant Orders   Ambulatory referral to Neurosurgery   Screening for cervical cancer       Relevant Orders   Cytology - HPV w/PAP any interp. Reflex 16/18 genotyping if HPV + ([facility])      Screening: -- Blood pressure screen controlled -- cholesterol screening: will obtain -- Weight screening: normal -- Diabetes Screening: will obtain -- Nutrition: Encouraged healthy diet  The ASCVD Risk score Mikey Bussing DC Jr., et al., 2013) failed to calculate for the following reasons:   Cannot find a previous HDL lab   Cannot find a previous total cholesterol lab  -- Statin therapy for Age 51-75 with CVD risk >7.5%  Psych -- Depression screening (PHQ-9):  [place] Visit from 06/29/2020 in [place] at [facility]  PHQ-9 Total Score 4       Safety -- tobacco screening: not using -- alcohol screening:  low-risk usage. -- no evidence of domestic violence or intimate partner violence.   Cancer Screening -- pap smear collected per ASCCP guidelines - though will no longer need screening -- family history of breast cancer screening: done. not at high risk. -- Mammogram - number provided -- Colon cancer (age 39+)-- requested  Immunizations Immunization History  Administered Date(s) Administered  . PFIZER(Purple Top)SARS-COV-2 Vaccination 08/13/2019, 09/03/2019, 06/14/2020    Requesting records for many  vaccines -- Covid-19 Vaccine up to date   Encouraged healthy diet and exercise. Encouraged regular vision and dental care.    [name], MD

## 2020-11-08 NOTE — Patient Instructions (Addendum)
Will try to get records for vaccines    Please call the location of your choice from the menu below to schedule your Mammogram and/or Bone Density appointment.     [place]  [address] [place] at [facility]   Phone:  [phone]   [place], [place] [address]                                            Services: 3D Mammogram and Bone Density

## 2020-11-09 ENCOUNTER — Other Ambulatory Visit: Payer: Self-pay | Admitting: Family Medicine

## 2020-11-09 DIAGNOSIS — N1831 Chronic kidney disease, stage 3a: Secondary | ICD-10-CM

## 2020-11-09 LAB — COMPREHENSIVE METABOLIC PANEL
ALT: 36 U/L — ABNORMAL HIGH (ref 0–35)
AST: 29 U/L (ref 0–37)
Albumin: 4.6 g/dL (ref 3.5–5.2)
Alkaline Phosphatase: 50 U/L (ref 39–117)
BUN: 30 mg/dL — ABNORMAL HIGH (ref 6–23)
CO2: 25 mEq/L (ref 19–32)
Calcium: 10.2 mg/dL (ref 8.4–10.5)
Chloride: 103 mEq/L (ref 96–112)
Creatinine, Ser: 1.15 mg/dL (ref 0.40–1.20)
GFR: 50.56 mL/min — ABNORMAL LOW (ref >60.00)
Glucose, Bld: 90 mg/dL (ref 70–99)
Potassium: 5 mEq/L (ref 3.5–5.1)
Sodium: 137 mEq/L (ref 135–145)
Total Bilirubin: 0.4 mg/dL (ref 0.2–1.2)
Total Protein: 7.5 g/dL (ref 6.0–8.3)

## 2020-11-09 LAB — LIPID PANEL
Cholesterol: 161 mg/dL (ref 0–200)
HDL: 88.5 mg/dL (ref >39.00)
LDL Cholesterol: 61 mg/dL (ref 0–99)
NonHDL: 72.29
Total CHOL/HDL Ratio: 2
Triglycerides: 55 mg/dL (ref 0.0–149.0)
VLDL: 11 mg/dL (ref 0.0–40.0)

## 2020-11-10 ENCOUNTER — Telehealth: Payer: Self-pay | Admitting: *Deleted

## 2020-11-10 ENCOUNTER — Other Ambulatory Visit: Payer: Self-pay | Admitting: Family Medicine

## 2020-11-10 DIAGNOSIS — Z1231 Encounter for screening mammogram for malignant neoplasm of breast: Secondary | ICD-10-CM

## 2020-11-10 LAB — CYTOLOGY - PAP
Comment: NEGATIVE
Diagnosis: NEGATIVE
High risk HPV: NEGATIVE

## 2020-11-10 NOTE — Telephone Encounter (Signed)
Left message for patient letting her know that referral was placed on 11/08/20 and was sent over to [facility] clinic on 11/09/20 and office is reviewing it and will call her directly to schedule, provided patient with phone number to their office to follow up if needed.

## 2020-11-10 NOTE — Telephone Encounter (Signed)
Patient left a voicemail stating that she was in Monday to see Dr. Einar Pheasant. Patient stated that she was being referred to a neurosurgeon and has not heard anything back from the office. Patient stated that the numbness has gotten worse and requested a call back about the referral.

## 2020-11-11 ENCOUNTER — Telehealth: Payer: Self-pay | Admitting: *Deleted

## 2020-11-11 NOTE — Telephone Encounter (Signed)
Kim Pennington called triage line asking that her referral for the neurosurgeon be expedited.  She states her left arm in now numb and her pain is excruciating.  She prefers to see a neurosurgeon that works with athletes.  Prefers [employer].

## 2020-11-11 NOTE — Telephone Encounter (Signed)
Noted. Has anyone notified patient of these updates?  We can always evaluate her in our office if she can't get in, seems like this is a chronic issue?

## 2020-11-11 NOTE — Telephone Encounter (Signed)
Referral was sent 11/09/20 to Dr Aris Lot in [place] clinic  Referral Note General 11/09/2020  4:11 PM Virl Cagey, CMA - -  Referral and OV notes faxed via [facility]. Faxed to [phone] [email or web address]  Virl Cagey, CMA on 11/09/2020 1611 - delivered at 11/09/2020 1611 -------------  They cannot locate the referral  This has been re-faxed to their office and they are going to call our office once received to try to coordinate an appt for the patient. I explained that this is now urgent due increased pain and that we need to try and get her scheduled ASAP Faxed to [phone]    [email or web address]  Virl Cagey, CMA on 11/11/2020 1457 - delivered at 11/11/2020 1457

## 2020-11-11 NOTE — Telephone Encounter (Signed)
Mr. Verne left voicemail on triage stating his wife had called in earlier today but has not heard back from anyone.  He states he wife is in a great deal of pain in her back and wants to know what Dr. Einar Pheasant recommends.

## 2020-11-11 NOTE — Telephone Encounter (Signed)
Advised pt of provider msg and urgency of msg and pt refused to go to the ER. She said this has happened in the past to her and her husband and it was a waste of time and money. She said they cannot do anything except give her pain meds and mask the problem but cannot help fix the problem. Pt expressed appreciation for the f/u but refused recommendations at this time. Pt said she will wait for the neurosurgeon to call. Advised pt if symptoms worsen or she developed any new symptoms t go to ER. Gave pt ER precautions. Pt verbalized understanding.

## 2020-11-11 NOTE — Telephone Encounter (Signed)
See 6/9 TE  They cannot locate the referral - referral refaxed  This has been re-faxed to their office and they are going to call our office once received to try to coordinate an appt for the patient. I explained that this is now urgent due increased pain and that we need to try and get her scheduled ASAP  Faxed to [phone] [email or web address] Virl Cagey, CMA on 11/11/2020 1457 - delivered at 11/11/2020 1457

## 2020-11-11 NOTE — Telephone Encounter (Signed)
If she's experiencing loss of bowel or bladder control coupled with lower back pain with numbness then she needs to be evaluated ASAP. I recommend ED as she could experiencing permanent spinal cord damage/paralysis.

## 2020-11-11 NOTE — Telephone Encounter (Signed)
[facility] called back and states they received the fax this time. It is also in proficient for their office. This has to be reviewed and then they would call the patient. I am not sure how soon they can get patient in, original referral was put in as routine but I told their office patient is in more pain.  Sending to provider to see if they  can help patient in the meantime with medication to get her through.

## 2020-11-11 NOTE — Telephone Encounter (Signed)
Pt c/o bilateral back pain which she states "I am not sure if it is herniated or in the process of herniating." Advised pt that the referral has been pushed to a higher level of urgency but cannot know when the office would contact her. C/o arm numbness and tingling for 2 wks. Pt reported nothing helps the pain and she does not take anything for the pain because she CKD and has to watch what she takes. Advised pt she could take tylenol. Pt said she does not like to take any pain meds. Inquired if pain meds were sent in would she would use them. She said no but her husband said she has used muscle relaxers in the past and they have helped a little. Pt reported that it takes a lot to help her because she is very resistant to meds and even when anethesia is used for surgery they have to use more than normal.  Advised pt the referral has been upgraded and if she needed anything to contact office. Advised if her symptoms worsened to go to ER or UC. Pt verbalized understanding.  Pt called back shortly after and requested that she probably should get some pain medication and that it probably will help. She also reported that starting today she is unable to hold her urine. Inquired if this has happened in the past and she said no and that she was just dribbling. Advised she may need to be assessed due to her not being able to hold her urine. Advised a msg would be sent to the provider that is taking care of Dr Cody's msgs and this office would f/u. Advised she may need to go to UC to be assessed. Gave pt ER precautions. Pt verbalized understanding.

## 2020-11-12 ENCOUNTER — Encounter: Payer: Self-pay | Admitting: Family Medicine

## 2020-11-12 NOTE — Telephone Encounter (Signed)
Noted  

## 2020-11-12 NOTE — Telephone Encounter (Signed)
Please call patient to check in on her.   I agree with Kim Pennington recommendation and if neurologic symptoms are persisting would recommend UC or ER evaluation

## 2020-11-12 NOTE — Telephone Encounter (Signed)
Pt has already been advised if symptoms worsen to go to ER or UC during last call. Pt did verbalize understanding at that time.

## 2020-11-17 ENCOUNTER — Other Ambulatory Visit: Payer: Self-pay

## 2020-11-17 ENCOUNTER — Ambulatory Visit
Admission: RE | Admit: 2020-11-17 | Discharge: 2020-11-17 | Disposition: A | Discharge status: Home / Self Care | Payer: Medicare Other | Source: Ambulatory Visit | Attending: Family Medicine | Admitting: Family Medicine

## 2020-11-17 DIAGNOSIS — M810 Age-related osteoporosis without current pathological fracture: Secondary | ICD-10-CM | POA: Diagnosis not present

## 2020-11-17 DIAGNOSIS — Z1231 Encounter for screening mammogram for malignant neoplasm of breast: Secondary | ICD-10-CM | POA: Diagnosis not present

## 2020-11-17 DIAGNOSIS — Z780 Asymptomatic menopausal state: Secondary | ICD-10-CM | POA: Diagnosis not present

## 2020-11-17 DIAGNOSIS — M85851 Other specified disorders of bone density and structure, right thigh: Secondary | ICD-10-CM | POA: Diagnosis not present

## 2020-11-18 ENCOUNTER — Encounter: Payer: Self-pay | Admitting: Family Medicine

## 2020-11-19 NOTE — Telephone Encounter (Signed)
Pt called back with a fax number to request past mammogram and dexa scan results; [phone]- ATTN: File Room at [facility].  I could not find a signed release form in pts chart so she is going to come to office and sign one in order for me to request these records.

## 2020-11-24 ENCOUNTER — Telehealth: Payer: Self-pay | Admitting: *Deleted

## 2020-11-24 NOTE — Telephone Encounter (Signed)
"  I'm a patient of Dr. Posey Pronto.  Dr. Posey Pronto was going to refer me for a MRI bilaterally on my feet. I need to follow up on that and see what we are doing."

## 2020-11-25 ENCOUNTER — Other Ambulatory Visit: Payer: Self-pay | Admitting: *Deleted

## 2020-11-25 ENCOUNTER — Inpatient Hospital Stay
Admission: RE | Admit: 2020-11-25 | Discharge: 2020-11-25 | Disposition: A | Discharge status: Home / Self Care | Payer: Self-pay | Source: Ambulatory Visit | Attending: *Deleted | Admitting: *Deleted

## 2020-11-25 ENCOUNTER — Other Ambulatory Visit: Payer: Self-pay | Admitting: Family Medicine

## 2020-11-25 DIAGNOSIS — Z1231 Encounter for screening mammogram for malignant neoplasm of breast: Secondary | ICD-10-CM

## 2020-11-25 DIAGNOSIS — R928 Other abnormal and inconclusive findings on diagnostic imaging of breast: Secondary | ICD-10-CM

## 2020-11-25 DIAGNOSIS — N6489 Other specified disorders of breast: Secondary | ICD-10-CM

## 2020-11-29 ENCOUNTER — Encounter: Payer: Self-pay | Admitting: Family Medicine

## 2020-11-30 ENCOUNTER — Telehealth: Payer: Self-pay

## 2020-11-30 NOTE — Telephone Encounter (Signed)
Pt called in and states that she wants more clarification on her dexa scan and mammogram results. She is aware that Dr. Einar Pheasant is out of the office until 12/07/20.

## 2020-12-02 ENCOUNTER — Ambulatory Visit
Admission: RE | Admit: 2020-12-02 | Discharge: 2020-12-02 | Disposition: A | Discharge status: Home / Self Care | Payer: Medicare Other | Source: Ambulatory Visit | Attending: Family Medicine | Admitting: Family Medicine

## 2020-12-02 ENCOUNTER — Other Ambulatory Visit: Payer: Self-pay

## 2020-12-02 DIAGNOSIS — N6489 Other specified disorders of breast: Secondary | ICD-10-CM | POA: Insufficient documentation

## 2020-12-02 DIAGNOSIS — R928 Other abnormal and inconclusive findings on diagnostic imaging of breast: Secondary | ICD-10-CM | POA: Diagnosis not present

## 2020-12-02 DIAGNOSIS — R922 Inconclusive mammogram: Secondary | ICD-10-CM | POA: Diagnosis not present

## 2020-12-07 NOTE — Telephone Encounter (Signed)
Followed up with patient.   Reviewed 10 year risk for breast cancer is 12.3% - higher than average but less than 20% threshold for MRI. Will repeat next year to ensure correct score.   Will need to get previous records which we have not received yet for the Dexa - she has also reached out. Sending to MA to be on the lookout for pt's prior Dexa scans.

## 2020-12-17 ENCOUNTER — Other Ambulatory Visit: Payer: Self-pay | Admitting: Neurosurgery

## 2020-12-17 ENCOUNTER — Other Ambulatory Visit (HOSPITAL_COMMUNITY): Payer: Self-pay | Admitting: Neurosurgery

## 2020-12-17 DIAGNOSIS — Z9889 Other specified postprocedural states: Secondary | ICD-10-CM

## 2020-12-17 DIAGNOSIS — M5136 Other intervertebral disc degeneration, lumbar region: Secondary | ICD-10-CM

## 2020-12-27 ENCOUNTER — Telehealth: Payer: Self-pay | Admitting: *Deleted

## 2020-12-27 ENCOUNTER — Telehealth: Payer: Self-pay

## 2020-12-27 NOTE — Telephone Encounter (Signed)
Called [facility] and they do not do MRI w/ pacemakers but they said that the hospital can [facility]).

## 2020-12-27 NOTE — Telephone Encounter (Signed)
Patient called and left a message. She said that [facility] called her and let her know that they cannot do her MRI at that facility because of her pacemaker. She wants to know if there is another facility close by that can. Please call to advise Thanks

## 2020-12-28 ENCOUNTER — Other Ambulatory Visit: Payer: Self-pay | Admitting: Podiatry

## 2020-12-28 DIAGNOSIS — M7751 Other enthesopathy of right foot: Secondary | ICD-10-CM

## 2020-12-28 DIAGNOSIS — M7752 Other enthesopathy of left foot: Secondary | ICD-10-CM

## 2020-12-29 DIAGNOSIS — Z20822 Contact with and (suspected) exposure to covid-19: Secondary | ICD-10-CM | POA: Diagnosis not present

## 2021-01-10 ENCOUNTER — Telehealth: Payer: Self-pay | Admitting: *Deleted

## 2021-01-10 NOTE — Telephone Encounter (Signed)
They did not receive, refaxed 01/10/21

## 2021-01-10 NOTE — Telephone Encounter (Signed)
Refaxed referral to [facility] for MRI, B/L feet.01/10/21

## 2021-01-13 DIAGNOSIS — G8929 Other chronic pain: Secondary | ICD-10-CM | POA: Diagnosis not present

## 2021-01-13 DIAGNOSIS — M6281 Muscle weakness (generalized): Secondary | ICD-10-CM | POA: Diagnosis not present

## 2021-01-13 DIAGNOSIS — M545 Low back pain, unspecified: Secondary | ICD-10-CM | POA: Diagnosis not present

## 2021-01-18 ENCOUNTER — Telehealth (INDEPENDENT_AMBULATORY_CARE_PROVIDER_SITE_OTHER): Payer: Medicare Other | Admitting: Family Medicine

## 2021-01-18 ENCOUNTER — Encounter: Payer: Self-pay | Admitting: Family Medicine

## 2021-01-18 DIAGNOSIS — F330 Major depressive disorder, recurrent, mild: Secondary | ICD-10-CM | POA: Diagnosis not present

## 2021-01-18 DIAGNOSIS — H04123 Dry eye syndrome of bilateral lacrimal glands: Secondary | ICD-10-CM | POA: Diagnosis not present

## 2021-01-18 DIAGNOSIS — J0140 Acute pansinusitis, unspecified: Secondary | ICD-10-CM

## 2021-01-18 MED ORDER — AMOXICILLIN-POT CLAVULANATE 875-125 MG PO TABS: 1.0000 | ORAL_TABLET | Freq: Two times a day (BID) | ORAL | 0 refills | Status: AC

## 2021-01-18 NOTE — Progress Notes (Signed)
I connected with Kim Pennington on 01/18/21 at  2:40 PM EDT by video and verified that I am speaking with the correct person using two identifiers.   I discussed the limitations, risks, security and privacy concerns of performing an evaluation and management service by video and the availability of in person appointments. I also discussed with the patient that there may be a patient responsible charge related to this service. The patient expressed understanding and agreed to proceed.  Patient location: Home Provider Location: [facility] Participants: [name] and Kim Pennington   Subjective:     Kim Pennington is a 65 y.o. female presenting for Sinusitis (X 1 month), Cough (X 1 month), Sore Throat, and Nasal Congestion     Sinusitis This is a new problem. The current episode started more than 1 month ago. The problem has been waxing and waning since onset. There has been no fever. Associated symptoms include congestion, coughing, ear pain, headaches, sinus pressure and a sore throat. Pertinent negatives include no chills, shortness of breath or swollen glands. Past treatments include nothing.  Cough Associated symptoms include ear pain, headaches and a sore throat. Pertinent negatives include no chills or shortness of breath.  Sore Throat  Associated symptoms include congestion, coughing, ear pain and headaches. Pertinent negatives include no shortness of breath or swollen glands.    Onset with fever - robutussin/dayquil nyquil x 1 week but didn't want to continues, neti pot  Review of Systems  Constitutional:  Negative for chills.  HENT:  Positive for congestion, ear pain, sinus pressure and sore throat.   Respiratory:  Positive for cough. Negative for shortness of breath.   Neurological:  Positive for headaches.    Social History   Tobacco Use  Smoking Status Never  Smokeless Tobacco Never        Objective:   BP Readings from Last 3 Encounters:   11/08/20 112/78  08/10/20 110/80  06/29/20 110/70   Wt Readings from Last 3 Encounters:  11/08/20 134 lb (60.8 kg)  08/10/20 129 lb 8 oz (58.7 kg)  06/29/20 129 lb (58.5 kg)   There were no vitals taken for this visit.  Physical Exam Constitutional:      Appearance: Normal appearance. She is not ill-appearing.  HENT:     Head: Normocephalic and atraumatic.     Right Ear: External ear normal.     Left Ear: External ear normal.  Eyes:     Conjunctiva/sclera: Conjunctivae normal.  Pulmonary:     Effort: Pulmonary effort is normal. No respiratory distress.  Neurological:     Mental Status: She is alert. Mental status is at baseline.  Psychiatric:        Mood and Affect: Mood normal.        Behavior: Behavior normal.        Thought Content: Thought content normal.        Judgment: Judgment normal.            Assessment & Plan:   Problem List Items Addressed This Visit       Other   Recurrent major depressive disorder ([place])    Worsening symptoms. Referral to therapy      Relevant Orders   Ambulatory referral to Psychology   Other Visit Diagnoses     Acute non-recurrent pansinusitis    -  Primary   Relevant Medications   amoxicillin-clavulanate (AUGMENTIN) 875-125 MG tablet      Abx prescribed Trial of allergy  treatment for 2-3 days first   No follow-ups on file.  [name], MD

## 2021-01-18 NOTE — Assessment & Plan Note (Signed)
Worsening symptoms. Referral to therapy

## 2021-01-18 NOTE — Patient Instructions (Signed)
[place] location:   [facility] [phone]-  -Counseling/therapy (14 years and up- [employer] only)   -Psychiatry services most insurances accepted-treat and evaluate/diagnose patients and prescribe medications.  -Medication Management 64 years old to 87 years.  -Diagnoses treated: Depression/anxiety, ADHD, Substance Abuse, Bipolar Disorder, Psychotherapy, Pain Management, Spiritual Care Services.  [facility] (80 years of age and older) [phone]- Psychology services/therapy only.  [facility] [phone]- All ages-Just Therapy services   [facility] [phone]- [facility]-counseling only.   [facility] [phone]- All ages-Psychology and Psychiatrist services (evaluate, treat, diagnose, prescribe medication) Treat all the diagnoses that would fall under Behavioral issues.   For new patients, on Monday, Wednesday and Friday from 8 am to 3 pm patient would just walk in and fill out paperwork and they would get patient enrolled in the services they need based on their answers.   [facility] [phone]- All ages. Monday through Friday-9am to 4 pm walk in times for patients to come in and be evaluated. Medication Management only at this time. No therapy available.   [place] location:   [facility] Address: [address], [place] [address] Phone: [phone] Counseling and psychiatry services.   [facility] and [place] locations)- [phone]- all ages, only therapy/counseling services/psychological evaluations. Services: aptitude testing, academic achievement testing, learning Disability evaluation, ADHD evaluations, psycho-educational evaluations, readiness for kindergarten Evaluations.   [place] location only has therapy services right now  [facility]  [phone] services only. Works off [place] in [place].

## 2021-01-31 ENCOUNTER — Telehealth: Payer: Self-pay | Admitting: *Deleted

## 2021-01-31 NOTE — Telephone Encounter (Signed)
Patient scheduled for an appointment tomorrow 02/01/21 at 3:00 pm with Dr. Einar Pheasant.

## 2021-01-31 NOTE — Telephone Encounter (Signed)
Noted. With new symptoms would recommend covid testing at home or switch to virtual visit and can do covid testing at the office

## 2021-01-31 NOTE — Telephone Encounter (Signed)
Spoke to patient by telephone and advised her that it was recommended that she go to the ER to be evaluated. Patient stated that she has to go with her husband for a doctor's appointment this afternoon and can not miss that appointment.  Patient stated that she knows her body and feels that her symptoms are from the ongoing sinus issues that she has. Patient stated if her symptoms get worse she will go to the ER. Patient stated that she plans on keeping the appointment with Dr. Einar Pheasant tomorrow. Patient was again advised that ER is recommenced today.

## 2021-01-31 NOTE — Telephone Encounter (Signed)
Patient called the office back and stated that she did a home covid test and it was negative.

## 2021-01-31 NOTE — Telephone Encounter (Signed)
PLEASE NOTE: All timestamps contained within this report are represented as [place] Standard Time. CONFIDENTIALTY NOTICE: This fax transmission is intended only for the addressee. It contains information that is legally privileged, confidential or otherwise protected from use or disclosure. If you are not the intended recipient, you are strictly prohibited from reviewing, disclosing, copying using or disseminating any of this information or taking any action in reliance on or regarding this information. If you have received this fax in error, please notify us immediately by telephone so that we can arrange for its return to [place]. Phone: [phone], Toll-Free: [phone], Fax: [phone] Page: 1 of 2 Call Id: [id number] [facility] - Client TELEPHONE ADVICE RECORD [facility] Patient Name: Kim Pennington [facility] Gender: Female DOB: 12-14-1955 Age: 65 Y 1 M 19 D Return Phone Number: [phone] (Primary) Address: City/ State/ Zip: [place] [address] Client [facility] - Client Client Site [place] - Day Physician Waunita Schooner- MD Contact Type Call Who Is Calling Patient / Member / Family / Caregiver Call Type Triage / Clinical Relationship To Patient Self Return Phone Number [phone] (Primary) Chief Complaint CHEST PAIN - pain, pressure, heaviness or tightness Reason for Call Symptomatic / Request for Health Information Initial Comment Transferred from office, some appts are open. PT is having chest pains that started yesterday with severe headache, coughing and earache. Translation No Nurse Assessment Nurse: Della Goo, RN, Vicente Males Date/Time (Eastern Time): 01/31/2021 10:07:16 AM Confirm and document reason for call. If symptomatic, describe symptoms. ---Caller states she has chest pain, coughing, ear ache, HA, dizzy. cp currently 3-4/10. Felt feverish last night but didn't check temp. She has had  these symptoms three times now within the last couple months, was placed on abx last time which didn't help. Does the patient have any new or worsening symptoms? ---Yes Will a triage be completed? ---Yes Related visit to physician within the last 2 weeks? ---No Does the PT have any chronic conditions? (i.e. diabetes, asthma, this includes High risk factors for pregnancy, etc.) ---No Is this a behavioral health or substance abuse call? ---No Guidelines Guideline Title Affirmed Question Affirmed Notes Nurse Date/Time ([place] Time) COVID-19 - Diagnosed or Suspected MODERATE difficulty breathing (e.g., speaks in phrases, SOB even at rest, pulse 100-120) Quandt, RN, Vicente Males 01/31/2021 10:09:08 AM Disp. Time Eilene Ghazi Time) Disposition Final User 01/31/2021 10:06:33 AM Send to Urgent Diego Cory NOTE: All timestamps contained within this report are represented as [place] Standard Time. CONFIDENTIALTY NOTICE: This fax transmission is intended only for the addressee. It contains information that is legally privileged, confidential or otherwise protected from use or disclosure. If you are not the intended recipient, you are strictly prohibited from reviewing, disclosing, copying using or disseminating any of this information or taking any action in reliance on or regarding this information. If you have received this fax in error, please notify us immediately by telephone so that we can arrange for its return to [place]. Phone: [phone], Toll-Free: [phone], Fax: [phone] Page: 2 of 2 Call Id: [id number] 01/31/2021 10:11:53 AM Go to ED Now Yes Della Goo, RN, Allegra Lai Disagree/Comply Disagree Caller Understands Yes PreDisposition Call Doctor Care Advice Given Per Guideline GO TO ED NOW: * Leave now. Drive carefully. CARE ADVICE given per COVID-19 - DIAGNOSED OR SUSPECTED (Adult) guideline. * Confusion occurs. * Lips or face turns blue * Severe difficulty breathing occurs  CALL EMS 911 IF: Comments User: Juanda Crumble, RN Date/Time Eilene Ghazi Time): 01/31/2021  10:09:00 AM stage III renal failure, pacemaker User: Juanda Crumble, RN Date/Time Eilene Ghazi Time): 01/31/2021 10:18:40 AM refused ed, contacted backline, no appts available today. notified caller and urged her to go to ER, still declines. connected with office number to schedule appt later in the week per caller's request Referrals GO TO FACILITY REFUSED

## 2021-01-31 NOTE — Telephone Encounter (Signed)
Please call pt.   It looks like they recommended ER

## 2021-01-31 NOTE — Telephone Encounter (Signed)
Patient notified as instructed by telephone and verbalized understanding. Patient stated that she will do a home covid test and call back with the results. Patient denies a fever. Patient stated that she has chest pain when she coughs. Patient stated that she does have a headache. Patient stated that she has been off and on for a month.

## 2021-02-01 ENCOUNTER — Ambulatory Visit (INDEPENDENT_AMBULATORY_CARE_PROVIDER_SITE_OTHER): Payer: Medicare Other | Admitting: Family Medicine

## 2021-02-01 ENCOUNTER — Encounter: Payer: Self-pay | Admitting: Family Medicine

## 2021-02-01 ENCOUNTER — Other Ambulatory Visit: Payer: Self-pay

## 2021-02-01 VITALS — BP 100/80 | HR 80 | Temp 98.0°F | Wt 136.2 lb

## 2021-02-01 DIAGNOSIS — Z9889 Other specified postprocedural states: Secondary | ICD-10-CM | POA: Diagnosis not present

## 2021-02-01 DIAGNOSIS — Z23 Encounter for immunization: Secondary | ICD-10-CM | POA: Diagnosis not present

## 2021-02-01 DIAGNOSIS — L821 Other seborrheic keratosis: Secondary | ICD-10-CM

## 2021-02-01 DIAGNOSIS — R079 Chest pain, unspecified: Secondary | ICD-10-CM | POA: Diagnosis not present

## 2021-02-01 DIAGNOSIS — J329 Chronic sinusitis, unspecified: Secondary | ICD-10-CM | POA: Insufficient documentation

## 2021-02-01 MED ORDER — HYDROCOD POLST-CPM POLST ER 10-8 MG/5ML PO SUER: 5.0000 mL | Freq: Two times a day (BID) | ORAL | 0 refills | Status: DC | PRN

## 2021-02-01 MED ORDER — PREDNISONE 20 MG PO TABS: 40.0000 mg | ORAL_TABLET | Freq: Every day | ORAL | 0 refills | Status: AC

## 2021-02-01 NOTE — Progress Notes (Signed)
Subjective:     Kim Pennington is a 65 y.o. female presenting for Sinusitis (Headache, nasal drainage, cough from drainage) and Chest Pain     HPI  #Sinusitis - took antibiotics on 01/18/2021 - no improvement with symptoms -  headaches, cough, sinus drainage - was doing neti pot w/o improvement - using nasal spray and allergy pill w/o improvement - was doing robitussin w/o improvement   #chest pain - worse with cough and lingers - sternal, deep - does not radiating  - constant - worse with cough - not worse with activity - nighttime is not breathing great - but 2/2 to congestion   Review of Systems   Social History   Tobacco Use  Smoking Status Never  Smokeless Tobacco Never        Objective:    BP Readings from Last 3 Encounters:  02/01/21 100/80  11/08/20 112/78  08/10/20 110/80   Wt Readings from Last 3 Encounters:  02/01/21 136 lb 4 oz (61.8 kg)  11/08/20 134 lb (60.8 kg)  08/10/20 129 lb 8 oz (58.7 kg)    BP 100/80   Pulse 80   Temp 98 F (36.7 C) (Temporal)   Wt 136 lb 4 oz (61.8 kg)   SpO2 98%   BMI 24.14 kg/m    Physical Exam Constitutional:      General: She is not in acute distress.    Appearance: She is well-developed. She is not diaphoretic.  HENT:     Head: Normocephalic and atraumatic.     Right Ear: Tympanic membrane, ear canal and external ear normal.     Left Ear: Tympanic membrane, ear canal and external ear normal.     Nose:     Right Sinus: Maxillary sinus tenderness present. No frontal sinus tenderness.     Left Sinus: Maxillary sinus tenderness present. No frontal sinus tenderness.     Mouth/Throat:     Pharynx: Uvula midline. Posterior oropharyngeal erythema present. No oropharyngeal exudate.     Tonsils: 0 on the right. 0 on the left.  Eyes:     General: No scleral icterus.    Conjunctiva/sclera: Conjunctivae normal.  Cardiovascular:     Rate and Rhythm: Normal rate and regular rhythm.     Heart sounds: Murmur  heard.  Pulmonary:     Effort: Pulmonary effort is normal. No respiratory distress.     Breath sounds: Normal breath sounds.  Chest:     Chest wall: No tenderness or crepitus.  Musculoskeletal:     Cervical back: Neck supple.  Lymphadenopathy:     Cervical: No cervical adenopathy.  Skin:    General: Skin is warm and dry.     Capillary Refill: Capillary refill takes less than 2 seconds.     Comments: Waxy erythematous plaque on the right chest  Neurological:     Mental Status: She is alert. Mental status is at baseline.  Psychiatric:        Mood and Affect: Mood normal.        Behavior: Behavior normal.   EKG: sinus rhythm, low voltage, q-waves present but stable from prior. No ST changes, no t wave anormalities        Assessment & Plan:   Problem List Items Addressed This Visit       Respiratory   Recurrent sinusitis    No improvement following abx or allergy treatment. Advised another course of abx, but pt declined. Will try steroids. ENT referral given duration and hx  of surgery with relief in the past.       Relevant Medications   predniSONE (DELTASONE) 20 MG tablet   chlorpheniramine-HYDROcodone (TUSSIONEX) 10-8 MG/5ML SUER   Other Relevant Orders   Ambulatory referral to ENT     Musculoskeletal and Integument   Seborrheic keratoses    Reassurance. Advised photograph and watch and wait. If additional increase in size see dermatology        Other   Chest pain - Primary    EKG stable. BP normal. Discussed this may be due to coughing. No signs of costochondritis on exam. Advised watch and wait and notify if worsening.       Relevant Orders   EKG 12-Lead (Completed)   Other Visit Diagnoses     Need for influenza vaccination       Relevant Orders   Flu Vaccine QUAD 5moIM (Fluarix, Fluzone & Alfiuria Quad PF) (Completed)   History of rhinoplasty       Relevant Orders   Ambulatory referral to ENT        Return if symptoms worsen or fail to  improve.  JLesleigh Noe MD  This visit occurred during the SARS-CoV-2 public health emergency.  Safety protocols were in place, including screening questions prior to the visit, additional usage of staff PPE, and extensive cleaning of exam room while observing appropriate contact time as indicated for disinfecting solutions.

## 2021-02-01 NOTE — Assessment & Plan Note (Signed)
EKG stable. BP normal. Discussed this may be due to coughing. No signs of costochondritis on exam. Advised watch and wait and notify if worsening.

## 2021-02-01 NOTE — Patient Instructions (Addendum)
#  Chest pain - likely due to cough - EKG stable  #Sinus - referral to ENT - steroids to see if that helps  #Skin lesion - watch and wait

## 2021-02-01 NOTE — Assessment & Plan Note (Signed)
No improvement following abx or allergy treatment. Advised another course of abx, but pt declined. Will try steroids. ENT referral given duration and hx of surgery with relief in the past.

## 2021-02-01 NOTE — Assessment & Plan Note (Signed)
Reassurance. Advised photograph and watch and wait. If additional increase in size see dermatology

## 2021-02-08 ENCOUNTER — Telehealth: Payer: Self-pay | Admitting: *Deleted

## 2021-02-08 NOTE — Telephone Encounter (Signed)
Called and informed patient that they([facility]) will call her if they are able to do the MRI and if so will schedule at [facility], verbalized understanding.

## 2021-02-08 NOTE — Telephone Encounter (Signed)
-----   Message from Felipa Furnace, DPM sent at 12/16/2020 12:51 PM EDT ----- Regarding: MRI scheduling Hi Micheal Murad ,  So this patient's MRI orders were placed about 2 months ago however patient states she has not been reached out for the MRI.  She has a pacemaker.  Can you reach out to [facility] imaging and see if they know any other place that we will do MRI with the pacemaker?   Thank you

## 2021-02-08 NOTE — Telephone Encounter (Signed)
Faxed MRI request to [facility],will contact if they can do MRI at the hospital.

## 2021-02-08 NOTE — Telephone Encounter (Signed)
[facility] cannot do an MRI on a patient with a pacemaker.

## 2021-02-08 NOTE — Telephone Encounter (Signed)
[facility] does not know any other place that can do MRI on a patient with pacemaker.

## 2021-02-10 ENCOUNTER — Ambulatory Visit: Payer: Medicare Other | Admitting: Family Medicine

## 2021-02-16 ENCOUNTER — Ambulatory Visit (INDEPENDENT_AMBULATORY_CARE_PROVIDER_SITE_OTHER): Payer: Medicare Other

## 2021-02-16 DIAGNOSIS — R55 Syncope and collapse: Secondary | ICD-10-CM

## 2021-02-16 LAB — CUP PACEART REMOTE DEVICE CHECK
Battery Remaining Longevity: 56 mo
Battery Remaining Percentage: 46 %
Battery Voltage: 2.96 V
Brady Statistic AP VP Percent: 1 %
Brady Statistic AP VS Percent: 21 %
Brady Statistic AS VP Percent: 1 %
Brady Statistic AS VS Percent: 79 %
Brady Statistic RA Percent Paced: 21 %
Brady Statistic RV Percent Paced: 1 %
Date Time Interrogation Session: 20220914022348
Implantable Lead Implant Date: 20151120
Implantable Lead Implant Date: 20151120
Implantable Lead Location: 753862
Implantable Lead Location: 753862
Implantable Pulse Generator Implant Date: 20151120
Lead Channel Impedance Value: 400 Ohm
Lead Channel Impedance Value: 600 Ohm
Lead Channel Pacing Threshold Amplitude: 0.5 V
Lead Channel Pacing Threshold Amplitude: 0.75 V
Lead Channel Pacing Threshold Pulse Width: 0.5 ms
Lead Channel Pacing Threshold Pulse Width: 0.5 ms
Lead Channel Sensing Intrinsic Amplitude: 1.1 mV
Lead Channel Sensing Intrinsic Amplitude: 9.4 mV
Lead Channel Setting Pacing Amplitude: 2 V
Lead Channel Setting Pacing Amplitude: 2 V
Lead Channel Setting Pacing Pulse Width: 0.5 ms
Lead Channel Setting Sensing Sensitivity: 0.5 mV
Pulse Gen Model: 2240
Pulse Gen Serial Number: 7689719

## 2021-02-21 ENCOUNTER — Encounter (HOSPITAL_BASED_OUTPATIENT_CLINIC_OR_DEPARTMENT_OTHER): Payer: Self-pay | Admitting: Emergency Medicine

## 2021-02-21 ENCOUNTER — Telehealth: Payer: Self-pay | Admitting: Family Medicine

## 2021-02-21 ENCOUNTER — Ambulatory Visit (INDEPENDENT_AMBULATORY_CARE_PROVIDER_SITE_OTHER): Payer: Medicare Other | Admitting: Psychologist

## 2021-02-21 ENCOUNTER — Other Ambulatory Visit: Payer: Self-pay

## 2021-02-21 ENCOUNTER — Emergency Department (HOSPITAL_BASED_OUTPATIENT_CLINIC_OR_DEPARTMENT_OTHER)
Admission: EM | Admit: 2021-02-21 | Discharge: 2021-02-21 | Disposition: A | Discharge status: Home / Self Care | Payer: Medicare Other | Attending: Emergency Medicine | Admitting: Emergency Medicine

## 2021-02-21 DIAGNOSIS — N183 Chronic kidney disease, stage 3 unspecified: Secondary | ICD-10-CM | POA: Diagnosis not present

## 2021-02-21 DIAGNOSIS — I129 Hypertensive chronic kidney disease with stage 1 through stage 4 chronic kidney disease, or unspecified chronic kidney disease: Secondary | ICD-10-CM | POA: Diagnosis not present

## 2021-02-21 DIAGNOSIS — R202 Paresthesia of skin: Secondary | ICD-10-CM | POA: Diagnosis not present

## 2021-02-21 DIAGNOSIS — F4310 Post-traumatic stress disorder, unspecified: Secondary | ICD-10-CM

## 2021-02-21 DIAGNOSIS — Z79899 Other long term (current) drug therapy: Secondary | ICD-10-CM | POA: Insufficient documentation

## 2021-02-21 DIAGNOSIS — M79605 Pain in left leg: Secondary | ICD-10-CM

## 2021-02-21 MED ORDER — HYDROCODONE-ACETAMINOPHEN 5-325 MG PO TABS: 2.0000 | ORAL_TABLET | ORAL | 0 refills | Status: AC | PRN

## 2021-02-21 MED ORDER — METHOCARBAMOL 500 MG PO TABS: 500.0000 mg | ORAL_TABLET | Freq: Two times a day (BID) | ORAL | 0 refills | Status: DC

## 2021-02-21 NOTE — ED Triage Notes (Addendum)
Pt arrives to ED with c/o of left sided gluteal pain that started today. Pt reports working out today and lifting 45 pounds on her left leg to her left thigh. Pt heard aloud popping sound and had immediate sharp pain. Pt currently unable to sit without pain.

## 2021-02-21 NOTE — Telephone Encounter (Signed)
I spoke with pt; pt said was working out by lifting 40- 50 lbs wts on ankles and pt heard and felt something pop and pain has been radiating down lt lower back and lt leg; pt wants to know if pulled muscle or ripped muscle; difficult to walk, difficult to breathe or sit down. Pain level now is 8. Pt was nauseated and sweating;. Pain is excruciating and has been non stop. Pt said she is going to [facility] ED. Sending note to Dr Sherrilyn Rist CMA and will teams [place] also.

## 2021-02-21 NOTE — Telephone Encounter (Signed)
Agree with evaluation today given severity of pain

## 2021-02-21 NOTE — Telephone Encounter (Signed)
Pt called in stated she was working out and pop something in her back . Needs to know if she should be seen . Please Advise  #[phone]

## 2021-02-21 NOTE — Discharge Instructions (Addendum)
You were evaluated in the ER today for right leg pain.   It is possible your symptoms are due to a muscle strain. I am prescribing you a short dose of pain medicine and a muscle relaxer. Refrain from drinking alcohol while taking these medications. Do not operate heavy machinery before knowing how these medications affect you.  I've also attached some instructions for caring for this as well as the contact information for the sports medicine clinic we send patients to.   Continue to monitor your symptoms and return to the ED for new or worsening pain, numbness or loss of feeling in your leg, or difficulties walking.

## 2021-02-21 NOTE — ED Provider Notes (Signed)
[facility] EMERGENCY DEPT Provider Note   CSN: [id number] Arrival date & time: 02/21/21  1517     History Chief Complaint  Patient presents with   Muscle Pain    Kim Pennington is a 65 y.o. female with history of stage III renal disease and SA node dysfunction s/p pacemaker, who presents with left sided gluteal pain that started today around 10:30 AM.  Patient states that she was doing a weighted hamstring curl when she felt a pop in her posterior left leg with immediate sharp pain.  Did not drop any weight on her leg.  She states that her pain is made worse with walking, and sitting on her left glute.  Associated tingling sensation originating in her left hip and radiating to her left mid calf.  No other trauma or falls.  She has not tried anything for pain.    Muscle Pain      Past Medical History:  Diagnosis Date   Kidney disease, chronic, stage III (GFR 30-59 ml/min) (HCC)    Pacemaker    Renal failure, chronic, stage 3 (moderate) ([place]) 2015   Left kidney     Patient Active Problem List   Diagnosis Date Noted   Seborrheic keratoses 02/01/2021   Chest pain 02/01/2021   Recurrent sinusitis 02/01/2021   Anemia 11/08/2020   History of melanoma 08/10/2020   Scoliosis 06/29/2020   Hormone replacement therapy (HRT) 06/29/2020   Lymphocytic colitis 06/29/2020   Chronic foot pain 06/29/2020   Left flank discomfort 04/10/2020   H/O right nephrectomy 04/10/2020   Chronic renal insufficiency, stage III (moderate) (HCC) 10/02/2018   Gastroesophageal reflux disease 10/02/2018   Hypertension 10/02/2018   Insomnia 10/02/2018   Obstructive sleep apnea syndrome 10/02/2018   Pacemaker 10/02/2018   Osteoporosis 10/02/2018   Sinoatrial node dysfunction ([place]) 10/02/2018   Recurrent major depressive disorder ([place]) 10/02/2018   Posttraumatic stress disorder 10/02/2018   Cobalamin deficiency 10/02/2018   Arthritis 10/02/2018   Thoracic spine pain 10/02/2018    Enthesopathy of hip region 10/02/2018   Kidney stone 10/02/2018   Migraine headache 11/18/2012   Left flank pain 06/19/2012    Past Surgical History:  Procedure Laterality Date   ABDOMINAL HYSTERECTOMY     BILATERAL CARPAL TUNNEL RELEASE     BREAST SURGERY     CESAREAN SECTION     x4   FOOT SURGERY     HERNIA REPAIR     HIP SURGERY     reduction of lesser trochanter    LAPAROSCOPIC BILATERAL SALPINGO OOPHERECTOMY     left thumb tendon     NEPHRECTOMY     NEPHROSTOMY     PACEMAKER IMPLANT     RHINOPLASTY     ROTATOR CUFF REPAIR     TONSILECTOMY/ADENOIDECTOMY WITH MYRINGOTOMY       OB History   No obstetric history on file.     Family History  Problem Relation Age of Onset   Diabetes Mother    Heart disease Father    Hyperlipidemia Sister    Hypertension Sister    Hyperlipidemia Brother    Hypertension Brother    Hyperlipidemia Brother    Hypertension Brother    Hyperlipidemia Sister    Hypertension Sister    Diabetes Sister    Breast cancer Sister 33       x2   Hyperlipidemia Sister    Hypertension Sister    Hyperlipidemia Sister    Hypertension Sister     Social History  Tobacco Use   Smoking status: Never   Smokeless tobacco: Never  Vaping Use   Vaping Use: Never used  Substance Use Topics   Alcohol use: Never   Drug use: Never    Home Medications Prior to Admission medications   Medication Sig Start Date End Date Taking? Authorizing Provider  HYDROcodone-acetaminophen (NORCO/VICODIN) 5-325 MG tablet Take 2 tablets by mouth every 4 (four) hours as needed for up to 3 days. 02/21/21 02/24/21 Yes Alira Fretwell T, PA-C  methocarbamol (ROBAXIN) 500 MG tablet Take 1 tablet (500 mg total) by mouth 2 (two) times daily. 02/21/21  Yes Kymir Coles T, PA-C  alendronate (FOSAMAX) 70 MG tablet Take 1 tablet (70 mg total) by mouth once a week. 06/29/20   [name], MD  ALPRAZolam Duanne Moron) 1 MG tablet Take 1 tablet (1 mg total) by mouth at bedtime as  needed. 08/10/20   [name], MD  amLODipine (NORVASC) 10 MG tablet Take 1 tablet (10 mg total) by mouth daily. 06/29/20   [name], MD  atorvastatin (LIPITOR) 80 MG tablet Take 1 tablet (80 mg total) by mouth daily. 06/29/20   [name], MD  budesonide (ENTOCORT EC) 3 MG 24 hr capsule Take 3 mg by mouth as needed.    [provider]  Calcium Carbonate-Vit D-Min (CALCIUM 1200 PO) Take 1 tablet by mouth daily.    [provider]  chlorpheniramine-HYDROcodone (TUSSIONEX) 10-8 MG/5ML SUER Take 5 mLs by mouth every 12 (twelve) hours as needed for cough (cough). 02/01/21   [name], MD  CRANBERRY-CALCIUM PO Take 1 tablet by mouth daily.    [provider]  estradiol (ESTRACE) 0.5 MG tablet Take 1 tablet (0.5 mg total) by mouth daily. 06/29/20   [name], MD  losartan (COZAAR) 25 MG tablet Take 1 tablet (25 mg total) by mouth daily. 06/29/20   [name], MD  montelukast (SINGULAIR) 10 MG tablet Take 10 mg by mouth at bedtime.    [provider]  vitamin B-12 (CYANOCOBALAMIN) 1000 MCG tablet Take 1 tablet (1,000 mcg total) by mouth daily. 06/29/20   [name], MD  zolpidem (AMBIEN) 5 MG tablet Take 0.5 tablets (2.5 mg total) by mouth at bedtime. 08/10/20   [name], MD    Allergies    Tape  Review of Systems   Review of Systems  Musculoskeletal:        Left leg pain  All other systems reviewed and are negative.  Physical Exam Updated Vital Signs BP 138/78 (BP Location: Right Arm)   Pulse 85   Temp 98.2 F (36.8 C) (Oral)   Resp 17   Ht '5\' 3"'$  (1.6 m)   Wt 59 kg   SpO2 98%   BMI 23.03 kg/m   Physical Exam Vitals and nursing note reviewed.  Constitutional:      Appearance: Normal appearance.  HENT:     Head: Normocephalic and atraumatic.  Eyes:     Conjunctiva/sclera: Conjunctivae normal.  Pulmonary:     Effort: Pulmonary effort is normal. No respiratory distress.  Musculoskeletal:     Comments: Normal  passive ROM of left hip and left knee. Pulses normal in bilateral LE. Sensation in tact. TTP of posterior left glute with mild swelling.  Skin:    General: Skin is warm and dry.  Neurological:     Mental Status: She is alert.  Psychiatric:        Mood and Affect:  Mood normal.        Behavior: Behavior normal.    ED Results / Procedures / Treatments   Labs (all labs ordered are listed, but only abnormal results are displayed) Labs Reviewed - No data to display  EKG None  Radiology No results found.  Procedures Procedures   Medications Ordered in ED Medications - No data to display  ED Course  I have reviewed the triage vital signs and the nursing notes.  Pertinent labs & imaging results that were available during my care of the patient were reviewed by me and considered in my medical decision making (see chart for details).    MDM Rules/Calculators/A&P                           Patient is 65 year old female who presents with left-sided gluteal pain that started this morning she was working out.  On exam patient has full passive range of motion of her left hip and knee.  Pulses are normal in bilateral lower extremities.  Sensation intact bilaterally.  She does have some tenderness to palpation of her left posterior glutes, with some associated swelling.  No deformities palpated.  She is able to ambulate with some difficulty.  Discussed with patient that imaging at this time would likely not be beneficial.  Little concern for fracture due to mechanism of injury.  Due to mechanism as well as relatively benign exam, patient's symptoms are likely due to a muscle strain.  Discussed potential imaging for muscle tear including MRI.  Patient does have a pacemaker in place which makes imaging more complicated.  She is neurovascularly intact and not requiring admission or inpatient treatment for symptoms at this time.  Discussed importance of following up with sports medicine.  Plan to  discharge with prescriptions for pain medication and muscle relaxer.  Discussed reasons to return to the ER.  Patient agreeable to plan.  Final Clinical Impression(s) / ED Diagnoses Final diagnoses:  Left leg pain    Rx / DC Orders ED Discharge Orders          Ordered    HYDROcodone-acetaminophen (NORCO/VICODIN) 5-325 MG tablet  Every 4 hours PRN        02/21/21 1941    methocarbamol (ROBAXIN) 500 MG tablet  2 times daily        02/21/21 1941             Rimsha Trembley, Kathleen Argue 02/21/21 2013    Regan Lemming, MD 02/21/21 2334

## 2021-02-23 DIAGNOSIS — S76312A Strain of muscle, fascia and tendon of the posterior muscle group at thigh level, left thigh, initial encounter: Secondary | ICD-10-CM | POA: Diagnosis not present

## 2021-02-23 NOTE — Progress Notes (Signed)
Remote pacemaker transmission.   

## 2021-03-07 DIAGNOSIS — J301 Allergic rhinitis due to pollen: Secondary | ICD-10-CM | POA: Diagnosis not present

## 2021-03-07 DIAGNOSIS — J329 Chronic sinusitis, unspecified: Secondary | ICD-10-CM | POA: Diagnosis not present

## 2021-03-07 DIAGNOSIS — J309 Allergic rhinitis, unspecified: Secondary | ICD-10-CM | POA: Diagnosis not present

## 2021-03-09 DIAGNOSIS — J329 Chronic sinusitis, unspecified: Secondary | ICD-10-CM | POA: Diagnosis not present

## 2021-03-10 DIAGNOSIS — M6281 Muscle weakness (generalized): Secondary | ICD-10-CM | POA: Diagnosis not present

## 2021-03-10 DIAGNOSIS — M25552 Pain in left hip: Secondary | ICD-10-CM | POA: Diagnosis not present

## 2021-03-14 ENCOUNTER — Ambulatory Visit: Payer: Medicare Other | Admitting: Psychologist

## 2021-03-15 DIAGNOSIS — M25552 Pain in left hip: Secondary | ICD-10-CM | POA: Diagnosis not present

## 2021-03-15 DIAGNOSIS — M6281 Muscle weakness (generalized): Secondary | ICD-10-CM | POA: Diagnosis not present

## 2021-03-23 DIAGNOSIS — S76312A Strain of muscle, fascia and tendon of the posterior muscle group at thigh level, left thigh, initial encounter: Secondary | ICD-10-CM | POA: Diagnosis not present

## 2021-03-24 ENCOUNTER — Ambulatory Visit: Payer: Medicare Other | Admitting: Podiatry

## 2021-03-24 ENCOUNTER — Other Ambulatory Visit: Payer: Self-pay

## 2021-03-24 DIAGNOSIS — M7752 Other enthesopathy of left foot: Secondary | ICD-10-CM | POA: Diagnosis not present

## 2021-03-24 DIAGNOSIS — M7751 Other enthesopathy of right foot: Secondary | ICD-10-CM

## 2021-03-24 MED ORDER — LIDOCAINE 5 % EX PTCH: 1.0000 | MEDICATED_PATCH | CUTANEOUS | 0 refills | Status: DC

## 2021-03-30 ENCOUNTER — Encounter: Payer: Self-pay | Admitting: Podiatry

## 2021-03-30 DIAGNOSIS — R0981 Nasal congestion: Secondary | ICD-10-CM | POA: Diagnosis not present

## 2021-03-30 NOTE — Progress Notes (Signed)
Subjective:  Patient ID: Kim Pennington, female    DOB: 1956/01/18,  MRN: [id number]  Chief Complaint  Patient presents with   Foot Pain    Pt stated that she is still having a lot of pain with her feet     65 y.o. female presents with the above complaint.  Patient presents with bilateral foot pain from the surgery that she had done back in [place].  She states that she since the operative note and.  However patient not able to get an MRI due to pacemaker.  She would like to try to get it at [facility].  Review of Systems: Negative except as noted in the HPI. Denies N/V/F/Ch.  Past Medical History:  Diagnosis Date   Kidney disease, chronic, stage III (GFR 30-59 ml/min) (HCC)    Pacemaker    Renal failure, chronic, stage 3 (moderate) ([place]) 2015   Left kidney     Current Outpatient Medications:    lidocaine (LIDODERM) 5 %, Place 1 patch onto the skin daily. Remove & Discard patch within 12 hours or as directed by MD, Disp: 30 patch, Rfl: 0   alendronate (FOSAMAX) 70 MG tablet, Take 1 tablet (70 mg total) by mouth once a week., Disp: 12 tablet, Rfl: 3   ALPRAZolam (XANAX) 1 MG tablet, Take 1 tablet (1 mg total) by mouth at bedtime as needed., Disp: 90 tablet, Rfl: 1   amLODipine (NORVASC) 10 MG tablet, Take 1 tablet (10 mg total) by mouth daily., Disp: 90 tablet, Rfl: 3   atorvastatin (LIPITOR) 80 MG tablet, Take 1 tablet (80 mg total) by mouth daily., Disp: 90 tablet, Rfl: 3   budesonide (ENTOCORT EC) 3 MG 24 hr capsule, Take 3 mg by mouth as needed., Disp: , Rfl:    Calcium Carbonate-Vit D-Min (CALCIUM 1200 PO), Take 1 tablet by mouth daily., Disp: , Rfl:    chlorpheniramine-HYDROcodone (TUSSIONEX) 10-8 MG/5ML SUER, Take 5 mLs by mouth every 12 (twelve) hours as needed for cough (cough)., Disp: 140 mL, Rfl: 0   CRANBERRY-CALCIUM PO, Take 1 tablet by mouth daily., Disp: , Rfl:    estradiol (ESTRACE) 0.5 MG tablet, Take 1 tablet (0.5 mg total) by mouth daily., Disp: 90 tablet, Rfl: 3    losartan (COZAAR) 25 MG tablet, Take 1 tablet (25 mg total) by mouth daily., Disp: 90 tablet, Rfl: 3   methocarbamol (ROBAXIN) 500 MG tablet, Take 1 tablet (500 mg total) by mouth 2 (two) times daily., Disp: 20 tablet, Rfl: 0   montelukast (SINGULAIR) 10 MG tablet, Take 10 mg by mouth at bedtime., Disp: , Rfl:    vitamin B-12 (CYANOCOBALAMIN) 1000 MCG tablet, Take 1 tablet (1,000 mcg total) by mouth daily., Disp: 90 tablet, Rfl: 3   zolpidem (AMBIEN) 5 MG tablet, Take 0.5 tablets (2.5 mg total) by mouth at bedtime., Disp: 45 tablet, Rfl: 1  Social History   Tobacco Use  Smoking Status Never  Smokeless Tobacco Never    Allergies  Allergen Reactions   Tape Hives    Tape adhesive long period of time    Objective:  There were no vitals filed for this visit. There is no height or weight on file to calculate BMI. Constitutional Well developed. Well nourished.  Vascular Dorsalis pedis pulses palpable bilaterally. Posterior tibial pulses palpable bilaterally. Capillary refill normal to all digits.  No cyanosis or clubbing noted. Pedal hair growth normal.  Neurologic Normal speech. Oriented to person, place, and time. Epicritic sensation to light touch grossly present  bilaterally.  Dermatologic Nails well groomed and normal in appearance. No open wounds. No skin lesions.  Orthopedic:  Bilateral second interspace neuroma versus capsulitis.  Positive Mulder's click.  Positive [place] sign left greater than right side.  Previous scarring/surgical scarring noted.  Pain on palpation to the left first tarsometatarsal joint.  Mild osteoarthritic changes were clinically palpated.  No pain with resisted dorsiflexion and plantarflexion of the digit ruling out tendinitis   Radiographs: Bilateral second interspace: 3 views of skeletally mature adult bilateral foot: Previous hardware noted in the metatarsal second and third.  No sign of loosening or breaking noted.  There appear to be snap off screws.   Positive [place] sign noted on x-ray.  No elevatus noted.  Hardware appears intact Assessment:   1. Capsulitis of metatarsophalangeal (MTP) joint of left foot   2. Capsulitis of metatarsophalangeal (MTP) joint of right foot     Plan:  Patient was evaluated and treated and all questions answered.  Left first tarsometatarsal joint capsulitis -I explained the patient the etiology of capsulitis versus treatment options were discussed.  I believe patient will benefit from a steroid shot to decrease acute inflammatory component associated pain.  She will still work on getting the operative note for me to assess exactly what the surgical was done versus neuroma excision as opposed to osteotomies in the bone as are apparent radiographically.  If no improvement we will discuss getting an MRI during next clinical visit.  Bilateral second interspace neuroma/capsulitis left greater than right -I explained to patient the etiology of neuroma and capsulitis and various treatment options were discussed.  Given that patient has previous surgery done to both of the feet with associated scarring in the interspace of second I believe there is likely a lot of scar tissue/vibratory tissue that could be causing her pain.  I believe patient may benefit from a steroid injection help decrease the pain associated with inflammation.  Patient agrees with the plan would like to proceed with injection. -A third steroid injection was performed at bilateral second MPJ using 1% plain Lidocaine and 10 mg of Kenalog. This was well tolerated. -Since the left side is worse than the right side I placed her in a surgical shoe to the left side. -Patient unable to get an MRI due to pacemaker.  We will attempt to try to get it at [facility].  No follow-ups on file.

## 2021-04-06 ENCOUNTER — Encounter: Payer: Medicare Other | Admitting: Family Medicine

## 2021-04-12 ENCOUNTER — Telehealth: Payer: Self-pay | Admitting: *Deleted

## 2021-04-12 NOTE — Telephone Encounter (Signed)
Faxed referral for MRI, RT,LT foot w/o contrast,received confirmation 04/12/21.

## 2021-04-14 DIAGNOSIS — N130 Hydronephrosis with ureteropelvic junction obstruction: Secondary | ICD-10-CM | POA: Diagnosis not present

## 2021-04-14 DIAGNOSIS — Z905 Acquired absence of kidney: Secondary | ICD-10-CM | POA: Diagnosis not present

## 2021-04-15 ENCOUNTER — Telehealth: Payer: Self-pay | Admitting: *Deleted

## 2021-04-15 NOTE — Telephone Encounter (Signed)
Whitney w/ [facility] MRI scheduling is calling to let the doctor know that the radiologist said that since patient has a conditional study, would he like to explore other options or test because of elevated risk with pacemaker.(Possibly CT?). May call the radiologist to discuss at: [phone].

## 2021-04-15 NOTE — Telephone Encounter (Signed)
Called [facility] radiology scheduling for the status of MRI,spoke with Kima and she sent a message to the dept.that schedules MRI w/ pacemakers, someone from their office will contact patient to schedule, if patient has not heard anything by Monday to give them a call.[phone])

## 2021-04-19 ENCOUNTER — Telehealth: Payer: Self-pay | Admitting: Primary Care

## 2021-04-19 NOTE — Telephone Encounter (Addendum)
Updated labs for patient renal function. Should receive cc'd notes with labs   ----- Message from Festus Aloe, MD sent at 04/18/2021  4:58 PM EST ----- [facility] pt I saw recently - looks like Dr. Einar Pheasant wanted to repeat her kidney function.  I saw her in the office and repeated it and her BUN was 23 and creatinine 0.8 on April 14, 2021 - looks good!   Just wanted to let you all know.  Thanks.  [facility]

## 2021-05-11 ENCOUNTER — Telehealth: Payer: Self-pay | Admitting: Cardiology

## 2021-05-11 ENCOUNTER — Telehealth: Payer: Self-pay | Admitting: Podiatry

## 2021-05-11 ENCOUNTER — Telehealth: Payer: Self-pay | Admitting: *Deleted

## 2021-05-11 NOTE — Telephone Encounter (Signed)
  1. Has your device fired? no  2. Is you device beeping? no  3. Are you experiencing draining or swelling at device site? no  4. Are you calling to see if we received your device transmission? No   5. Have you passed out? No  Patient needs to have an mri and was told there is no mri she can have that is compatible with her device.   She was told she needs a new device that is compatible .  Please call to discuss as patient has never had this issue before .     Please route to [place]

## 2021-05-11 NOTE — Telephone Encounter (Signed)
Patient left message on nurse VM - she was scheduled for an MRI at [facility], but they can't do it because of her pacemaker. She has found that [facility] will do it for her but needs the order faxed. Fax number: [phone], attention: Ronalee Belts

## 2021-05-11 NOTE — Telephone Encounter (Signed)
Patient device is not MRI compatible. Spoke to patient and explained. Appreciative of phone call.

## 2021-05-11 NOTE — Telephone Encounter (Signed)
Good morning we received call from [facility] regarding the MRI for BIL feet - they can not do this imaging due to the patient pacemaker not being MRI compatible.  How would you like to proceed?

## 2021-05-11 NOTE — Telephone Encounter (Signed)
Spoke with patient about not being able to proceed with MRI because of pacemaker not being compatible , and she wants to know if there is any place in [place] that could do the MRI .  I suggested that she call her EP doctor and inquire with them.

## 2021-05-16 ENCOUNTER — Telehealth: Payer: Self-pay

## 2021-05-16 NOTE — Telephone Encounter (Signed)
Patient left a message on the nurse voicemail. She has new information about a facility that will do her MRI's (pacemaker) Please call to discuss [phone]

## 2021-05-18 ENCOUNTER — Ambulatory Visit (INDEPENDENT_AMBULATORY_CARE_PROVIDER_SITE_OTHER): Payer: Medicare Other

## 2021-05-18 DIAGNOSIS — R55 Syncope and collapse: Secondary | ICD-10-CM

## 2021-05-18 LAB — CUP PACEART REMOTE DEVICE CHECK
Battery Remaining Longevity: 53 mo
Battery Remaining Percentage: 44 %
Battery Voltage: 2.96 V
Brady Statistic AP VP Percent: 1 %
Brady Statistic AP VS Percent: 19 %
Brady Statistic AS VP Percent: 1 %
Brady Statistic AS VS Percent: 81 %
Brady Statistic RA Percent Paced: 18 %
Brady Statistic RV Percent Paced: 1 %
Date Time Interrogation Session: 20221214020013
Implantable Lead Implant Date: 20151120
Implantable Lead Implant Date: 20151120
Implantable Lead Location: 753862
Implantable Lead Location: 753862
Implantable Pulse Generator Implant Date: 20151120
Lead Channel Impedance Value: 400 Ohm
Lead Channel Impedance Value: 630 Ohm
Lead Channel Pacing Threshold Amplitude: 0.5 V
Lead Channel Pacing Threshold Amplitude: 0.75 V
Lead Channel Pacing Threshold Pulse Width: 0.5 ms
Lead Channel Pacing Threshold Pulse Width: 0.5 ms
Lead Channel Sensing Intrinsic Amplitude: 1 mV
Lead Channel Sensing Intrinsic Amplitude: 7.2 mV
Lead Channel Setting Pacing Amplitude: 2 V
Lead Channel Setting Pacing Amplitude: 2 V
Lead Channel Setting Pacing Pulse Width: 0.5 ms
Lead Channel Setting Sensing Sensitivity: 0.5 mV
Pulse Gen Model: 2240
Pulse Gen Serial Number: 7689719

## 2021-05-18 NOTE — Telephone Encounter (Signed)
Tried calling patient to get information, no answer, could not leave a voice message.Will try again later.

## 2021-05-18 NOTE — Telephone Encounter (Signed)
Called and faxed [phone]) MRI orders to [facility]([phone]), spoke with them and they do pacemakers, will contact the patient to schedule appointment.

## 2021-05-19 ENCOUNTER — Telehealth: Payer: Self-pay | Admitting: *Deleted

## 2021-05-19 NOTE — Telephone Encounter (Signed)
Juliann Pulse w/ [place] [phone]) is calling because insurance has to be checked before scheduling MRI appointment  [facility], spoke with [facility] C[facility]no prior authorization is needed for MRI. Returned the call and spoke with [facility] w/ [place] giving that information, faxed the information as well, received confirmation 05/19/21.

## 2021-05-23 DIAGNOSIS — M1712 Unilateral primary osteoarthritis, left knee: Secondary | ICD-10-CM | POA: Diagnosis not present

## 2021-05-23 DIAGNOSIS — M170 Bilateral primary osteoarthritis of knee: Secondary | ICD-10-CM | POA: Diagnosis not present

## 2021-05-23 DIAGNOSIS — M25562 Pain in left knee: Secondary | ICD-10-CM | POA: Diagnosis not present

## 2021-05-23 DIAGNOSIS — M25561 Pain in right knee: Secondary | ICD-10-CM | POA: Diagnosis not present

## 2021-05-27 NOTE — Progress Notes (Signed)
Remote pacemaker transmission.   

## 2021-06-08 ENCOUNTER — Other Ambulatory Visit: Payer: Self-pay | Admitting: Orthopedic Surgery

## 2021-06-08 ENCOUNTER — Other Ambulatory Visit: Payer: Self-pay | Admitting: Family Medicine

## 2021-06-08 ENCOUNTER — Other Ambulatory Visit (HOSPITAL_COMMUNITY): Payer: Self-pay | Admitting: Orthopedic Surgery

## 2021-06-08 DIAGNOSIS — M899 Disorder of bone, unspecified: Secondary | ICD-10-CM

## 2021-06-16 DIAGNOSIS — F4310 Post-traumatic stress disorder, unspecified: Secondary | ICD-10-CM | POA: Diagnosis not present

## 2021-06-23 ENCOUNTER — Other Ambulatory Visit: Payer: Self-pay | Admitting: Primary Care

## 2021-06-23 ENCOUNTER — Other Ambulatory Visit: Payer: Self-pay | Admitting: Family Medicine

## 2021-06-23 DIAGNOSIS — F5104 Psychophysiologic insomnia: Secondary | ICD-10-CM

## 2021-07-04 DIAGNOSIS — M7751 Other enthesopathy of right foot: Secondary | ICD-10-CM | POA: Diagnosis not present

## 2021-07-04 DIAGNOSIS — M899 Disorder of bone, unspecified: Secondary | ICD-10-CM | POA: Diagnosis not present

## 2021-07-04 DIAGNOSIS — R937 Abnormal findings on diagnostic imaging of other parts of musculoskeletal system: Secondary | ICD-10-CM | POA: Diagnosis not present

## 2021-07-04 DIAGNOSIS — Z950 Presence of cardiac pacemaker: Secondary | ICD-10-CM | POA: Diagnosis not present

## 2021-07-04 DIAGNOSIS — M659 Synovitis and tenosynovitis, unspecified: Secondary | ICD-10-CM | POA: Diagnosis not present

## 2021-07-04 DIAGNOSIS — M7752 Other enthesopathy of left foot: Secondary | ICD-10-CM | POA: Diagnosis not present

## 2021-07-13 ENCOUNTER — Telehealth: Payer: Self-pay | Admitting: Podiatry

## 2021-07-13 NOTE — Telephone Encounter (Signed)
I called and requested MRI disc of both feet from [place] with Lenna Sciara and she will get them mailed out to the [place] office    Patient has been notified that MRI disc has been requested and Dr. Posey Pronto will contact her once he receives disc.

## 2021-07-13 NOTE — Telephone Encounter (Signed)
Patient called asking for her resluts of her MRI.

## 2021-07-15 ENCOUNTER — Other Ambulatory Visit: Payer: Self-pay | Admitting: Family Medicine

## 2021-07-16 IMAGING — MG MM DIGITAL SCREENING BILAT W/ TOMO AND CAD
6 of 10 series · 6 of 30 positions shown · non-contrast
Comparison: Previous exam(s).

CLINICAL DATA: Screening.

EXAM:
DIGITAL SCREENING BILATERAL MAMMOGRAM WITH TOMOSYNTHESIS AND CAD
TECHNIQUE: Bilateral screening digital craniocaudal and mediolateral oblique
mammograms were obtained. Bilateral screening digital breast
tomosynthesis was performed. The images were evaluated with
computer-aided detection.

[R CC synth-2D]
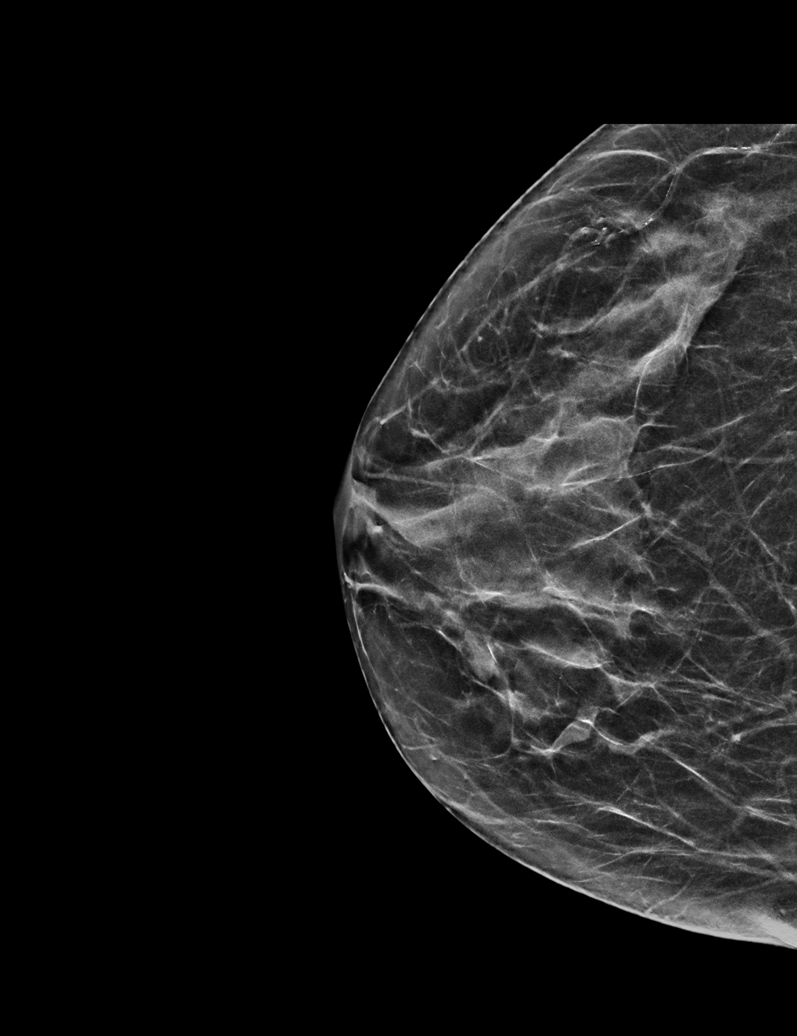

[L CC synth-2D]
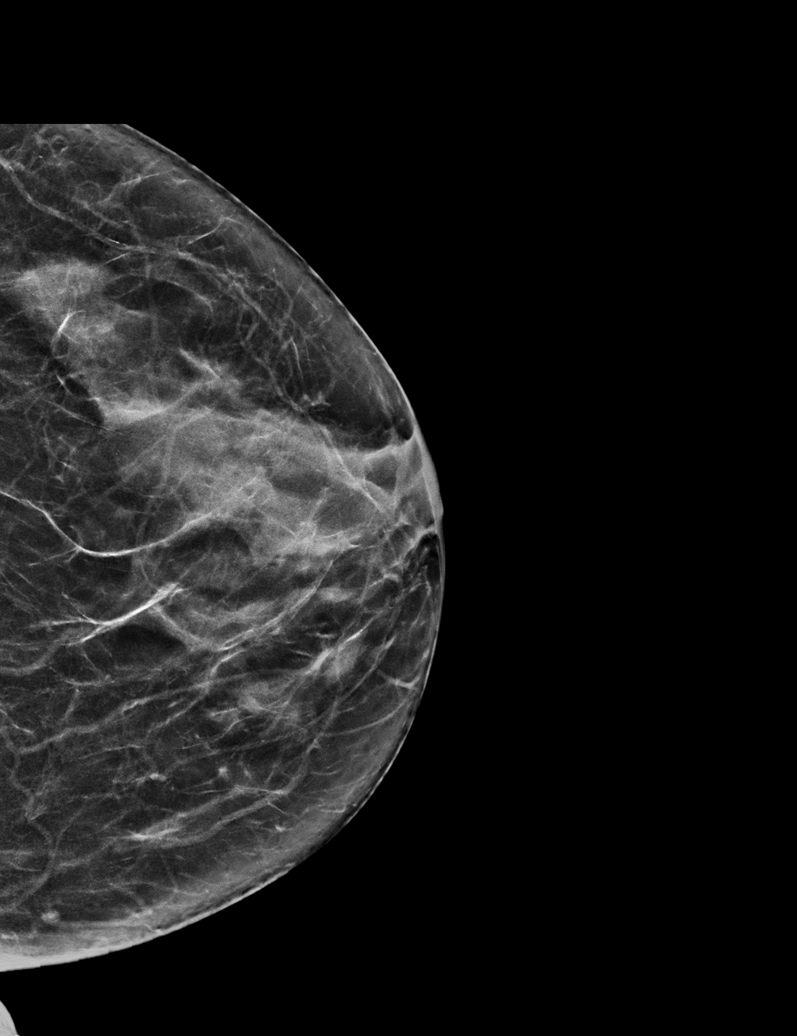

[R MLO synth-2D]
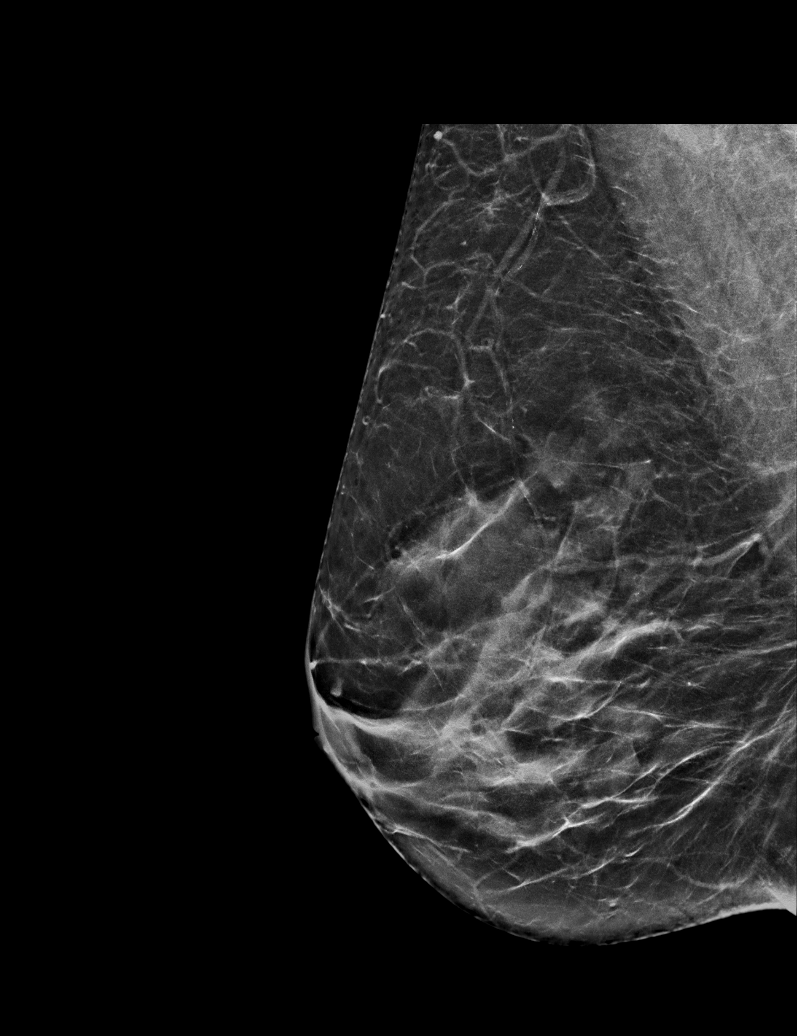

[L MLO synth-2D (1 of 2)]
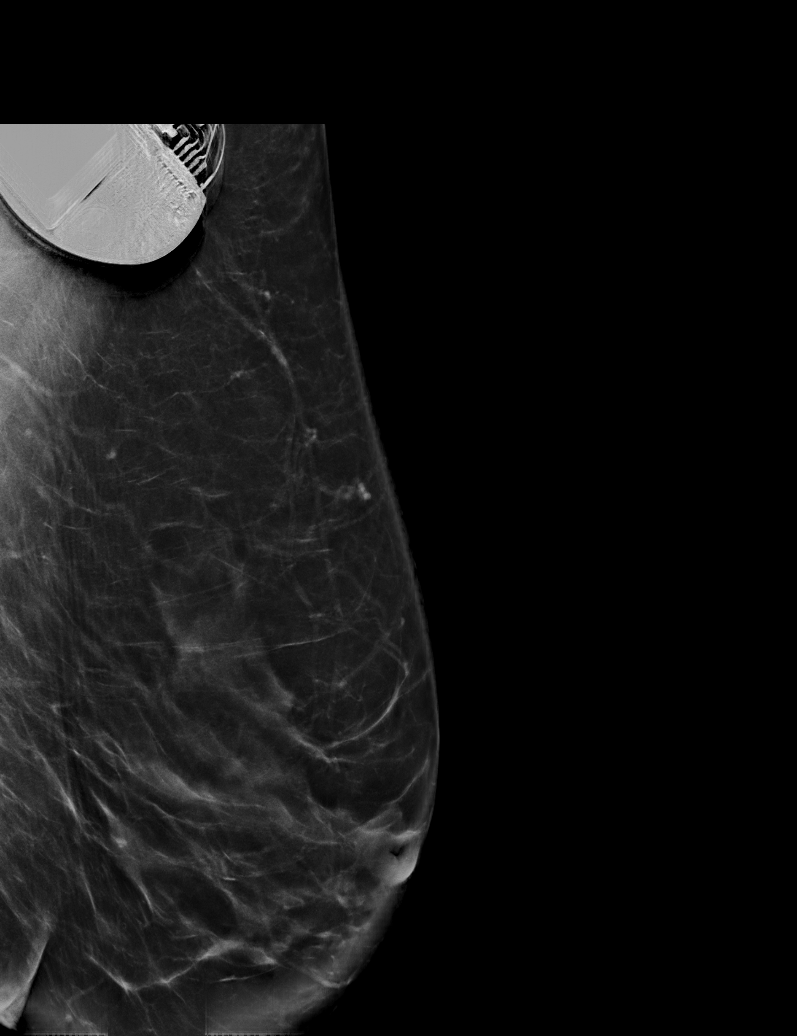

[L MLO synth-2D (2 of 2)]
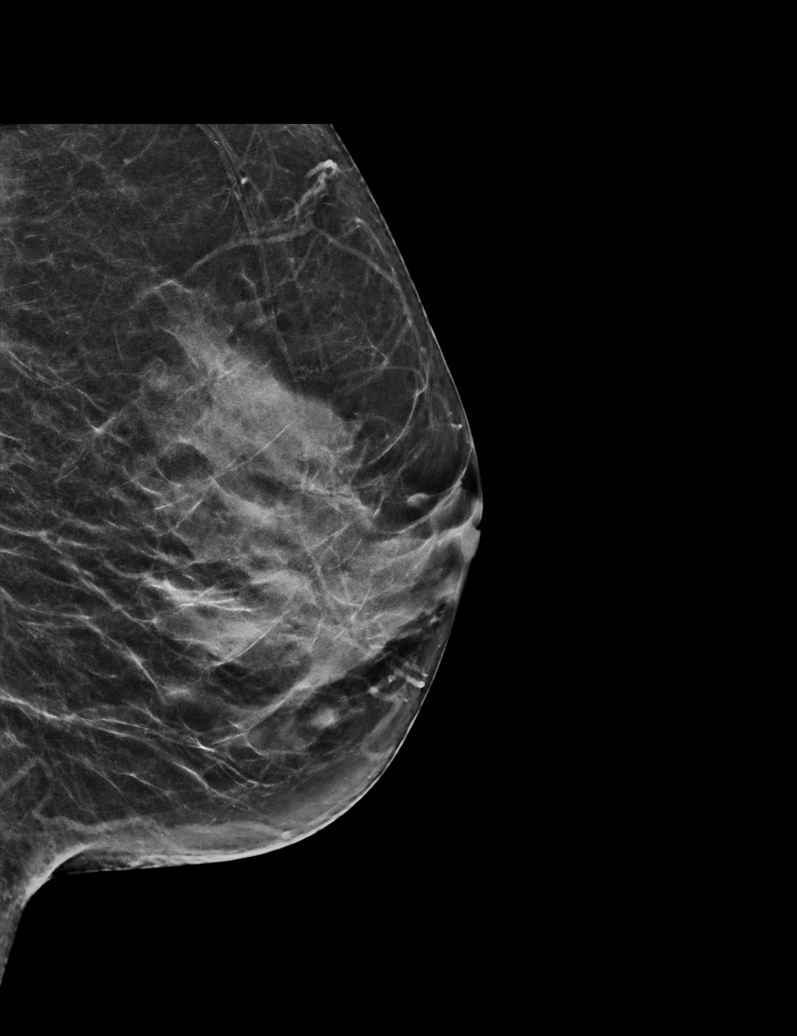

[L MLO tomo · tomo slice 31/60.0]
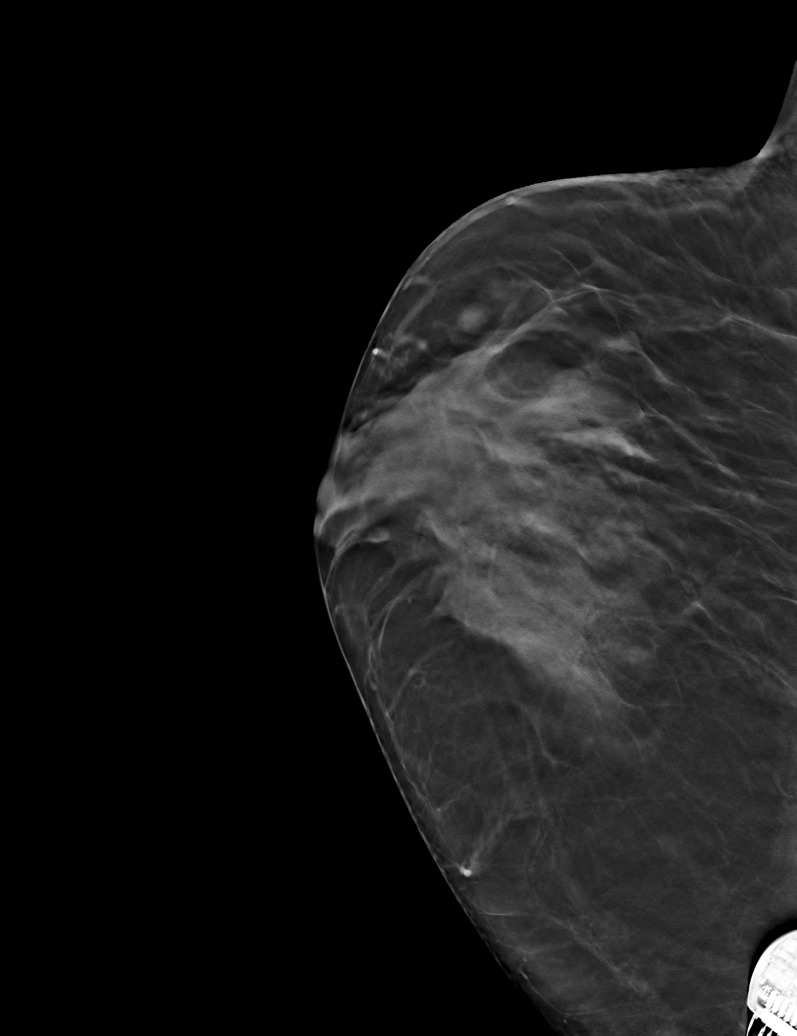

[6 of 30 positions shown; findings below may reference images not displayed]

ACR Breast Density Category c: The breast tissue is heterogeneously
dense, which may obscure small masses.
FINDINGS: In the right breast, a possible focal asymmetry warrants further
evaluation. In the left breast, no findings suspicious for
malignancy.
IMPRESSION: Further evaluation is suggested for possible focal asymmetry in the
right breast.

RECOMMENDATION:
Diagnostic mammogram and possibly ultrasound of the right breast.
(Code:[id number]-00P)

The patient will be contacted regarding the findings, and additional
imaging will be scheduled.

BI-RADS CATEGORY  0: Incomplete. Need additional imaging evaluation
and/or prior mammograms for comparison.

## 2021-07-18 NOTE — Telephone Encounter (Signed)
LMTCB, I have scheduled pt for CPE on June

## 2021-07-21 ENCOUNTER — Telehealth: Payer: Self-pay

## 2021-07-21 NOTE — Telephone Encounter (Signed)
[place] Night - Client Nonclinical Telephone Record  [facility] Client [place] Night - Client Client Site [place] Provider Waunita Schooner- MD Contact Type Call Who Is Calling Patient / Member / Family / Caregiver Caller Name [place] Phone Number [phone] Patient Name Kim Pennington Patient DOB 05-16-1956 Call Type Message Only Information Provided Reason for Call Request to Schedule Office Appointment Initial Comment Caller states she wants to schedule annual wellness checkup. Patient request to speak to RN No Additional Comment hrs prov Disp. Time Disposition Final User 07/20/2021 5:17:53 PM General Information Provided Yes Idolina Primer Call Closed By: Idolina Primer Transaction Date/Time: 07/20/2021 5:16:22 PM (ET

## 2021-07-25 ENCOUNTER — Telehealth: Payer: Self-pay | Admitting: *Deleted

## 2021-07-25 NOTE — Telephone Encounter (Signed)
PLEASE NOTE: All timestamps contained within this report are represented as [place] Standard Time. CONFIDENTIALTY NOTICE: This fax transmission is intended only for the addressee. It contains information that is legally privileged, confidential or otherwise protected from use or disclosure. If you are not the intended recipient, you are strictly prohibited from reviewing, disclosing, copying using or disseminating any of this information or taking any action in reliance on or regarding this information. If you have received this fax in error, please notify us immediately by telephone so that we can arrange for its return to [place]. Phone: [phone], Toll-Free: [phone], Fax: [phone] Page: 1 of 2 Call Id: [id number] [place] [facility] - Client TELEPHONE ADVICE RECORD [facility] Patient Name: Kim Pennington [facility] Gender: Female DOB: 03/04/1956 Age: 66 Y 17 M 10 D Return Phone Number: [phone] (Primary) Address: City/ State/ Zip: [place] [address] Client [place] [facility] - Client Client Site [place] - Day Provider Waunita Schooner- MD Contact Type Call Who Is Calling Patient / Member / Family / Caregiver Call Type Triage / Clinical Relationship To Patient Self Return Phone Number [phone] (Primary) Chief Complaint Swallowing Difficulty Reason for Call Symptomatic / Request for [place] states for a rash around mouth and swelling difficulty. Location confirmed. Translation No Nurse Assessment Nurse: Burnadette Peter, RN, Elmo Putt Date/Time (Eastern Time): 07/25/2021 4:11:46 PM Confirm and document reason for call. If symptomatic, describe symptoms. ---Caller states for a rash around mouth and swallowing difficulty. Caller states she thinks it could be from a flare up of Lymphocytic colitis. Caller states she is having blisters inside her mouth. Caller denies fever and difficulty  breathing. Does the patient have any new or worsening symptoms? ---Yes Will a triage be completed? ---Yes Related visit to physician within the last 2 weeks? ---No Does the PT have any chronic conditions? (i.e. diabetes, asthma, this includes High risk factors for pregnancy, etc.) ---Yes List chronic conditions. ---Lymphocytic colitis, stage 3 renal failure (left kidney) Is this a behavioral health or substance abuse call? ---No Guidelines Guideline Title Affirmed Question Affirmed Notes Nurse Date/Time Eilene Ghazi Time) Swallowing Difficulty [1] Swollen tongue AND [2] sudden onset Wynona Canes 4/82/5003 7:04:88 PM Disp. Time Eilene Ghazi Time) Disposition Final User 07/25/2021 4:18:41 PM 911 Outcome Documentation Burnadette Peter, RN, Elmo Putt Reason: EMS refused PLEASE NOTE: All timestamps contained within this report are represented as [place] Standard Time. CONFIDENTIALTY NOTICE: This fax transmission is intended only for the addressee. It contains information that is legally privileged, confidential or otherwise protected from use or disclosure. If you are not the intended recipient, you are strictly prohibited from reviewing, disclosing, copying using or disseminating any of this information or taking any action in reliance on or regarding this information. If you have received this fax in error, please notify us immediately by telephone so that we can arrange for its return to [place]. Phone: [phone], Toll-Free: [phone], Fax: [phone] Page: 2 of 2 Call Id: [id number] 07/25/2021 4:17:28 PM Call EMS 911 Now Yes Burnadette Peter, RN, Laren Everts Disagree/Comply Disagree Caller Understands Yes PreDisposition InappropriateToAsk Care Advice Given Per Guideline CALL EMS 911 NOW: * Immediate medical attention is needed. You need to hang up and call 911 (or an ambulance). * Triager Discretion: I'll call you back in a few minutes to be sure you were able to reach them. CARE ADVICE given per  Swallowed Difficulty (Adult) guideline. Comments User: Braxton Feathers, RN Date/Time Eilene Ghazi Time): 07/25/2021 4:14:50 PM Caller states she  has never had a lot of fever blisters or a swollen tongue before. User: Braxton Feathers, RN Date/Time Eilene Ghazi Time): 07/25/2021 4:15:04 PM Caller states she has to continuously clear her throat. User: Braxton Feathers, RN Date/Time Eilene Ghazi Time): 07/25/2021 4:16:46 PM Caller states her tongue started swelling yesterday and has blisters on them too.

## 2021-07-25 NOTE — Telephone Encounter (Signed)
Spoke to patient by telephone and was advised that she was told to call 911 by the previous nurse that she talked to. Patient stated that she is not going to do that and is not going to the ER or UC because they do not do anything for her. Patient stated that she is on the way home to check on her husband because he is having some problems. Patient stated that she has had these symptoms before but not as bad as this time.. Patient stated that she will just call he office in the morning to schedule an appointment with Dr. Einar Pheasant. Patient was advised that the recommendation is that she go to either an UC or ER today which she declined stating that she is not unless her symptoms get a lot worse.

## 2021-07-25 NOTE — Telephone Encounter (Signed)
Noted and agree. Provided recommendations are appropriate, whether she decides to accept the recommendations are up to her.  If she does not seek care this evening, then I recommend she follow-up with Dr. Einar Pheasant sometime this week.

## 2021-07-26 NOTE — Telephone Encounter (Signed)
Agree for need for office visit and advice given

## 2021-07-27 ENCOUNTER — Other Ambulatory Visit: Payer: Self-pay

## 2021-07-27 ENCOUNTER — Ambulatory Visit (INDEPENDENT_AMBULATORY_CARE_PROVIDER_SITE_OTHER): Payer: Medicare Other | Admitting: Family Medicine

## 2021-07-27 VITALS — BP 120/62 | HR 75 | Temp 98.1°F | Wt 134.1 lb

## 2021-07-27 DIAGNOSIS — R079 Chest pain, unspecified: Secondary | ICD-10-CM

## 2021-07-27 DIAGNOSIS — K52832 Lymphocytic colitis: Secondary | ICD-10-CM

## 2021-07-27 DIAGNOSIS — J0140 Acute pansinusitis, unspecified: Secondary | ICD-10-CM | POA: Insufficient documentation

## 2021-07-27 DIAGNOSIS — F4310 Post-traumatic stress disorder, unspecified: Secondary | ICD-10-CM | POA: Diagnosis not present

## 2021-07-27 MED ORDER — BUDESONIDE ER 9 MG PO TB24: 9.0000 mg | ORAL_TABLET | Freq: Every day | ORAL | 1 refills | Status: DC

## 2021-07-27 MED ORDER — PRAZOSIN HCL 1 MG PO CAPS: 1.0000 mg | ORAL_CAPSULE | Freq: Every day | ORAL | 0 refills | Status: DC

## 2021-07-27 MED ORDER — AMOXICILLIN-POT CLAVULANATE 875-125 MG PO TABS: 1.0000 | ORAL_TABLET | Freq: Two times a day (BID) | ORAL | 0 refills | Status: AC

## 2021-07-27 NOTE — Progress Notes (Signed)
Subjective:     Kim Pennington is a 66 y.o. female presenting for lymphocitic colitis (Diagnosed 8-10 years ago. Having flare up that's been going on since end of December but has gotten worse in last 2 weeks. Ulcers in and around mouth. ), Migraine, Dysphagia, and Referral (GI- with [facility] )     HPI  #Lymphocytic colitis - eyes, nose, mouth, digestive symptoms - started in December and worse in the last 2 weeks - diarrhea w/o blood - nausea - tried loperamide in the past w/o impact - avoiding triggers   #HA - severe symptoms behind the eyes - every day - very intense - treatment: xanax to help with sleep - tried tylenol w/o improvement - tries to avoid ibuprofen due to one kidney  Chest pain - can occur with exercise - will resolve with rest - deep pain - will also notice some skipped - heaviness - throat feels constricted  #PTSD/nightmares - seeing therapy - recommended prazosin - is interested in trying  Review of Systems   Social History   Tobacco Use  Smoking Status Never  Smokeless Tobacco Never        Objective:    BP Readings from Last 3 Encounters:  07/27/21 120/62  02/21/21 138/78  02/01/21 100/80   Wt Readings from Last 3 Encounters:  07/27/21 134 lb 2 oz (60.8 kg)  02/21/21 130 lb (59 kg)  02/01/21 136 lb 4 oz (61.8 kg)    BP 120/62    Pulse 75    Temp 98.1 F (36.7 C) (Oral)    Wt 134 lb 2 oz (60.8 kg)    SpO2 98%    BMI 23.76 kg/m    Physical Exam Physical Exam Constitutional:      General: She is not in acute distress.    Appearance: She is well-developed. She is not diaphoretic.  HENT:     Head: Normocephalic and atraumatic.     Right Ear: Tympanic membrane and ear canal normal.     Left Ear: Tympanic membrane and ear canal normal.     Nose: Mucosal edema and rhinorrhea present.     Right Sinus: Maxillary sinus tenderness present. No frontal sinus tenderness.     Left Sinus: Maxillary sinus tenderness present. No  frontal sinus tenderness.     Mouth/Throat:     Pharynx: Uvula midline. Posterior oropharyngeal erythema present. No oropharyngeal exudate.     Tonsils: 0 on the right. 0 on the left.  Eyes:     General: No scleral icterus.    Conjunctiva/sclera: Conjunctivae normal.  Cardiovascular:     Rate and Rhythm: Normal rate and regular rhythm.     Heart sounds: Normal heart sounds. No murmur heard. Pulmonary:     Effort: Pulmonary effort is normal. No respiratory distress.     Breath sounds: Normal breath sounds.  Musculoskeletal:     Cervical back: Neck supple.  Lymphadenopathy:     Cervical: No cervical adenopathy.  Skin:    General: Skin is warm and dry.     Capillary Refill: Capillary refill takes less than 2 seconds.  Neurological:     Mental Status: She is alert.          Assessment & Plan:   Problem List Items Addressed This Visit       Respiratory   Acute non-recurrent pansinusitis    Headache seems consistent with sinus related.  She is hesitant to take antibiotics due to history of C. difficile discussed  seeing how her headache responds to the budesonide and prescription for Augmentin sent in for patient to decide to take if she would would like.  Continue Tylenol as needed.      Relevant Medications   Budesonide ER 9 MG TB24   amoxicillin-clavulanate (AUGMENTIN) 875-125 MG tablet     Digestive   Lymphocytic colitis - Primary    Long history of colitis.  Patient with flare consisting of nausea, diarrhea, mouth sores.  Reports intermittent response to steroids discussed trial of budesonide.  Referral to GI locally for additional support.      Relevant Medications   Budesonide ER 9 MG TB24   Other Relevant Orders   Ambulatory referral to Gastroenterology     Other   Posttraumatic stress disorder    Currently in therapy.  Therapist recommended trial of prazosin this seems reasonable given her symptoms.  Start with 1 mg increase to 2 update if ineffective and can  discuss higher doses.  Discussed monitoring blood pressure and looking for lightheadedness and dizziness.      Relevant Medications   prazosin (MINIPRESS) 1 MG capsule   Chest pain    She continues to have some chest pain in the setting of just the flare of lymphoma colitis.  She has a cardiologist advised if chest pressure is not improving that she reach out to cardiology at this point with normal vital signs and chronicity did not feel repeat EKG was necessary in today's visit.         Return if symptoms worsen or fail to improve.  Kim Noe, MD  This visit occurred during the SARS-CoV-2 public health emergency.  Safety protocols were in place, including screening questions prior to the visit, additional usage of staff PPE, and extensive cleaning of exam room while observing appropriate contact time as indicated for disinfecting solutions.

## 2021-07-27 NOTE — Assessment & Plan Note (Signed)
Headache seems consistent with sinus related.  She is hesitant to take antibiotics due to history of C. difficile discussed seeing how her headache responds to the budesonide and prescription for Augmentin sent in for patient to decide to take if she would would like.  Continue Tylenol as needed.

## 2021-07-27 NOTE — Assessment & Plan Note (Signed)
Currently in therapy.  Therapist recommended trial of prazosin this seems reasonable given her symptoms.  Start with 1 mg increase to 2 update if ineffective and can discuss higher doses.  Discussed monitoring blood pressure and looking for lightheadedness and dizziness.

## 2021-07-27 NOTE — Assessment & Plan Note (Signed)
Long history of colitis.  Patient with flare consisting of nausea, diarrhea, mouth sores.  Reports intermittent response to steroids discussed trial of budesonide.  Referral to GI locally for additional support.

## 2021-07-27 NOTE — Patient Instructions (Addendum)
Lymphocytic Colitis - Start Budisonide 9 mg daily - take for up to 8 weeks  - referral to GI - Update if no improvement in 2 weeks   Night terrors - Start prazosin 1 mg  - after 3 days if no improvement increase to 2 mg - update if no change to discuss (goes up to 5 mg)   Call cardiology if not improved in 2 days  Update next week or Friday - and can change referral to urgent

## 2021-07-27 NOTE — Assessment & Plan Note (Signed)
She continues to have some chest pain in the setting of just the flare of lymphoma colitis.  She has a cardiologist advised if chest pressure is not improving that she reach out to cardiology at this point with normal vital signs and chronicity did not feel repeat EKG was necessary in today's visit.

## 2021-07-28 ENCOUNTER — Telehealth: Payer: Self-pay

## 2021-07-28 ENCOUNTER — Telehealth: Payer: Self-pay | Admitting: Family Medicine

## 2021-07-28 DIAGNOSIS — K52832 Lymphocytic colitis: Secondary | ICD-10-CM

## 2021-07-28 NOTE — Telephone Encounter (Signed)
[facility] ER 9mg  tab.  PA entered through covermymeds.  Told further information is needed. Awaiting more paperwork.

## 2021-07-28 NOTE — Telephone Encounter (Signed)
PA approved but pt states that it costs over $400 so she is calling her insurance co now.

## 2021-07-28 NOTE — Telephone Encounter (Signed)
[employer] called medication has been approved from 07/28/21-07/28/22

## 2021-07-28 NOTE — Telephone Encounter (Signed)
Pt had an appt yesterday she was told a steroid would be called in, she called the Pharmacy and they don't have it

## 2021-07-28 NOTE — Telephone Encounter (Signed)
Pharmacy received RX but it requires a PA. Waiting for pharmacy to fax over PA to my back fax.

## 2021-07-29 DIAGNOSIS — M170 Bilateral primary osteoarthritis of knee: Secondary | ICD-10-CM | POA: Diagnosis not present

## 2021-07-29 MED ORDER — BUDESONIDE ER 9 MG PO TB24: 9.0000 mg | ORAL_TABLET | Freq: Every day | ORAL | 1 refills | Status: DC

## 2021-07-29 NOTE — Telephone Encounter (Signed)
Pt wants this called into [facility] on [address] because it will be only $90

## 2021-07-29 NOTE — Addendum Note (Signed)
Addended by: Loreen Freud on: 07/29/2021 09:46 AM   Modules accepted: Orders

## 2021-07-29 NOTE — Telephone Encounter (Signed)
Rx sent to [facility] as requested.  

## 2021-08-01 NOTE — Telephone Encounter (Signed)
Please call patient to check in.   Not sure if she has mychart.   Please relay unread message from GI  "If you will give our office a call at your convenience to discuss scheduling at [phone] option 1   Thank you, [facility]"   Has she been able to get the medication covered?   If not here are the options 1) wait for GI 2) Try prednisone - 2nd line option but may help

## 2021-08-02 ENCOUNTER — Other Ambulatory Visit: Payer: Self-pay | Admitting: Family Medicine

## 2021-08-02 ENCOUNTER — Encounter: Payer: Self-pay | Admitting: Gastroenterology

## 2021-08-02 DIAGNOSIS — K52832 Lymphocytic colitis: Secondary | ICD-10-CM

## 2021-08-02 NOTE — Telephone Encounter (Signed)
Spoke to pt and relayed message from GI. Pt is going to call today. Also, pt is going to pick [facility] today.

## 2021-08-03 ENCOUNTER — Other Ambulatory Visit: Payer: Self-pay | Admitting: Family Medicine

## 2021-08-03 ENCOUNTER — Telehealth: Payer: Self-pay | Admitting: Family Medicine

## 2021-08-03 DIAGNOSIS — F5104 Psychophysiologic insomnia: Secondary | ICD-10-CM

## 2021-08-03 DIAGNOSIS — K52832 Lymphocytic colitis: Secondary | ICD-10-CM

## 2021-08-03 NOTE — Telephone Encounter (Signed)
error 

## 2021-08-03 NOTE — Telephone Encounter (Signed)
?  Encourage patient to contact the pharmacy for refills or they can request refills through [facility] ? ?LAST APPOINTMENT DATE:  Please schedule appointment if longer than 1 year ? ?NEXT APPOINTMENT DATE: ? ?MEDICATION:Budesonide ER 9 MG TB24,ALPRAZolam (XANAX) 1 MG tablet ? ?Is the patient out of medication?  ? ?PHARMACY:[id number] - [place], [place]( Pt would like all of her medication going here) ? ?Let patient know to contact pharmacy at the end of the day to make sure medication is ready. ? ?Please notify patient to allow 48-72 hours to process ? ?CLINICAL FILLS OUT ALL BELOW:  ? ?LAST REFILL: ? ?QTY: ? ?REFILL DATE: ? ? ? ?OTHER COMMENTS:  ? ? ?Okay for refill? ? ?Please advise ? ? ?  ?

## 2021-08-04 MED ORDER — ALPRAZOLAM 1 MG PO TABS: 1.0000 mg | ORAL_TABLET | Freq: Every evening | ORAL | 1 refills | Status: DC | PRN

## 2021-08-04 MED ORDER — BUDESONIDE ER 9 MG PO TB24: 9.0000 mg | ORAL_TABLET | Freq: Every day | ORAL | 1 refills | Status: DC

## 2021-08-04 NOTE — Telephone Encounter (Signed)
Pt never picked up budesonide from [facility] so needs it sent to [facility].  ? ?Last fill of Xanax: 08/10/20 #90 with 1 ?

## 2021-08-05 DIAGNOSIS — M25511 Pain in right shoulder: Secondary | ICD-10-CM | POA: Diagnosis not present

## 2021-08-05 DIAGNOSIS — M25512 Pain in left shoulder: Secondary | ICD-10-CM | POA: Diagnosis not present

## 2021-08-06 ENCOUNTER — Other Ambulatory Visit: Payer: Self-pay | Admitting: Family Medicine

## 2021-08-06 DIAGNOSIS — K52832 Lymphocytic colitis: Secondary | ICD-10-CM

## 2021-08-08 NOTE — Telephone Encounter (Signed)
Refill sent ? ?Patient is only supposed to take for 8 weeks, 90-day supply not recommended at this time. ?

## 2021-08-08 NOTE — Telephone Encounter (Signed)
RX was sent in on 08/04/21 for 30 day supply, pharmacy stating patient request 90 day supply instead. ?

## 2021-08-08 NOTE — Telephone Encounter (Signed)
Pt called stating [facility] is charging over $400 for the budesonide, also. Headaches and GI tract are getting worse. The regular strength is available at [facility] with GoodRx card for less than $40.  ? ? ?

## 2021-08-11 ENCOUNTER — Ambulatory Visit: Payer: Medicare Other | Admitting: Podiatry

## 2021-08-16 ENCOUNTER — Telehealth: Payer: Self-pay | Admitting: Family Medicine

## 2021-08-16 ENCOUNTER — Other Ambulatory Visit: Payer: Self-pay

## 2021-08-16 ENCOUNTER — Ambulatory Visit: Payer: Medicare Other | Admitting: Podiatry

## 2021-08-16 DIAGNOSIS — M19072 Primary osteoarthritis, left ankle and foot: Secondary | ICD-10-CM | POA: Diagnosis not present

## 2021-08-16 DIAGNOSIS — M7751 Other enthesopathy of right foot: Secondary | ICD-10-CM

## 2021-08-16 DIAGNOSIS — K52832 Lymphocytic colitis: Secondary | ICD-10-CM

## 2021-08-16 DIAGNOSIS — M19071 Primary osteoarthritis, right ankle and foot: Secondary | ICD-10-CM

## 2021-08-16 DIAGNOSIS — M7752 Other enthesopathy of left foot: Secondary | ICD-10-CM

## 2021-08-16 NOTE — Telephone Encounter (Signed)
Pt called stating medication Budesonide ER 9 MG TB24 is to expensive. Pt states that she needs medication in Tier 2 or Tier 3 for it to be less expensive. Please advise.  ?

## 2021-08-16 NOTE — Telephone Encounter (Signed)
Routing to pharmacy to see if she has any other suggestions. ? ?We can also try prednisone which will likely be less expensive.  But ideally would be able to find something a little bit better for GI. ?

## 2021-08-17 ENCOUNTER — Ambulatory Visit (INDEPENDENT_AMBULATORY_CARE_PROVIDER_SITE_OTHER): Payer: Medicare Other

## 2021-08-17 DIAGNOSIS — R55 Syncope and collapse: Secondary | ICD-10-CM

## 2021-08-17 LAB — CUP PACEART REMOTE DEVICE CHECK
Battery Remaining Longevity: 50 mo
Battery Remaining Percentage: 41 %
Battery Voltage: 2.96 V
Brady Statistic AP VP Percent: 1 %
Brady Statistic AP VS Percent: 16 %
Brady Statistic AS VP Percent: 1 %
Brady Statistic AS VS Percent: 84 %
Brady Statistic RA Percent Paced: 16 %
Brady Statistic RV Percent Paced: 1 %
Date Time Interrogation Session: 20230315020014
Implantable Lead Implant Date: 20151120
Implantable Lead Implant Date: 20151120
Implantable Lead Location: 753862
Implantable Lead Location: 753862
Implantable Pulse Generator Implant Date: 20151120
Lead Channel Impedance Value: 400 Ohm
Lead Channel Impedance Value: 610 Ohm
Lead Channel Pacing Threshold Amplitude: 0.75 V
Lead Channel Pacing Threshold Amplitude: 0.75 V
Lead Channel Pacing Threshold Pulse Width: 0.5 ms
Lead Channel Pacing Threshold Pulse Width: 0.5 ms
Lead Channel Sensing Intrinsic Amplitude: 1.1 mV
Lead Channel Sensing Intrinsic Amplitude: 8.2 mV
Lead Channel Setting Pacing Amplitude: 2 V
Lead Channel Setting Pacing Amplitude: 2 V
Lead Channel Setting Pacing Pulse Width: 0.5 ms
Lead Channel Setting Sensing Sensitivity: 0.5 mV
Pulse Gen Model: 2240
Pulse Gen Serial Number: 7689719

## 2021-08-18 ENCOUNTER — Other Ambulatory Visit: Payer: Self-pay | Admitting: Family Medicine

## 2021-08-18 NOTE — Telephone Encounter (Addendum)
It would be reasonable to try alternative steroids (prednisone, dexamethasone, methylprednisolone) - all are preferred with her insurance. Prednisone at 40 mg/day is the typical starting dose for induction of remission in UC/CD, usually treated for 2-4 weeks then tapered. ? ?Mesalamine is also preferred by her insurance, typically this is used for maintenance of remission in UC/CD so I'm not sure it would appropriate here. ? ?Hopefully she can get in with GI soon to help classify colitis, she may be a candidate for UC/CD treatments (mesalamine or monoclonal antibodies) ? ? ?

## 2021-08-19 MED ORDER — PREDNISONE 20 MG PO TABS: ORAL_TABLET | ORAL | 0 refills | Status: AC

## 2021-08-19 NOTE — Addendum Note (Signed)
Addended by: Waunita Schooner R on: 08/19/2021 08:00 AM ? ? Modules accepted: Orders ? ?

## 2021-08-19 NOTE — Telephone Encounter (Signed)
Please call patient to advise that I have sent in prednisone.  It looks like she has GI follow-up in about a week, there is no clear guidelines for prednisone for treatment of lymphocytic colitis.  So I have started at a slightly high dose with a drop to a lower dose due to the risk for side effects.  However if her symptoms are persisting she can let me know if she goes down to the lower dose and has worsening symptoms we can do a higher dose for a longer period of time. ? ?Or depending on how her symptoms are she could also wait for her GI appointment which is unfortunate since the notes taken a while for her to get in. ?

## 2021-08-19 NOTE — Telephone Encounter (Signed)
I would recommend a follow-up visit to discuss migraines.  ? ?Kim Pennington is a second line medication and needs an office visit to discuss options and risks fully.  ? ?Please clarify if she is taking prazosin 1 mg or 2 mg.  ? ?[name] ? ?

## 2021-08-22 ENCOUNTER — Telehealth: Payer: Self-pay | Admitting: Family Medicine

## 2021-08-22 ENCOUNTER — Other Ambulatory Visit: Payer: Self-pay | Admitting: Family Medicine

## 2021-08-22 ENCOUNTER — Other Ambulatory Visit: Payer: Self-pay

## 2021-08-22 DIAGNOSIS — F4310 Post-traumatic stress disorder, unspecified: Secondary | ICD-10-CM

## 2021-08-22 MED ORDER — PRAZOSIN HCL 1 MG PO CAPS: 1.0000 mg | ORAL_CAPSULE | Freq: Every day | ORAL | 1 refills | Status: DC

## 2021-08-22 NOTE — Telephone Encounter (Signed)
Mychart message sent to pt, recommending a f/u visit.  ?

## 2021-08-22 NOTE — Telephone Encounter (Signed)
Noted  

## 2021-08-22 NOTE — Telephone Encounter (Signed)
Pt called stating that she would like all her medication going to [id number] - [place], [place]. Pt also states if [facility]. Please advise. ?

## 2021-08-23 ENCOUNTER — Other Ambulatory Visit: Payer: Self-pay

## 2021-08-23 ENCOUNTER — Encounter: Payer: Self-pay | Admitting: Podiatry

## 2021-08-23 DIAGNOSIS — F5104 Psychophysiologic insomnia: Secondary | ICD-10-CM

## 2021-08-23 MED ORDER — MONTELUKAST SODIUM 10 MG PO TABS: 10.0000 mg | ORAL_TABLET | ORAL | 0 refills | Status: DC | PRN

## 2021-08-23 MED ORDER — ATORVASTATIN CALCIUM 80 MG PO TABS: 80.0000 mg | ORAL_TABLET | Freq: Every day | ORAL | 0 refills | Status: DC

## 2021-08-23 MED ORDER — AMLODIPINE BESYLATE 10 MG PO TABS: 10.0000 mg | ORAL_TABLET | Freq: Every day | ORAL | 0 refills | Status: DC

## 2021-08-23 MED ORDER — LOSARTAN POTASSIUM 25 MG PO TABS: 25.0000 mg | ORAL_TABLET | Freq: Every day | ORAL | 0 refills | Status: DC

## 2021-08-23 MED ORDER — ESTRADIOL 0.5 MG PO TABS: 0.5000 mg | ORAL_TABLET | Freq: Every day | ORAL | 0 refills | Status: DC

## 2021-08-23 MED ORDER — ALENDRONATE SODIUM 70 MG PO TABS: 70.0000 mg | ORAL_TABLET | ORAL | 0 refills | Status: DC

## 2021-08-23 NOTE — Progress Notes (Signed)
?Subjective:  ?Patient ID: Kim Pennington, female    DOB: [birth year],  MRN: [id number] ? ?Chief Complaint  ?Patient presents with  ? Foot Pain  ?  MRI results   ? ? ?66 y.o. female presents with the above complaint.  Patient presents with bilateral foot pain from the surgery that she had done back in [place].  She states that she had finally completed the MRI.  She is here to go over the MRI.  She was able to do it at [facility].  She states her pain is more manageable another few months have passed by.  She just wants to discuss all of her options. ? ?Review of Systems: Negative except as noted in the HPI. Denies N/V/F/Ch. ? ?Past Medical History:  ?Diagnosis Date  ? Acquired solitary kidney   ? right nephrectomy  ? Depression   ? Headache   ? Hypertension   ? Kidney disease, chronic, stage III (GFR 30-59 ml/min) (HCC)   ? Memory loss   ? Pacemaker   ? Renal failure, chronic, stage 3 (moderate) ([place]) 2015  ? Left kidney   ? ? ?Current Outpatient Medications:  ?  alendronate (FOSAMAX) 70 MG tablet, TAKE 1 TABLET (70 MG TOTAL) BY MOUTH ONCE A WEEK., Disp: 12 tablet, Rfl: 0 ?  ALPRAZolam (XANAX) 1 MG tablet, Take 1 tablet (1 mg total) by mouth at bedtime as needed., Disp: 90 tablet, Rfl: 1 ?  amLODipine (NORVASC) 10 MG tablet, TAKE 1 TABLET BY MOUTH EVERY DAY, Disp: 90 tablet, Rfl: 1 ?  atorvastatin (LIPITOR) 80 MG tablet, Take 1 tablet (80 mg total) by mouth daily., Disp: 90 tablet, Rfl: 3 ?  Calcium Carbonate-Vit D-Min (CALCIUM 1200 PO), Take 1 tablet by mouth daily., Disp: , Rfl:  ?  CRANBERRY-CALCIUM PO, Take 1 tablet by mouth daily., Disp: , Rfl:  ?  estradiol (ESTRACE) 0.5 MG tablet, TAKE 1 TABLET BY MOUTH EVERY DAY, Disp: 90 tablet, Rfl: 0 ?  losartan (COZAAR) 25 MG tablet, Take 1 tablet (25 mg total) by mouth daily., Disp: 90 tablet, Rfl: 3 ?  montelukast (SINGULAIR) 10 MG tablet, TAKE 1 TABLET BY MOUTH AS NEEDED., Disp: 90 tablet, Rfl: 0 ?  prazosin (MINIPRESS) 1 MG capsule, Take 1-2 capsules (1-2 mg  total) by mouth at bedtime., Disp: 90 capsule, Rfl: 1 ?  predniSONE (DELTASONE) 20 MG tablet, Take 2 tablets (40 mg total) by mouth daily with breakfast for 3 days, THEN 1 tablet (20 mg total) daily with breakfast for 5 days, THEN 0.5 tablets (10 mg total) daily with breakfast for 5 days., Disp: 13.5 tablet, Rfl: 0 ?  vitamin B-12 (CYANOCOBALAMIN) 1000 MCG tablet, Take 1 tablet (1,000 mcg total) by mouth daily., Disp: 90 tablet, Rfl: 3 ?  zolpidem (AMBIEN) 5 MG tablet, TAKE 1/2 TABLETS (2.5 MG TOTAL) BY MOUTH AT BEDTIME., Disp: 15 tablet, Rfl: 0 ? ?Social History  ? ?Tobacco Use  ?Smoking Status Never  ?Smokeless Tobacco Never  ? ? ?Allergies  ?Allergen Reactions  ? Tape Hives  ?  Tape adhesive long period of time   ? ?Objective:  ?There were no vitals filed for this visit. ?There is no height or weight on file to calculate BMI. ?Constitutional Well developed. ?Well nourished.  ?Vascular Dorsalis pedis pulses palpable bilaterally. ?Posterior tibial pulses palpable bilaterally. ?Capillary refill normal to all digits.  ?No cyanosis or clubbing noted. ?Pedal hair growth normal.  ?Neurologic Normal speech. ?Oriented to person, place, and time. ?Epicritic sensation to light  touch grossly present bilaterally.  ?Dermatologic Nails well groomed and normal in appearance. ?No open wounds. ?No skin lesions.  ?Orthopedic:  Bilateral second interspace neuroma versus capsulitis.  Positive Mulder's click.  Positive [place] sign left greater than right side.  Previous scarring/surgical scarring noted.  Pain on palpation to the left first tarsometatarsal joint.  osteoarthritic changes were clinically palpated.  No pain with resisted dorsiflexion and plantarflexion of the digit ruling out tendinitis  ? ?Radiographs: Bilateral second interspace: 3 views of skeletally mature adult bilateral foot: Previous hardware noted in the metatarsal second and third.  No sign of loosening or breaking noted.  There appear to be snap off screws.   Positive [place] sign noted on x-ray.  No elevatus noted.  Hardware appears intact ? ? ?MRI results in the media tab of the chart.  Please refer to that. ? ? ?Assessment:  ? ?1. Capsulitis of metatarsophalangeal (MTP) joint of left foot   ?2. Capsulitis of metatarsophalangeal (MTP) joint of right foot   ?3. Osteoarthritis of first metatarsophalangeal (MTP) joint of both feet   ? ? ? ?Plan:  ?Patient was evaluated and treated and all questions answered. ? ?Left first tarsometatarsal joint capsulitis ?-Clinically improving. ? ?Bilateral second interspace neuroma/capsulitis left greater than right ?-I explained to patient the etiology of neuroma and capsulitis and various treatment options were discussed.  Given that patient has previous surgery done to both of the feet with associated scarring in the interspace of second I believe there is likely a lot of scar tissue/vibratory tissue that could be causing her pain.  I believe patient may benefit from a steroid injection help decrease the pain associated with inflammation.  Patient agrees with the plan would like to proceed with injection. ?- another steroid injection was performed at bilateral second MPJ using 1% plain Lidocaine and 10 mg of Kenalog. This was well tolerated. ? ?-MRI was reviewed with the patient in extensive detail which showed primarily sesamoiditis with arthritic changes to the first metatarsophalangeal joint ?-There is also degenerative changes to the second metatarsophalangeal joint moderate in amount. ?No follow-ups on file.  ?

## 2021-08-23 NOTE — Telephone Encounter (Signed)
Pt requests all Rx's get sent to [facility] now.  ? ?Ambien: Last refill: 06/24/21 #15 with 0 ?Last OV: 07/27/21 ?Next OV: 11/28/21 for CPE ?

## 2021-08-24 ENCOUNTER — Other Ambulatory Visit: Payer: Self-pay

## 2021-08-24 ENCOUNTER — Ambulatory Visit (INDEPENDENT_AMBULATORY_CARE_PROVIDER_SITE_OTHER): Payer: Medicare Other | Admitting: Cardiology

## 2021-08-24 VITALS — BP 132/72 | HR 83 | Ht 62.0 in | Wt 135.0 lb

## 2021-08-24 DIAGNOSIS — Z950 Presence of cardiac pacemaker: Secondary | ICD-10-CM

## 2021-08-24 DIAGNOSIS — R55 Syncope and collapse: Secondary | ICD-10-CM

## 2021-08-24 LAB — PACEMAKER DEVICE OBSERVATION

## 2021-08-24 MED ORDER — ALPRAZOLAM 1 MG PO TABS: 1.0000 mg | ORAL_TABLET | Freq: Every evening | ORAL | 2 refills | Status: DC | PRN

## 2021-08-24 MED ORDER — ZOLPIDEM TARTRATE 5 MG PO TABS: ORAL_TABLET | ORAL | 2 refills | Status: DC

## 2021-08-24 NOTE — Progress Notes (Signed)
?Electrophysiology Office Follow up Visit Note:   ? ?Date:  08/24/2021  ? ?ID:  Kim Pennington, DOB 1956/01/21, MRN [id number] ? ?PCP:  [name], MD  ?[facility] Cardiologist:  None  ?[facility] Electrophysiologist:  Vickie Epley, MD  ? ? ?Interval History:   ? ?Kim Pennington is a 66 y.o. female who presents for a follow up visit. They were last seen in clinic May 19, 2020.  She is with her husband today in clinic who I also see.  She has been doing well.  No new complaints.  Pacemaker is functioning well. ? ? ? ?  ? ?Past Medical History:  ?Diagnosis Date  ? Acquired solitary kidney   ? right nephrectomy  ? Depression   ? Headache   ? Hypertension   ? Kidney disease, chronic, stage III (GFR 30-59 ml/min) (HCC)   ? Memory loss   ? Pacemaker   ? Renal failure, chronic, stage 3 (moderate) ([place]) 2015  ? Left kidney   ? ? ?Past Surgical History:  ?Procedure Laterality Date  ? ABDOMINAL HYSTERECTOMY    ? ABDOMINOPLASTY    ? BILATERAL CARPAL TUNNEL RELEASE    ? BREAST SURGERY    ? CARPAL TUNNEL RELEASE Bilateral   ? CESAREAN SECTION    ? x4  ? FOOT SURGERY    ? HERNIA REPAIR    ? HIP SURGERY    ? reduction of lesser trochanter   ? LAPAROSCOPIC BILATERAL SALPINGO OOPHERECTOMY    ? left thumb tendon    ? LIPOSUCTION    ? NEPHRECTOMY Right   ? NEPHROSTOMY    ? PACEMAKER IMPLANT    ? RHINOPLASTY    ? ROTATOR CUFF REPAIR    ? TONSILECTOMY/ADENOIDECTOMY WITH MYRINGOTOMY    ? ? ?Current Medications: ?Current Meds  ?Medication Sig  ? alendronate (FOSAMAX) 70 MG tablet Take 1 tablet (70 mg total) by mouth once a week.  ? ALPRAZolam (XANAX) 1 MG tablet Take 1 tablet (1 mg total) by mouth at bedtime as needed.  ? amLODipine (NORVASC) 10 MG tablet Take 1 tablet (10 mg total) by mouth daily.  ? atorvastatin (LIPITOR) 80 MG tablet Take 1 tablet (80 mg total) by mouth daily.  ? Calcium Carbonate-Vit D-Min (CALCIUM 1200 PO) Take 1 tablet by mouth daily.  ? CRANBERRY-CALCIUM PO Take 1 tablet by mouth daily.  ?  estradiol (ESTRACE) 0.5 MG tablet Take 1 tablet (0.5 mg total) by mouth daily.  ? losartan (COZAAR) 25 MG tablet Take 1 tablet (25 mg total) by mouth daily.  ? montelukast (SINGULAIR) 10 MG tablet Take 1 tablet (10 mg total) by mouth as needed.  ? prazosin (MINIPRESS) 1 MG capsule Take 1-2 capsules (1-2 mg total) by mouth at bedtime.  ? predniSONE (DELTASONE) 20 MG tablet Take 2 tablets (40 mg total) by mouth daily with breakfast for 3 days, THEN 1 tablet (20 mg total) daily with breakfast for 5 days, THEN 0.5 tablets (10 mg total) daily with breakfast for 5 days.  ? vitamin B-12 (CYANOCOBALAMIN) 1000 MCG tablet Take 1 tablet (1,000 mcg total) by mouth daily.  ? zolpidem (AMBIEN) 5 MG tablet TAKE 1/2 TABLETS (2.5 MG TOTAL) BY MOUTH AT BEDTIME.  ?  ? ?Allergies:   Tape  ? ?Social History  ? ?Socioeconomic History  ? Marital status: Married  ?  Spouse name: Kim Pennington   ? Number of children: 4  ? Years of education: some college  ? Highest education level: Not  on file  ?Occupational History  ? Not on file  ?Tobacco Use  ? Smoking status: Never  ? Smokeless tobacco: Never  ?Vaping Use  ? Vaping Use: Never used  ?Substance and Sexual Activity  ? Alcohol use: Never  ? Drug use: Never  ? Sexual activity: Not Currently  ?  Birth control/protection: Post-menopausal, Surgical  ?Other Topics Concern  ? Not on file  ?Social History Narrative  ? 06/29/20  ? From: [place], lived in [place] some, all her children live in [place] 2021  ? Living: with husband, Kim Pennington [phone])  ? 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   ? Family: 4 adult children - Alroy Dust, Jessica, Sariah and Maryruth Hancock - 16 grandchildren (has 4 adult step children)  ?   ? Enjoys: work out, spend time with family, hand work, music  ?   ? Exercise: foot pain limits - typically weight lifts 2 hours a day  ? Diet: good, not as good since christmas  ?   ? Safety  ? Seat belts: Yes   ? Guns: Yes  and secure  ? Safe in relationships: Yes   ? ?Social  Determinants of Health  ? ?Financial Resource Strain: Not on file  ?Food Insecurity: Not on file  ?Transportation Needs: Not on file  ?Physical Activity: Not on file  ?Stress: Not on file  ?Social Connections: Not on file  ?  ? ?Family History: ?The patient's family history includes Alzheimer's disease in her father and mother; Breast cancer (age of onset: 82) in her sister; CVA in her father; Diabetes in her mother and sister; Heart disease in her father; Hyperlipidemia in her brother, brother, sister, sister, sister, and sister; Hypertension in her brother, brother, father, sister, sister, sister, and sister; Parkinson's disease in her mother. ? ?ROS:   ?Please see the history of present illness.    ?All other systems reviewed and are negative. ? ?EKGs/Labs/Other Studies Reviewed:   ? ?The following studies were reviewed today: ? ?August 24, 2021 in clinic device interrogation personally reviewed ?Battery longevity 4 years ?Lead parameter stable ?DDD 60-1 20 ?Atrially pacing 15% ?Ventricular pacing less than 1% ? ?EKG:  The ekg ordered today demonstrates sinus rhythm ? ?Recent Labs: ?11/08/2020: ALT 36; BUN 30; Creatinine, Ser 1.15; Hemoglobin 14.1; Platelets 237.0; Potassium 5.0; Sodium 137  ?Recent Lipid Panel ?   ?Component Value Date/Time  ? CHOL 161 11/08/2020 1444  ? TRIG 55.0 11/08/2020 1444  ? HDL 88.50 11/08/2020 1444  ? CHOLHDL 2 11/08/2020 1444  ? VLDL 11.0 11/08/2020 1444  ? [place] 61 11/08/2020 1444  ? ? ?Physical Exam:   ? ?VS:  BP 132/72 (BP Location: Left Arm, Patient Position: Sitting, Cuff Size: Normal)   Pulse 83   Ht '5\' 2"'$  (1.575 m)   Wt 135 lb (61.2 kg)   SpO2 96%   BMI 24.69 kg/m?    ? ?Wt Readings from Last 3 Encounters:  ?08/24/21 135 lb (61.2 kg)  ?07/27/21 134 lb 2 oz (60.8 kg)  ?02/21/21 130 lb (59 kg)  ?  ? ?GEN:  Well nourished, well developed in no acute distress ?HEENT: Normal ?NECK: No JVD; No carotid bruits ?LYMPHATICS: No lymphadenopathy ?CARDIAC: RRR, no murmurs, rubs,  gallops.  Pacemaker pocket well-healed ?RESPIRATORY:  Clear to auscultation without rales, wheezing or rhonchi  ?ABDOMEN: Soft, non-tender, non-distended ?MUSCULOSKELETAL:  No edema; No deformity  ?SKIN: Warm and dry ?NEUROLOGIC:  Alert and oriented x 3 ?PSYCHIATRIC:  Normal affect  ? ? ? ?  ? ?ASSESSMENT:   ? ?1. Syncope and collapse   ?2. Cardiac pacemaker in situ   ? ?PLAN:   ? ?In order of problems listed above: ? ? ? ?#Syncope ?#Pacemaker in situ ? ?Device functioning appropriately.  Continue remote monitoring.   ? ?Follow-up in 1 year or sooner as needed. ? ? ?Medication Adjustments/Labs and Tests Ordered: ?Current medicines are reviewed at length with the patient today.  Concerns regarding medicines are outlined above.  ?Orders Placed This Encounter  ?Procedures  ? EKG 12-Lead  ? ?No orders of the defined types were placed in this encounter. ? ? ? ?Signed, ?Lars Mage, MD, [facility], [facility] ?08/24/2021 9:22 PM    ?Electrophysiology ?[place] ?

## 2021-08-24 NOTE — Patient Instructions (Signed)
Medication Instructions:  ?- Your physician recommends that you continue on your current medications as directed. Please refer to the Current Medication list given to you today. ? ?*If you need a refill on your cardiac medications before your next appointment, please call your pharmacy* ? ? ?Lab Work: ?- none ordered ? ?If you have labs (blood work) drawn today and your tests are completely normal, you will receive your results only by: ?MyChart Message (if you have MyChart) OR ?A paper copy in the mail ?If you have any lab test that is abnormal or we need to change your treatment, we will call you to review the results. ? ? ?Testing/Procedures: ?- none ordered ? ? ?Follow-Up: ?At [facility], you and your health needs are our priority.  As part of our continuing mission to provide you with exceptional heart care, we have created designated Provider Care Teams.  These Care Teams include your primary Cardiologist (physician) and Advanced Practice Providers (APPs -  Physician Assistants and Nurse Practitioners) who all work together to provide you with the care you need, when you need it. ? ?We recommend signing up for the patient portal called "MyChart".  Sign up information is provided on this After Visit Summary.  MyChart is used to connect with patients for Virtual Visits (Telemedicine).  Patients are able to view lab/test results, encounter notes, upcoming appointments, etc.  Non-urgent messages can be sent to your provider as well.   ?To learn more about what you can do with MyChart, go to [email or web address].   ? ?Your next appointment:   ?1 year(s) ? ?The format for your next appointment:   ?In Person ? ?Provider:   ?Lars Mage, MD  ? ? ?Other Instructions ?N/a ? ?

## 2021-08-24 NOTE — Telephone Encounter (Signed)
Refills provided for 3 months.  ? ?Please have pt schedule f/u visit for insomnia prior to additional refills ?

## 2021-08-26 ENCOUNTER — Ambulatory Visit: Payer: Medicare Other | Admitting: Gastroenterology

## 2021-08-26 ENCOUNTER — Other Ambulatory Visit: Payer: Medicare Other

## 2021-08-26 ENCOUNTER — Encounter: Payer: Self-pay | Admitting: Gastroenterology

## 2021-08-26 VITALS — BP 132/78 | HR 94 | Ht 62.0 in | Wt 137.6 lb

## 2021-08-26 DIAGNOSIS — K529 Noninfective gastroenteritis and colitis, unspecified: Secondary | ICD-10-CM | POA: Diagnosis not present

## 2021-08-26 NOTE — Progress Notes (Signed)
? ? ?[facility] Consult Note: ? ?History: ?Kim Pennington ?08/26/2021 ? ?Referring provider: [name], MD ? ?Reason for consult/chief complaint: lymphocytic colitis (Inflamed from mouth to rectum, hx of c-diff), Abdominal Cramping, Constipation, and Diarrhea ? ? ?Subjective  ?HPI: ? ?Kim Pennington saw me today as a new patient, referred by primary care for management of abdominal pain and history of lymphocytic colitis. ?She describes a prolonged illness with C. difficile in 2015 while living in [place].  It was treated and finally cleared, but she had persistent diarrhea afterward, leading to a colonoscopy that reportedly discovered lymphocytic colitis.  She was put on budesonide with significant improvement, and took it for a few months before being able to stop it.  Ever since then she would get "flares" characterized by generalized abdominal pain, and a feeling of inflammation from the mouth through her entire digestive tract.  She would have multiple loose nonbloody stools in a day with generalized abdominal pain, then no BM for several days.  Her previous GI physician would give her treatment courses of budesonide that would lead to significant improvement, though she would continue to have low-grade abdominal pain and altered bowel habits. ? ? ?She saw primary care February 22 having a "flare" of symptoms since December.  Loose stools, nausea, sores in the mouth. ?She was put on budesonide ER (uceris) 9 mg once daily, though she discovered it was over $400 and did not fill that prescription.  She has not been taking Imodium out of concern it may be masking this problem, and she would like to know the status of it to decide what to do with medicines. ?Taisley cannot recall if she has been tested for celiac disease in the past, and is not aware of any family history of that.  No known family history of colorectal cancer. ? ?ROS: ? ?Review of Systems  ?Constitutional:  Negative for appetite  change and unexpected weight change.  ?HENT:  Negative for mouth sores and voice change.   ?Eyes:  Negative for pain and redness.  ?Respiratory:  Negative for cough and shortness of breath.   ?Cardiovascular:  Negative for chest pain and palpitations.  ?Genitourinary:  Negative for dysuria and hematuria.  ?Musculoskeletal:  Negative for arthralgias and myalgias.  ?Skin:  Negative for pallor and rash.  ?Neurological:  Negative for weakness and headaches.  ?Hematological:  Negative for adenopathy.  ?Psychiatric/Behavioral:    ?     Self-described "OCD"  ? ? ?Past Medical History: ?Past Medical History:  ?Diagnosis Date  ? Acquired solitary kidney   ? right nephrectomy  ? Anemia   ? Anxiety   ? Arthritis   ? C. difficile diarrhea   ? Depression   ? Headache   ? Hepatitis B   ? Hyperlipidemia   ? Hypertension   ? Kidney disease, chronic, stage III (GFR 30-59 ml/min) (HCC)   ? Kidney stones   ? Lymphocytic colitis   ? Memory loss   ? Pacemaker   ? Renal failure, chronic, stage 3 (moderate) ([place]) 2015  ? Left kidney   ? Skin cancer   ? ? ? ?Past Surgical History: ?Past Surgical History:  ?Procedure Laterality Date  ? ABDOMINAL HYSTERECTOMY  11/2006  ? ABDOMINOPLASTY    ? BREAST LUMPECTOMY Right 1979  ? CARDIAC CATHETERIZATION  04/24/2014  ? CARPAL TUNNEL RELEASE Bilateral 03/29/2009  ? CESAREAN SECTION    ? x4  ? FOOT SURGERY    ? HIP ARTHROSCOPY Right 09/14/2016  ?  reduction of lesser trochanter   ? HIP ARTHROSCOPY Left 04/25/2016  ? w/ femoroplasty  ? left thumb tendon  04/25/2016  ? LIPOSUCTION    ? LUMBAR LAMINECTOMY  11/23/2019  ? foraminotomy, discectomy, facectomy  ? NEPHRECTOMY Right 2009  ? PACEMAKER IMPLANT  04/24/2014  ? RHINOPLASTY    ? x 2, 1999, 2003  ? ROTATOR CUFF REPAIR Right   ? SHOULDER ARTHROSCOPY Right 12/30/2018  ? TONSILLECTOMY  1961  ? [place]  ? WEIL OSTEOTOMY Bilateral 11/27/2019  ? ? ? ?Family History: ?Family History  ?Problem Relation Age of Onset  ? Diabetes Mother   ?  Parkinson's disease Mother   ? Alzheimer's disease Mother   ? Heart disease Father   ? Alzheimer's disease Father   ? Hypertension Father   ? CVA Father   ? Rheum arthritis Father   ? Hyperlipidemia Sister   ? Hypertension Sister   ? Diabetes Sister   ? Liver disease Sister   ? Drug abuse Sister   ? Obesity Sister   ? Hyperlipidemia Sister   ? Hypertension Sister   ? Diabetes Sister   ? Heart disease Sister   ? Obesity Sister   ? Diabetes Sister   ? Breast cancer Sister 27  ?     x2  ? Hyperlipidemia Sister   ? Hypertension Sister   ? Colon polyps Sister   ? Obesity Sister   ? Hyperlipidemia Sister   ? Hypertension Sister   ? Obesity Sister   ? Hyperlipidemia Brother   ? Hypertension Brother   ? Heart disease Brother   ? Obesity Brother   ? Diabetes Maternal Grandmother   ? Heart attack Paternal Grandfather   ? Diabetes Maternal Aunt   ? Obesity Son   ? Osteoarthritis Daughter   ? ? ?Social History: ?Social History  ? ?Socioeconomic History  ? Marital status: Married  ?  Spouse name: Legrand Como   ? Number of children: 4  ? Years of education: some college  ? Highest education level: Not on file  ?Occupational History  ? Occupation: retired Event organiser  ?Tobacco Use  ? Smoking status: Never  ? Smokeless tobacco: Never  ?Vaping Use  ? Vaping Use: Never used  ?Substance and Sexual Activity  ? Alcohol use: Never  ? Drug use: Never  ? Sexual activity: Not Currently  ?  Birth control/protection: Post-menopausal, Surgical  ?Other Topics Concern  ? Not on file  ?Social History Narrative  ? 06/29/20  ? From: [place], lived in [place] some, all her children live in [place] 2021  ? Living: with husband, Legrand Como [phone])  ? 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   ? Family: 4 adult children - Alroy Dust, Jessica, Sariah and Maryruth Hancock - 16 grandchildren (has 4 adult step children)  ?   ? Enjoys: work out, spend time with family, hand work, music  ?   ? Exercise: foot pain limits - typically weight lifts 2 hours a  day  ? Diet: good, not as good since christmas  ?   ? Safety  ? Seat belts: Yes   ? Guns: Yes  and secure  ? Safe in relationships: Yes   ? ?Social Determinants of Health  ? ?Financial Resource Strain: Not on file  ?Food Insecurity: Not on file  ?Transportation Needs: Not on file  ?Physical Activity: Not on file  ?Stress: Not on file  ?Social Connections: Not  on file  ? ? ?Allergies: ?Allergies  ?Allergen Reactions  ? Tape Hives  ?  Tape adhesive long period of time   ? ? ?Outpatient Meds: ?Current Outpatient Medications  ?Medication Sig Dispense Refill  ? alendronate (FOSAMAX) 70 MG tablet Take 1 tablet (70 mg total) by mouth once a week. 12 tablet 0  ? ALPRAZolam (XANAX) 1 MG tablet Take 1 tablet (1 mg total) by mouth at bedtime as needed. 30 tablet 2  ? amLODipine (NORVASC) 10 MG tablet Take 1 tablet (10 mg total) by mouth daily. 90 tablet 0  ? atorvastatin (LIPITOR) 80 MG tablet Take 1 tablet (80 mg total) by mouth daily. 90 tablet 0  ? Calcium Carbonate-Vit D-Min (CALCIUM 1200 PO) Take 1 tablet by mouth daily.    ? CRANBERRY-CALCIUM PO Take 1 tablet by mouth daily.    ? estradiol (ESTRACE) 0.5 MG tablet Take 1 tablet (0.5 mg total) by mouth daily. 90 tablet 0  ? losartan (COZAAR) 25 MG tablet Take 1 tablet (25 mg total) by mouth daily. 90 tablet 0  ? montelukast (SINGULAIR) 10 MG tablet Take 1 tablet (10 mg total) by mouth as needed. 90 tablet 0  ? prazosin (MINIPRESS) 1 MG capsule Take 1-2 capsules (1-2 mg total) by mouth at bedtime. 90 capsule 1  ? vitamin B-12 (CYANOCOBALAMIN) 1000 MCG tablet Take 1 tablet (1,000 mcg total) by mouth daily. 90 tablet 3  ? zolpidem (AMBIEN) 5 MG tablet TAKE 1/2 TABLETS (2.5 MG TOTAL) BY MOUTH AT BEDTIME. 15 tablet 2  ? predniSONE (DELTASONE) 20 MG tablet Take 2 tablets (40 mg total) by mouth daily with breakfast for 3 days, THEN 1 tablet (20 mg total) daily with breakfast for 5 days, THEN 0.5 tablets (10 mg total) daily with breakfast for 5 days. (Patient not taking: Reported  on 08/26/2021) 13.5 tablet 0  ? ?No current facility-administered medications for this visit.  ? ? ? ? ?___________________________________________________________________ ?Objective  ? ?Exam: ? ?BP 132/78

## 2021-08-26 NOTE — Patient Instructions (Signed)
Your provider has requested that you go to the basement level for lab work before leaving today. Press "B" on the elevator. The lab is located at the first door on the left as you exit the elevator. ? ?You have been scheduled for a colonoscopy. Please follow written instructions given to you at your visit today.  ?Please pick up your prep supplies at the pharmacy within the next 1-3 days. ?If you use inhalers (even only as needed), please bring them with you on the day of your procedure. ? ?If you are age 77 or older, your body mass index should be between 23-30. Your Body mass index is 25.17 kg/m?Marland Kitchen If this is out of the aforementioned range listed, please consider follow up with your Primary Care Provider. ? ?If you are age 32 or younger, your body mass index should be between 19-25. Your Body mass index is 25.17 kg/m?Marland Kitchen If this is out of the aformentioned range listed, please consider follow up with your Primary Care Provider.  ? ?________________________________________________________ ? ?The [facility] providers would like to encourage you to use [facility] to communicate with providers for non-urgent requests or questions.  Due to long hold times on the telephone, sending your provider a message by [facility] may be a faster and more efficient way to get a response.  Please allow 48 business hours for a response.  Please remember that this is for non-urgent requests.  ?_______________________________________________________ ? ?Due to recent changes in healthcare laws, you may see the results of your imaging and laboratory studies on MyChart before your provider has had a chance to review them.  We understand that in some cases there may be results that are confusing or concerning to you. Not all laboratory results come back in the same time frame and the provider may be waiting for multiple results in order to interpret others.  Please give [place] 48 hours in order for your provider to thoroughly review all the results  before contacting the office for clarification of your results.  ? ?It was a pleasure to see you today! ? ?Thank you for trusting me with your gastrointestinal care!   ? ? ?

## 2021-08-29 ENCOUNTER — Telehealth: Payer: Self-pay | Admitting: Family Medicine

## 2021-08-29 ENCOUNTER — Telehealth: Payer: Self-pay | Admitting: *Deleted

## 2021-08-29 LAB — TISSUE TRANSGLUTAMINASE, IGA: (tTG) Ab, IgA: <1 U/mL

## 2021-08-29 LAB — IGA: Immunoglobulin A: 95 mg/dL (ref 70–320)

## 2021-08-29 NOTE — Telephone Encounter (Signed)
See other phone encounter opened. ?

## 2021-08-29 NOTE — Progress Notes (Signed)
Remote pacemaker transmission.   

## 2021-08-29 NOTE — Telephone Encounter (Signed)
Would recommend OV for evaluation to clarify diagnosis if no improving on antibiotic drops ?

## 2021-08-29 NOTE — Telephone Encounter (Signed)
Pt called stating that contracted the pink eye from her grand daughter. Pt states that she id doing the polymyxin drops and also started the steroids that was prescribed. Pt is asking what else should she be doing. Please advise.  ?

## 2021-08-29 NOTE — Telephone Encounter (Signed)
Left message on voicemail for patient to call the office back. ?May need an appointment. ?

## 2021-08-29 NOTE — Telephone Encounter (Signed)
Mychart sent to pt with recommendation  ?

## 2021-08-29 NOTE — Telephone Encounter (Signed)
PLEASE NOTE: All timestamps contained within this report are represented as [place] Standard Time. ?CONFIDENTIALTY NOTICE: This fax transmission is intended only for the addressee. It contains information that is legally privileged, confidential or ?otherwise protected from use or disclosure. If you are not the intended recipient, you are strictly prohibited from reviewing, disclosing, copying using ?or disseminating any of this information or taking any action in reliance on or regarding this information. If you have received this fax in error, please ?notify us immediately by telephone so that we can arrange for its return to [place]. Phone: [phone], Toll-Free: [phone], Fax: [phone] ?Page: 1 of 4 ?Call Id: [id number] ?[place] [facility] - Client ?TELEPHONE ADVICE RECORD ?[facility]? ?Patient ?Name: ?Betina FI ?Kildeer ?Gender: Female ?DOB: 1955/07/23 ?Age: 66 Y 59 M 15 D ?Return ?Phone ?Number: ?[phone] ?(Primary) ?Address: ?City/ ?State/ ?Zip: ?[place] ?[address] ?Client [facility] - Client ?Client Site [place] ?Provider Waunita Schooner- MD ?Contact Type Call ?Who Is Calling Patient / Member / Family / Caregiver ?Call Type Triage / Clinical ?Relationship To Patient Self ?Return Phone Number [phone] (Primary) ?Chief Complaint Eye Redness ?Reason for Call Symptomatic / Request for Health Information ?Initial Comment Caller states she woke up with pink eye. She wants ?to know about medication. ?Translation No ?Nurse Assessment ?Nurse: Hope Pigeon RN, Sharrie Rothman Date/Time (Eastern Time): 08/27/2021 2:44:30 PM ?Confirm and document reason for call. If ?symptomatic, describe symptoms. ?---caller states she was exposed to family who had pink ?eye. both eyes are red. one eye is swollen and a film ?all over her eyes. right eye is more swollen. symptoms ?started last night ?Does the patient have any new or worsening ?symptoms? ---Yes ?Will  a triage be completed? ---Yes ?Related visit to physician within the last 2 weeks? ---No ?Does the PT have any chronic conditions? (i.e. ?diabetes, asthma, this includes High risk factors for ?pregnancy, etc.) ?---Yes ?List chronic conditions. ---stage3 renal failure, pacemaker ?Is this a behavioral health or substance abuse call? ---No ?Nurse: Hope Pigeon, RN, Sharrie Rothman Date/Time (Eastern Time): 08/27/2021 3:00:26 PM ?Please select the assessment type ---Standing order ?Other current medications? ---Unknown ?Medication allergies? ---Unknown ?Pharmacy name and phone number. ---[facility] [phone] ?Guidelines ?Guideline Title Affirmed Question Affirmed Notes Nurse Date/Time (Eastern ?Time) ?Eye - Red Without ?Pus ?MODERATE eye ?pain or discomfort ?Hope Pigeon RN, Sharrie Rothman 08/27/2021 2:46:40 ?PM ?PLEASE NOTE: All timestamps contained within this report are represented as [place] Standard Time. ?CONFIDENTIALTY NOTICE: This fax transmission is intended only for the addressee. It contains information that is legally privileged, confidential or ?otherwise protected from use or disclosure. If you are not the intended recipient, you are strictly prohibited from reviewing, disclosing, copying using ?or disseminating any of this information or taking any action in reliance on or regarding this information. If you have received this fax in error, please ?notify us immediately by telephone so that we can arrange for its return to [place]. Phone: [phone], Toll-Free: [phone], Fax: [phone] ?Page: 2 of 4 ?Call Id: [id number] ?Guidelines ?Guideline Title Affirmed Question Affirmed Notes Nurse Date/Time (Eastern ?Time) ?(e.g., interferes with ?normal activities or ?awakens from sleep; ?more than mild) ?Disp. Time (Eastern ?Time) Disposition Final User ?08/27/2021 3:08:11 PM Pharmacy Call Hope Pigeon, RN, Sharrie Rothman ?Reason: [phone] [facility], they ?have [facility] medication on back order for ?[facility]. They stated that no local [facility] is showing to [facility]  this ?either ?08/27/2021 3:12:38 PM Pharmacy Call Hope Pigeon, RN, Sharrie Rothman ?Reason: [facility] [phone]. also ?states  they are on back order for the ?medication ?08/27/2021 3:19:33 PM Pharmacy Call Hope Pigeon, RN, Sharrie Rothman ?Reason: [facility] [phone] ?[facility], rn verbalised ?for caller to pick it up in an hr per ?pharmacist instructions ?08/27/2021 2:58:55 PM See HCP within 4 Hours (or ?PCP triage) ?Yes Raae, RN, Sharrie Rothman ?Caller Disagree/Comply Disagree ?Caller Understands Yes ?PreDisposition Home Care ?Care Advice Given Per Guideline ?* IF OFFICE WILL BE CLOSED AND NO PCP (PRIMARY CARE PROVIDER) SECOND-LEVEL TRIAGE: You need to be ?seen within the next 3 or 4 hours. A nearby Urgent Care Center [facility]) is often a good source of care. Another choice is to go to ?the ED. Go sooner if you become worse. * You become worse CALL BACK IF: CARE ADVICE given per Eye - Red without Pus ?(Adult) guideline. * IBUPROFEN (E.G., MOTRIN, ADVIL): Take 400 mg (two 200 mg pills) by mouth every 6 hours. The most ?you should take is 6 pills a day (1,200 mg total). * CAUTION: Do not take ibuprofen or naproxen if you have stomach problems, ?kidney disease, are pregnant, or have been told by your doctor to avoid this type of anti-inflammatory drug. Do not take ibuprofen or ?naproxen for more than 7 days without consulting your doctor. * Follow these dosing instructions unless your doctor (or NP/PA) has ?told you to take a different dose. PAIN MEDICINES - EXTRA NOTES AND WARNINGS: ?PLEASE NOTE: All timestamps contained within this report are represented as [place] Standard Time. ?CONFIDENTIALTY NOTICE: This fax transmission is intended only for the addressee. It contains information that is legally privileged, confidential or ?otherwise protected from use or disclosure. If you are not the intended recipient, you are strictly prohibited from reviewing, disclosing, copying using ?or disseminating any of this information or taking any action in reliance  on or regarding this information. If you have received this fax in error, please ?notify us immediately by telephone so that we can arrange for its return to [place]. Phone: [phone], Toll-Free: [phone], Fax: [phone] ?Page: 3 of 4 ?Call Id: [id number] ?Standing Orders ?Preparation Additional ?Instructions Route FrequencyDuration Nurse Comments User Name ?[facility] ?Drops 2 drops ?both eyes ?Eye ?Four ?Times ?Daily ?5 Days Raae, RN, Sharrie Rothman ?Referrals ?GO TO FACILITY REFUSED ?PLEASE NOTE: All timestamps contained within this report are represented as [place] Standard Time. ?CONFIDENTIALTY NOTICE: This fax transmission is intended only for the addressee. It contains information that is legally privileged, confidential or ?otherwise protected from use or disclosure. If you are not the intended recipient, you are strictly prohibited from reviewing, disclosing, copying using ?or disseminating any of this information or taking any action in reliance on or regarding this information. If you have received this fax in error, please ?notify us immediately by telephone so that we can arrange for its return to [place]. Phone: [phone], Toll-Free: [phone], Fax: [phone] ?Page: 4 of 4 ?Call Id: [id number] ?[facility] ?[address], [address] ?[place] [address] ?[phone] ?Fax: [phone] ?MEDICATION ORDER ?[place] Night - Client ?[place] ?Date: 08/27/2021 ?From: QI Department ?To: Waunita Schooner- MD ?This is an approved standing order given by our call center nurse on your behalf. Thank you. ?Date Eilene Ghazi Time): 08/27/2021 2:26:44 PM Triage RN: August Saucer, RN ?NAME: Maren Reamer ?PHONE NUMBER: [phone] (Primary) BIRTHDATE: 14-Jan-1956 ?[place] [address] ?CALLER: Self NAME: ?Rx Given ?Preparation Additional ?Instructions Route FrequencyDuration Nurse Comments User Name ?[facility] ?Drops 2  drops ?both eyes ?Eye ?Four ?  Times ?Daily ?5 Days Raae, RN, Sharrie Rothman ?No signature is required on standing orders. ?

## 2021-08-30 ENCOUNTER — Other Ambulatory Visit: Payer: Medicare Other

## 2021-08-30 DIAGNOSIS — K529 Noninfective gastroenteritis and colitis, unspecified: Secondary | ICD-10-CM

## 2021-08-31 LAB — C. DIFFICILE GDH AND TOXIN A/B
GDH ANTIGEN: NOT DETECTED
MICRO NUMBER:: 13189932
SPECIMEN QUALITY:: ADEQUATE
TOXIN A AND B: NOT DETECTED

## 2021-09-01 ENCOUNTER — Other Ambulatory Visit: Payer: Self-pay | Admitting: Family Medicine

## 2021-09-01 DIAGNOSIS — K52832 Lymphocytic colitis: Secondary | ICD-10-CM

## 2021-09-01 LAB — CLOSTRIDIUM DIFFICILE BY PCR: Toxigenic C. Difficile by PCR: NEGATIVE

## 2021-09-06 DIAGNOSIS — M170 Bilateral primary osteoarthritis of knee: Secondary | ICD-10-CM | POA: Insufficient documentation

## 2021-09-14 DIAGNOSIS — D2261 Melanocytic nevi of right upper limb, including shoulder: Secondary | ICD-10-CM | POA: Diagnosis not present

## 2021-09-14 DIAGNOSIS — Z85828 Personal history of other malignant neoplasm of skin: Secondary | ICD-10-CM | POA: Diagnosis not present

## 2021-09-14 DIAGNOSIS — R202 Paresthesia of skin: Secondary | ICD-10-CM | POA: Diagnosis not present

## 2021-09-14 DIAGNOSIS — D225 Melanocytic nevi of trunk: Secondary | ICD-10-CM | POA: Diagnosis not present

## 2021-09-14 DIAGNOSIS — D0439 Carcinoma in situ of skin of other parts of face: Secondary | ICD-10-CM | POA: Diagnosis not present

## 2021-09-23 ENCOUNTER — Other Ambulatory Visit: Payer: Self-pay

## 2021-09-23 ENCOUNTER — Telehealth: Payer: Self-pay | Admitting: Gastroenterology

## 2021-09-23 MED ORDER — PLENVU 140 G PO SOLR: 140.0000 g | ORAL | 0 refills | Status: DC

## 2021-09-23 NOTE — Telephone Encounter (Signed)
RX has been re-sent to [facility] as requested ?

## 2021-09-23 NOTE — Telephone Encounter (Signed)
Patient called requesting Plenvu be sent to pharmacy. Patient has procedure scheduled 09/30/21. [facility] send to [facility]: [address] [place], [place] [address]. Patient would like a call so she knows. Please advise. ? ?

## 2021-09-30 ENCOUNTER — Ambulatory Visit (AMBULATORY_SURGERY_CENTER): Payer: Medicare Other | Admitting: Gastroenterology

## 2021-09-30 ENCOUNTER — Encounter: Payer: Self-pay | Admitting: Gastroenterology

## 2021-09-30 VITALS — BP 105/60 | HR 73 | Temp 97.1°F | Resp 12 | Ht 62.0 in | Wt 137.0 lb

## 2021-09-30 DIAGNOSIS — F419 Anxiety disorder, unspecified: Secondary | ICD-10-CM | POA: Diagnosis not present

## 2021-09-30 DIAGNOSIS — K529 Noninfective gastroenteritis and colitis, unspecified: Secondary | ICD-10-CM

## 2021-09-30 DIAGNOSIS — R197 Diarrhea, unspecified: Secondary | ICD-10-CM | POA: Diagnosis not present

## 2021-09-30 DIAGNOSIS — I10 Essential (primary) hypertension: Secondary | ICD-10-CM | POA: Diagnosis not present

## 2021-09-30 DIAGNOSIS — F32A Depression, unspecified: Secondary | ICD-10-CM | POA: Diagnosis not present

## 2021-09-30 DIAGNOSIS — K6389 Other specified diseases of intestine: Secondary | ICD-10-CM | POA: Diagnosis not present

## 2021-09-30 MED ORDER — SODIUM CHLORIDE 0.9 % IV SOLN: 500.0000 mL | Freq: Once | INTRAVENOUS | Status: DC

## 2021-09-30 NOTE — Op Note (Signed)
[place] ?Patient Name: Kim Pennington ?Procedure Date: 09/30/2021 1:48 PM ?MRN: [id number] ?Endoscopist: Estill Cotta. Loletha Carrow , MD ?Age: 66 ?Referring MD:  ?Date of Birth: 03/11/1956 ?Gender: Female ?Account #: [id number] ?Procedure:                Colonoscopy ?Indications:              Chronic diarrhea, Microscopic colitis (Dx after C  ?                          diff years ago) - episodic symptoms ?Medicines:                Monitored Anesthesia Care ?Procedure:                Pre-Anesthesia Assessment: ?                          - Prior to the procedure, a History and Physical  ?                          was performed, and patient medications and  ?                          allergies were reviewed. The patient's tolerance of  ?                          previous anesthesia was also reviewed. The risks  ?                          and benefits of the procedure and the sedation  ?                          options and risks were discussed with the patient.  ?                          All questions were answered, and informed consent  ?                          was obtained. Prior Anticoagulants: The patient has  ?                          taken no previous anticoagulant or antiplatelet  ?                          agents. ASA Grade Assessment: III - A patient with  ?                          severe systemic disease. After reviewing the risks  ?                          and benefits, the patient was deemed in  ?                          satisfactory condition to undergo the procedure. ?  After obtaining informed consent, the colonoscope  ?                          was passed under direct vision. Throughout the  ?                          procedure, the patient's blood pressure, pulse, and  ?                          oxygen saturations were monitored continuously. The  ?                          CF [id number] #[id number] was introduced through the anus  ?                          and advanced to the  the terminal ileum, with  ?                          identification of the appendiceal orifice and IC  ?                          valve. The colonoscopy was performed without  ?                          difficulty. The patient tolerated the procedure  ?                          well. The quality of the bowel preparation was  ?                          excellent. The terminal ileum, ileocecal valve,  ?                          appendiceal orifice, and rectum were photographed. ?Scope In: 2:09:11 PM ?Scope Out: 2:25:27 PM ?Scope Withdrawal Time: 0 hours 11 minutes 26 seconds  ?Total Procedure Duration: 0 hours 16 minutes 16 seconds  ?Findings:                 The perianal and digital rectal examinations were  ?                          normal. ?                          The terminal ileum appeared normal. ?                          Normal mucosa was found in the entire colon.  ?                          Biopsies for histology were taken with a cold  ?                          forceps from the right colon and left colon for  ?  evaluation of microscopic colitis. ?                          The exam was otherwise without abnormality on  ?                          direct and retroflexion views. ?Complications:            No immediate complications. ?Estimated Blood Loss:     Estimated blood loss was minimal. ?Impression:               - The examined portion of the ileum was normal. ?                          - Normal mucosa in the entire examined colon.  ?                          Biopsied. ?                          - The examination was otherwise normal on direct  ?                          and retroflexion views. ?Recommendation:           - Patient has a contact number available for  ?                          emergencies. The signs and symptoms of potential  ?                          delayed complications were discussed with the  ?                          patient. Return to normal activities  tomorrow.  ?                          Written discharge instructions were provided to the  ?                          patient. ?                          - Resume previous diet. ?                          - Continue present medications. ?                          - Await pathology results. ?                          - Repeat colonoscopy in 10 years for screening  ?                          purposes. ?Kristianna Saperstein L. Loletha Carrow, MD ?09/30/2021 2:29:13 PM ?This report has been signed electronically. ?

## 2021-09-30 NOTE — Progress Notes (Signed)
PT taken to PACU. Monitors in place. VSS. Report given to RN. 

## 2021-09-30 NOTE — Progress Notes (Signed)
History and Physical: ? This patient presents for endoscopic testing for: ?Encounter Diagnosis  ?Name Primary?  ? Chronic diarrhea Yes  ? ? ?Clinical details in 08/26/21 office consult note. ? ?Patient is otherwise without complaints or active issues today. ? ? ?Past Medical History: ?Past Medical History:  ?Diagnosis Date  ? Acquired solitary kidney   ? right nephrectomy  ? Anemia   ? Anxiety   ? Arthritis   ? C. difficile diarrhea   ? Depression   ? Headache   ? Hepatitis B   ? Hyperlipidemia   ? Hypertension   ? Kidney disease, chronic, stage III (GFR 30-59 ml/min) (HCC)   ? Kidney stones   ? Lymphocytic colitis   ? Memory loss   ? Osteoporosis   ? Pacemaker   ? Renal failure, chronic, stage 3 (moderate) ([facility]) 2015  ? Left kidney   ? Skin cancer   ? ? ? ?Past Surgical History: ?Past Surgical History:  ?Procedure Laterality Date  ? ABDOMINAL HYSTERECTOMY  11/2006  ? ABDOMINOPLASTY    ? BREAST LUMPECTOMY Right 1979  ? CARDIAC CATHETERIZATION  04/24/2014  ? CARPAL TUNNEL RELEASE Bilateral 03/29/2009  ? CESAREAN SECTION    ? x4  ? COLONOSCOPY    ? FOOT SURGERY Bilateral   ? HIP ARTHROSCOPY Right 09/14/2016  ? reduction of lesser trochanter   ? HIP ARTHROSCOPY Left 04/25/2016  ? w/ femoroplasty  ? left thumb tendon  04/25/2016  ? LIPOSUCTION    ? LUMBAR LAMINECTOMY  11/23/2019  ? foraminotomy, discectomy, facectomy  ? NEPHRECTOMY Right 2009  ? PACEMAKER IMPLANT  04/24/2014  ? RHINOPLASTY    ? x 2, 1999, 2003  ? ROTATOR CUFF REPAIR Right   ? SHOULDER ARTHROSCOPY Right 12/30/2018  ? x2  ? TONSILLECTOMY  1961  ? [facility]  ? UPPER GASTROINTESTINAL ENDOSCOPY    ? WEIL OSTEOTOMY Bilateral 11/27/2019  ? ? ?Allergies: ?Allergies  ?Allergen Reactions  ? Tape Hives  ?  Tape adhesive long period of time   ? ? ?Outpatient Meds: ?Current Outpatient Medications  ?Medication Sig Dispense Refill  ? ALPRAZolam (XANAX) 1 MG tablet Take 1 tablet (1 mg total) by mouth at bedtime as needed. 30 tablet 2  ? amLODipine  (NORVASC) 10 MG tablet Take 1 tablet (10 mg total) by mouth daily. 90 tablet 0  ? atorvastatin (LIPITOR) 80 MG tablet Take 1 tablet (80 mg total) by mouth daily. 90 tablet 0  ? Calcium Carbonate-Vit D-Min (CALCIUM 1200 PO) Take 1 tablet by mouth daily.    ? CRANBERRY-CALCIUM PO Take 1 tablet by mouth daily.    ? estradiol (ESTRACE) 0.5 MG tablet Take 1 tablet (0.5 mg total) by mouth daily. 90 tablet 0  ? losartan (COZAAR) 25 MG tablet Take 1 tablet (25 mg total) by mouth daily. 90 tablet 0  ? montelukast (SINGULAIR) 10 MG tablet Take 1 tablet (10 mg total) by mouth as needed. 90 tablet 0  ? prazosin (MINIPRESS) 1 MG capsule Take 1-2 capsules (1-2 mg total) by mouth at bedtime. 90 capsule 1  ? vitamin B-12 (CYANOCOBALAMIN) 1000 MCG tablet Take 1 tablet (1,000 mcg total) by mouth daily. 90 tablet 3  ? zolpidem (AMBIEN) 5 MG tablet TAKE 1/2 TABLETS (2.5 MG TOTAL) BY MOUTH AT BEDTIME. 15 tablet 2  ? alendronate (FOSAMAX) 70 MG tablet Take 1 tablet (70 mg total) by mouth once a week. 12 tablet 0  ? ?Current Facility-Administered Medications  ?Medication Dose  Route Frequency Provider Last Rate Last Admin  ? 0.9 %  sodium chloride infusion  500 mL Intravenous Once Doran Stabler, MD      ? ? ? ? ?___________________________________________________________________ ?Objective  ? ?Exam: ? ?BP (!) 136/91   Pulse 92   Temp (!) 97.1 ?F (36.2 ?C)   Ht '5\' 2"'$  (1.575 m)   Wt 137 lb (62.1 kg)   SpO2 97%   BMI 25.06 kg/m?  ? ?CV: RRR without murmur, S1/S2 ?Resp: clear to auscultation bilaterally, normal RR and effort noted ?GI: soft, no tenderness, with active bowel sounds. ? ? ?Assessment: ?Encounter Diagnosis  ?Name Primary?  ? Chronic diarrhea Yes  ? ? ? ?Plan: ?Colonoscopy ? The benefits and risks of the planned procedure were described in detail with the patient or (when appropriate) their health care proxy.  Risks were outlined as including, but not limited to, bleeding, infection, perforation, adverse medication  reaction leading to cardiac or pulmonary decompensation, pancreatitis (if ERCP).  The limitation of incomplete mucosal visualization was also discussed.  No guarantees or warranties were given. ? ? ? ?The patient is appropriate for an endoscopic procedure in the ambulatory setting. ? ? - Wilfrid Lund, MD ? ? ? ? ?

## 2021-09-30 NOTE — Patient Instructions (Signed)
Resume previous diet and medications. Awaiting pathology results. Repeat Colonoscopy in 10 years for surveillance. ? ?YOU HAD AN ENDOSCOPIC PROCEDURE TODAY AT [facility]:   Refer to the procedure report that was given to you for any specific questions about what was found during the examination.  If the procedure report does not answer your questions, please call your gastroenterologist to clarify.  If you requested that your care partner not be given the details of your procedure findings, then the procedure report has been included in a sealed envelope for you to review at your convenience later. ? ?YOU SHOULD EXPECT: Some feelings of bloating in the abdomen. Passage of more gas than usual.  Walking can help get rid of the air that was put into your GI tract during the procedure and reduce the bloating. If you had a lower endoscopy (such as a colonoscopy or flexible sigmoidoscopy) you may notice spotting of blood in your stool or on the toilet paper. If you underwent a bowel prep for your procedure, you may not have a normal bowel movement for a few days. ? ?Please Note:  You might notice some irritation and congestion in your nose or some drainage.  This is from the oxygen used during your procedure.  There is no need for concern and it should clear up in a day or so. ? ?SYMPTOMS TO REPORT IMMEDIATELY: ? ?Following lower endoscopy (colonoscopy or flexible sigmoidoscopy): ? Excessive amounts of blood in the stool ? Significant tenderness or worsening of abdominal pains ? Swelling of the abdomen that is new, acute ? Fever of 100?F or higher ? ?For urgent or emergent issues, a gastroenterologist can be reached at any hour by calling [phone]. ?Do not use MyChart messaging for urgent concerns.  ? ? ?DIET:  We do recommend a small meal at first, but then you may proceed to your regular diet.  Drink plenty of fluids but you should avoid alcoholic beverages for 24 hours. ? ?ACTIVITY:  You should  plan to take it easy for the rest of today and you should NOT DRIVE or use heavy machinery until tomorrow (because of the sedation medicines used during the test).   ? ?FOLLOW UP: ?Our staff will call the number listed on your records 48-72 hours following your procedure to check on you and address any questions or concerns that you may have regarding the information given to you following your procedure. If we do not reach you, we will leave a message.  We will attempt to reach you two times.  During this call, we will ask if you have developed any symptoms of COVID 19. If you develop any symptoms (ie: fever, flu-like symptoms, shortness of breath, cough etc.) before then, please call [phone].  If you test positive for Covid 19 in the 2 weeks post procedure, please call and report this information to [place].   ? ?If any biopsies were taken you will be contacted by phone or by letter within the next 1-3 weeks.  Please call us at [phone] if you have not heard about the biopsies in 3 weeks.  ? ? ?SIGNATURES/CONFIDENTIALITY: ?You and/or your care partner have signed paperwork which will be entered into your electronic medical record.  These signatures attest to the fact that that the information above on your After Visit Summary has been reviewed and is understood.  Full responsibility of the confidentiality of this discharge information lies with you and/or your care-partner.  ?

## 2021-10-04 ENCOUNTER — Telehealth: Payer: Self-pay | Admitting: *Deleted

## 2021-10-04 NOTE — Telephone Encounter (Signed)
?  Follow up Call- ? ? ?  09/30/2021  ?  1:07 PM  ?Call back number  ?Post procedure Call Back phone  # [phone] (husband phone)  ?Permission to leave phone message Yes  ?  ? ?Patient questions: ? ?Do you have a fever, pain , or abdominal swelling? No. ?Pain Score  0 * ? ?Have you tolerated food without any problems? Yes.   ? ?Have you been able to return to your normal activities? Yes.   ? ?Do you have any questions about your discharge instructions: ?Diet   No. ?Medications  No. ?Follow up visit  No. ? ?Do you have questions or concerns about your Care? No. ? ?Actions: ?* If pain score is 4 or above: ?No action needed, pain <4. ? ? ?

## 2021-10-10 ENCOUNTER — Telehealth: Payer: Self-pay | Admitting: Gastroenterology

## 2021-10-10 ENCOUNTER — Telehealth: Payer: Self-pay | Admitting: Family Medicine

## 2021-10-10 DIAGNOSIS — B002 Herpesviral gingivostomatitis and pharyngotonsillitis: Secondary | ICD-10-CM

## 2021-10-10 DIAGNOSIS — M170 Bilateral primary osteoarthritis of knee: Secondary | ICD-10-CM | POA: Diagnosis not present

## 2021-10-10 NOTE — Telephone Encounter (Signed)
Yes, I sent her a portal message with those results after having been out of the office most the last week.   ?I cannot account for the reported "zapping feeling" that she had previously described to me as well, but suspect that her irregular bowel habits are IBS.  ? ?It is difficult to know if the latter will respond to medicines, but we will try low-dose dicyclomine since we do not want it to trigger constipation. ? ?Dicyclomine 10 mg ?1 capsule 2-3 times daily as needed for abdominal cramps or diarrhea. ?Dispense 45, refill 1 ? ? ?She can also try IBGard 2 capsules once daily to see if that reduces her feeling of gut inflammation. ? ?Offer an office visit for further advice if needed. ? ?-HD ?

## 2021-10-10 NOTE — Telephone Encounter (Signed)
Please call patient to ask her why she takes this medication and what her regular doses for it. ? ?I cannot see where we have discussed this in the past. ?

## 2021-10-10 NOTE — Telephone Encounter (Signed)
Patient called requesting the results of her recent colonoscopy.  She states she has not heard anything and would like verbal results. ? ?Thank you. ?

## 2021-10-10 NOTE — Telephone Encounter (Signed)
Mychart sent to pt.

## 2021-10-10 NOTE — Telephone Encounter (Signed)
Returned call to patient. I informed her that Dr. Loletha Carrow did send a MyChart message with her results today. I informed the pt of her pathology results and recommendations regarding Dicyclomine. Pt states that she has continued symptoms. She reports that her bowels habits alternate from diarrhea to constipation. Pt reports abdominal cramping. Pt also reports pain from her tongue all the way through her GI system. Pt states that it feels a "zapping" feeling from her tongue all the way through her GI system. Please advise, thanks. ?

## 2021-10-10 NOTE — Telephone Encounter (Signed)
Caller Name: Kayelee Herbig ?Call back phone #: [phone] ? ?MEDICATION(S): Aciclovir '400mg'$  ? ? ?Days of Med Remaining: none ? ?Has the patient contacted their pharmacy (YES/NO)?  No, she said its never been sent to her new pharmacy. I didn't see anywhere on med list but she said that she has talked to Dr Einar Pheasant about it, she said it was probably last [place] by previous [place].  ?IF YES, when and what did the pharmacy advise?  ?IF NO, request that the patient contact the pharmacy for the refills in the future.  ?           The pharmacy will send an electronic request (except for controlled medications). ? ?Preferred Pharmacy: [facility] on [address]  ? ?~~~Please advise patient/caregiver to allow 2-3 business days to process RX refills.  ?

## 2021-10-11 DIAGNOSIS — B002 Herpesviral gingivostomatitis and pharyngotonsillitis: Secondary | ICD-10-CM | POA: Insufficient documentation

## 2021-10-11 MED ORDER — DICYCLOMINE HCL 10 MG PO CAPS: ORAL_CAPSULE | ORAL | 1 refills | Status: DC

## 2021-10-11 MED ORDER — ACYCLOVIR 400 MG PO TABS
ORAL_TABLET | ORAL | 1 refills | Status: DC
Start: 2021-10-11 — End: 2021-12-16

## 2021-10-11 NOTE — Telephone Encounter (Signed)
Pt's response: "Take it just occasionally whenever I get a cold sore - with a nasty headcold I have now.  400 mg" ?

## 2021-10-11 NOTE — Telephone Encounter (Signed)
Called and spoke with patient regarding Dr. Loletha Carrow' recommendations . Pt would like RX for Dicyclomine sent to pharmacy on file. Pt will contact us to set up an office visit if symptoms persist despite recommendations. Pt verbalized understanding of information and had no concerns at the end of the call. ?

## 2021-10-11 NOTE — Addendum Note (Signed)
Addended by: Yevette Edwards on: 10/11/2021 08:28 AM ? ? Modules accepted: Orders ? ?

## 2021-10-11 NOTE — Addendum Note (Signed)
Addended by: Waunita Schooner R on: 10/11/2021 08:09 AM ? ? Modules accepted: Orders ? ?

## 2021-10-17 DIAGNOSIS — M170 Bilateral primary osteoarthritis of knee: Secondary | ICD-10-CM | POA: Diagnosis not present

## 2021-10-21 ENCOUNTER — Encounter: Payer: Self-pay | Admitting: Cardiology

## 2021-10-24 DIAGNOSIS — M170 Bilateral primary osteoarthritis of knee: Secondary | ICD-10-CM | POA: Diagnosis not present

## 2021-11-01 ENCOUNTER — Other Ambulatory Visit: Payer: Self-pay | Admitting: Family Medicine

## 2021-11-02 ENCOUNTER — Other Ambulatory Visit: Payer: Self-pay | Admitting: Gastroenterology

## 2021-11-12 ENCOUNTER — Other Ambulatory Visit: Payer: Self-pay | Admitting: Family Medicine

## 2021-11-12 DIAGNOSIS — B002 Herpesviral gingivostomatitis and pharyngotonsillitis: Secondary | ICD-10-CM

## 2021-11-14 NOTE — Telephone Encounter (Signed)
Was sent in prn treatment <1 month ago. If needing refill she needs and office visit to discuss suppression treatment

## 2021-11-14 NOTE — Telephone Encounter (Signed)
Last filled 10/11/21 #30 with 1 refill

## 2021-11-16 DIAGNOSIS — B002 Herpesviral gingivostomatitis and pharyngotonsillitis: Secondary | ICD-10-CM

## 2021-11-16 NOTE — Telephone Encounter (Signed)
Mychart message sent.

## 2021-11-17 ENCOUNTER — Other Ambulatory Visit: Payer: Self-pay | Admitting: Gastroenterology

## 2021-11-18 ENCOUNTER — Other Ambulatory Visit: Payer: Self-pay | Admitting: Family Medicine

## 2021-11-18 DIAGNOSIS — F4310 Post-traumatic stress disorder, unspecified: Secondary | ICD-10-CM

## 2021-11-22 DIAGNOSIS — M25511 Pain in right shoulder: Secondary | ICD-10-CM | POA: Diagnosis not present

## 2021-11-22 DIAGNOSIS — M25512 Pain in left shoulder: Secondary | ICD-10-CM | POA: Diagnosis not present

## 2021-11-24 ENCOUNTER — Encounter: Payer: Medicare Other | Admitting: Family Medicine

## 2021-11-28 ENCOUNTER — Ambulatory Visit (INDEPENDENT_AMBULATORY_CARE_PROVIDER_SITE_OTHER): Payer: Medicare Other | Admitting: Family Medicine

## 2021-11-28 ENCOUNTER — Encounter: Payer: Self-pay | Admitting: Family Medicine

## 2021-11-28 VITALS — BP 118/80 | HR 89 | Temp 97.4°F | Ht 63.0 in | Wt 138.2 lb

## 2021-11-28 DIAGNOSIS — M810 Age-related osteoporosis without current pathological fracture: Secondary | ICD-10-CM

## 2021-11-28 DIAGNOSIS — H269 Unspecified cataract: Secondary | ICD-10-CM

## 2021-11-28 DIAGNOSIS — I10 Essential (primary) hypertension: Secondary | ICD-10-CM | POA: Diagnosis not present

## 2021-11-28 DIAGNOSIS — Z79890 Hormone replacement therapy (postmenopausal): Secondary | ICD-10-CM | POA: Diagnosis not present

## 2021-11-28 DIAGNOSIS — B002 Herpesviral gingivostomatitis and pharyngotonsillitis: Secondary | ICD-10-CM

## 2021-11-28 DIAGNOSIS — Z0000 Encounter for general adult medical examination without abnormal findings: Secondary | ICD-10-CM | POA: Diagnosis not present

## 2021-11-28 DIAGNOSIS — H538 Other visual disturbances: Secondary | ICD-10-CM

## 2021-11-28 NOTE — Assessment & Plan Note (Signed)
She will slowly start to taper her estrogen to every other day as tolerated.

## 2021-11-29 LAB — COMPREHENSIVE METABOLIC PANEL
ALT: 38 U/L — ABNORMAL HIGH (ref 0–35)
AST: 30 U/L (ref 0–37)
Albumin: 4.5 g/dL (ref 3.5–5.2)
Alkaline Phosphatase: 45 U/L (ref 39–117)
BUN: 28 mg/dL — ABNORMAL HIGH (ref 6–23)
CO2: 25 mEq/L (ref 19–32)
Calcium: 10 mg/dL (ref 8.4–10.5)
Chloride: 101 mEq/L (ref 96–112)
Creatinine, Ser: 1.01 mg/dL (ref 0.40–1.20)
GFR: 58.64 mL/min — ABNORMAL LOW (ref >60.00)
Glucose, Bld: 97 mg/dL (ref 70–99)
Potassium: 4.4 mEq/L (ref 3.5–5.1)
Sodium: 135 mEq/L (ref 135–145)
Total Bilirubin: 0.5 mg/dL (ref 0.2–1.2)
Total Protein: 6.9 g/dL (ref 6.0–8.3)

## 2021-11-29 LAB — CBC
HCT: 40.3 % (ref 36.0–46.0)
Hemoglobin: 13.7 g/dL (ref 12.0–15.0)
MCHC: 33.9 g/dL (ref 30.0–36.0)
MCV: 94.5 fl (ref 78.0–100.0)
Platelets: 220 10*3/uL (ref 150.0–400.0)
RBC: 4.27 Mil/uL (ref 3.87–5.11)
RDW: 13 % (ref 11.5–15.5)
WBC: 7.6 10*3/uL (ref 4.0–10.5)

## 2021-11-29 LAB — VITAMIN D 25 HYDROXY (VIT D DEFICIENCY, FRACTURES): VITD: 82.09 ng/mL (ref 30.00–100.00)

## 2021-12-14 DIAGNOSIS — M25511 Pain in right shoulder: Secondary | ICD-10-CM | POA: Diagnosis not present

## 2021-12-14 DIAGNOSIS — M25512 Pain in left shoulder: Secondary | ICD-10-CM | POA: Diagnosis not present

## 2021-12-16 MED ORDER — ACYCLOVIR 400 MG PO TABS: 400.0000 mg | ORAL_TABLET | Freq: Two times a day (BID) | ORAL | 1 refills | Status: DC

## 2021-12-18 ENCOUNTER — Other Ambulatory Visit: Payer: Self-pay | Admitting: Primary Care

## 2022-01-03 ENCOUNTER — Other Ambulatory Visit: Payer: Self-pay | Admitting: Family Medicine

## 2022-01-05 ENCOUNTER — Ambulatory Visit: Payer: Medicare Other | Admitting: Podiatry

## 2022-01-05 DIAGNOSIS — M7752 Other enthesopathy of left foot: Secondary | ICD-10-CM

## 2022-01-05 DIAGNOSIS — M7751 Other enthesopathy of right foot: Secondary | ICD-10-CM | POA: Diagnosis not present

## 2022-01-06 ENCOUNTER — Encounter: Payer: Self-pay | Admitting: Podiatry

## 2022-01-06 NOTE — Progress Notes (Signed)
Subjective:  Patient ID: Kim Pennington, female    DOB: 01/22/1956,  MRN: [id number]  Chief Complaint  Patient presents with   Foot Pain    66 y.o. female presents with the above complaint.  Patient presents for follow-up of bilateral second MTP capsulitis.  She states she is doing better.  She would like to know if she can do another injection.  Her injection helps overall and her pain is much more tolerable now.  Review of Systems: Negative except as noted in the HPI. Denies N/V/F/Ch.  Past Medical History:  Diagnosis Date   Acquired solitary kidney    right nephrectomy   Anemia    Anxiety    Arthritis    C. difficile diarrhea    Depression    Headache    Hepatitis B    Hyperlipidemia    Hypertension    Kidney disease, chronic, stage III (GFR 30-59 ml/min) (HCC)    Kidney stones    Lymphocytic colitis    Memory loss    Osteoporosis    Pacemaker    Renal failure, chronic, stage 3 (moderate) ([place]) 2015   Left kidney    Skin cancer     Current Outpatient Medications:    acyclovir (ZOVIRAX) 400 MG tablet, Take 1 tablet (400 mg total) by mouth 2 (two) times daily., Disp: 180 tablet, Rfl: 1   alendronate (FOSAMAX) 70 MG tablet, TAKE 1 TABLET(70 MG) BY MOUTH 1 TIME A WEEK, Disp: 12 tablet, Rfl: 3   ALPRAZolam (XANAX) 1 MG tablet, Take 1 tablet (1 mg total) by mouth at bedtime as needed., Disp: 30 tablet, Rfl: 2   amLODipine (NORVASC) 10 MG tablet, Take 1 tablet (10 mg total) by mouth daily., Disp: 90 tablet, Rfl: 0   atorvastatin (LIPITOR) 80 MG tablet, TAKE 1 TABLET(80 MG) BY MOUTH DAILY, Disp: 90 tablet, Rfl: 0   Calcium Carbonate-Vit D-Min (CALCIUM 1200 PO), Take 1 tablet by mouth daily., Disp: , Rfl:    CRANBERRY-CALCIUM PO, Take 1 tablet by mouth daily., Disp: , Rfl:    dicyclomine (BENTYL) 10 MG capsule, TAKE 1 CAPSULE(10 MG) BY MOUTH 2 TO 3 TIMES A DAY AS NEEDED FOR ABDOMINAL CRAMPS OR DIARRHEA, Disp: 45 capsule, Rfl: 1   estradiol (ESTRACE) 0.5 MG tablet, TAKE 1  TABLET BY MOUTH DAILY, Disp: 90 tablet, Rfl: 0   losartan (COZAAR) 25 MG tablet, TAKE 1 TABLET(25 MG) BY MOUTH DAILY, Disp: 90 tablet, Rfl: 3   montelukast (SINGULAIR) 10 MG tablet, TAKE 1 TABLET BY MOUTH DAILY AS NEEDED, Disp: 90 tablet, Rfl: 0   prazosin (MINIPRESS) 1 MG capsule, TAKE 1 TO 2 CAPSULES(1 TO 2 MG) BY MOUTH AT BEDTIME, Disp: 90 capsule, Rfl: 1   vitamin B-12 (CYANOCOBALAMIN) 1000 MCG tablet, Take 1 tablet (1,000 mcg total) by mouth daily., Disp: 90 tablet, Rfl: 3  Social History   Tobacco Use  Smoking Status Never  Smokeless Tobacco Never    Allergies  Allergen Reactions   Tape Hives    Tape adhesive long period of time    Objective:  There were no vitals filed for this visit. There is no height or weight on file to calculate BMI. Constitutional Well developed. Well nourished.  Vascular Dorsalis pedis pulses palpable bilaterally. Posterior tibial pulses palpable bilaterally. Capillary refill normal to all digits.  No cyanosis or clubbing noted. Pedal hair growth normal.  Neurologic Normal speech. Oriented to person, place, and time. Epicritic sensation to light touch grossly present bilaterally.  Dermatologic Nails  well groomed and normal in appearance. No open wounds. No skin lesions.  Orthopedic:  Bilateral second interspace neuroma versus capsulitis.  Positive Mulder's click.  Positive [place] sign left greater than right side.  Previous scarring/surgical scarring noted.  Pain on palpation to the left first tarsometatarsal joint.  osteoarthritic changes were clinically palpated.  No pain with resisted dorsiflexion and plantarflexion of the digit ruling out tendinitis   Radiographs: Bilateral second interspace: 3 views of skeletally mature adult bilateral foot: Previous hardware noted in the metatarsal second and third.  No sign of loosening or breaking noted.  There appear to be snap off screws.  Positive [place] sign noted on x-ray.  No elevatus noted.   Hardware appears intact   MRI results in the media tab of the chart.  Please refer to that.   Assessment:   1. Capsulitis of metatarsophalangeal (MTP) joint of left foot   2. Capsulitis of metatarsophalangeal (MTP) joint of right foot       Plan:  Patient was evaluated and treated and all questions answered.  Left first tarsometatarsal joint capsulitis -Clinically improving.  Bilateral second interspace neuroma/capsulitis left greater than right -I explained to patient the etiology of neuroma and capsulitis and various treatment options were discussed.  Given that patient has previous surgery done to both of the feet with associated scarring in the interspace of second I believe there is likely a lot of scar tissue/vibratory tissue that could be causing her pain.  I believe patient may benefit from a steroid injection help decrease the pain associated with inflammation.  Patient agrees with the plan would like to proceed with injection. - another steroid injection was performed at bilateral second MPJ using 1% plain Lidocaine and 10 mg of Kenalog. This was well tolerated.  -MRI was reviewed with the patient in extensive detail which showed primarily sesamoiditis with arthritic changes to the first metatarsophalangeal joint -There is also degenerative changes to the second metatarsophalangeal joint moderate in amount. No follow-ups on file.

## 2022-01-16 ENCOUNTER — Encounter: Payer: Self-pay | Admitting: Family Medicine

## 2022-01-16 DIAGNOSIS — Z1231 Encounter for screening mammogram for malignant neoplasm of breast: Secondary | ICD-10-CM

## 2022-01-19 ENCOUNTER — Ambulatory Visit: Payer: Medicare Other | Admitting: Family Medicine

## 2022-01-20 ENCOUNTER — Ambulatory Visit (INDEPENDENT_AMBULATORY_CARE_PROVIDER_SITE_OTHER): Payer: Medicare Other | Admitting: Family Medicine

## 2022-01-20 ENCOUNTER — Encounter: Payer: Self-pay | Admitting: Family Medicine

## 2022-01-20 ENCOUNTER — Telehealth: Payer: Self-pay | Admitting: Family Medicine

## 2022-01-20 ENCOUNTER — Ambulatory Visit (INDEPENDENT_AMBULATORY_CARE_PROVIDER_SITE_OTHER)
Admission: RE | Admit: 2022-01-20 | Discharge: 2022-01-20 | Disposition: A | Discharge status: Home / Self Care | Payer: Medicare Other | Source: Ambulatory Visit | Attending: Family Medicine | Admitting: Family Medicine

## 2022-01-20 VITALS — BP 116/76 | HR 68 | Temp 97.8°F | Ht 63.0 in | Wt 137.0 lb

## 2022-01-20 DIAGNOSIS — M5416 Radiculopathy, lumbar region: Secondary | ICD-10-CM

## 2022-01-20 DIAGNOSIS — M545 Low back pain, unspecified: Secondary | ICD-10-CM | POA: Diagnosis not present

## 2022-01-20 DIAGNOSIS — Z9889 Other specified postprocedural states: Secondary | ICD-10-CM

## 2022-01-20 DIAGNOSIS — R200 Anesthesia of skin: Secondary | ICD-10-CM

## 2022-01-20 MED ORDER — PREDNISONE 20 MG PO TABS: ORAL_TABLET | ORAL | 0 refills | Status: DC

## 2022-01-20 MED ORDER — ONDANSETRON HCL 4 MG PO TABS: 4.0000 mg | ORAL_TABLET | Freq: Three times a day (TID) | ORAL | 0 refills | Status: DC | PRN

## 2022-01-20 NOTE — Telephone Encounter (Signed)
Pharmacy called asking about the quantity of  medication, he want to know if it correct or was there an error? predniSONE (DELTASONE) 20 MG tablet

## 2022-01-20 NOTE — Patient Instructions (Addendum)
Start prednisone taper today.  Can use tylenol ES 2 tabs 2-3 times daily  Can use zofran for nausea as needed.   We will call X-ray results. We will work on referral to Dr. Saintclair Halsted as requested.

## 2022-01-20 NOTE — Telephone Encounter (Signed)
Dr. Diona Browner will send in corrected amount.

## 2022-01-20 NOTE — Progress Notes (Signed)
Patient ID: Kim Pennington, female    DOB: September 07, 1955, 66 y.o.   MRN: [id number]  This visit was conducted in person.  BP 116/76 (BP Location: Left Arm, Patient Position: Standing)   Pulse 68   Temp 97.8 F (36.6 C) (Oral)   Ht '5\' 3"'$  (1.6 m)   Wt 137 lb (62.1 kg)   SpO2 98%   BMI 24.27 kg/m    CC:  Chief Complaint  Patient presents with   Back Pain    History of back surgery, Pain started Wednesday morning    Subjective:   HPI: Kim Pennington is a 66 y.o. female  pt of  Dr. Verda Cumins presenting on 01/20/2022 for Back Pain (History of back surgery,/Pain started Wednesday morning)  She reports new onset  2 days ago flare up of pain in left and central low back pain.  Occurred after weeding.. felt pop in back. 8/10 on pain scale, making her nauseous.  Radiated to left leg.  No falls.   Has numbness  to left knee, leg feels weak and heavy, cannot cross legs.  Feels like dragging foot, shuffling Pain keeping her up at night.   Some issues holding urine, more like urgency then incontinence. mild numbness in groin area.  She is usually very active working out 2-3 hours a day, core strengthening   History of back surgery  laminectomy 2011.. in [place]. History of kidney stones, s/p nephrectomy   Using tylenol ES, heat/ice, topical  Bioflex.   If referral need she would like to see Dr. Saintclair Halsted.    Relevant past medical, surgical, family and social history reviewed and updated as indicated. Interim medical history since our last visit reviewed. Allergies and medications reviewed and updated. Outpatient Medications Prior to Visit  Medication Sig Dispense Refill   acyclovir (ZOVIRAX) 400 MG tablet Take 1 tablet (400 mg total) by mouth 2 (two) times daily. 180 tablet 1   alendronate (FOSAMAX) 70 MG tablet TAKE 1 TABLET(70 MG) BY MOUTH 1 TIME A WEEK 12 tablet 3   ALPRAZolam (XANAX) 1 MG tablet Take 1 tablet (1 mg total) by mouth at bedtime as needed. 30 tablet 2   amLODipine  (NORVASC) 10 MG tablet Take 1 tablet (10 mg total) by mouth daily. 90 tablet 0   atorvastatin (LIPITOR) 80 MG tablet TAKE 1 TABLET(80 MG) BY MOUTH DAILY 90 tablet 0   Calcium Carbonate-Vit D-Min (CALCIUM 1200 PO) Take 1 tablet by mouth daily.     CRANBERRY-CALCIUM PO Take 1 tablet by mouth daily.     dicyclomine (BENTYL) 10 MG capsule TAKE 1 CAPSULE(10 MG) BY MOUTH 2 TO 3 TIMES A DAY AS NEEDED FOR ABDOMINAL CRAMPS OR DIARRHEA 45 capsule 1   losartan (COZAAR) 25 MG tablet TAKE 1 TABLET(25 MG) BY MOUTH DAILY 90 tablet 3   montelukast (SINGULAIR) 10 MG tablet TAKE 1 TABLET BY MOUTH DAILY AS NEEDED 90 tablet 0   prazosin (MINIPRESS) 1 MG capsule TAKE 1 TO 2 CAPSULES(1 TO 2 MG) BY MOUTH AT BEDTIME 90 capsule 1   vitamin B-12 (CYANOCOBALAMIN) 1000 MCG tablet Take 1 tablet (1,000 mcg total) by mouth daily. 90 tablet 3   estradiol (ESTRACE) 0.5 MG tablet TAKE 1 TABLET BY MOUTH DAILY 90 tablet 0   No facility-administered medications prior to visit.     Per HPI unless specifically indicated in ROS section below Review of Systems  Constitutional:  Negative for fatigue and fever.  HENT:  Negative for congestion.   Eyes:  Negative for pain.  Respiratory:  Negative for cough and shortness of breath.   Cardiovascular:  Negative for chest pain, palpitations and leg swelling.  Gastrointestinal:  Negative for abdominal pain.  Genitourinary:  Negative for dysuria and vaginal bleeding.  Musculoskeletal:  Positive for back pain.  Neurological:  Negative for syncope, light-headedness and headaches.  Psychiatric/Behavioral:  Negative for dysphoric mood.    Objective:  BP 116/76 (BP Location: Left Arm, Patient Position: Standing)   Pulse 68   Temp 97.8 F (36.6 C) (Oral)   Ht '5\' 3"'$  (1.6 m)   Wt 137 lb (62.1 kg)   SpO2 98%   BMI 24.27 kg/m   Wt Readings from Last 3 Encounters:  01/20/22 137 lb (62.1 kg)  11/28/21 138 lb 4 oz (62.7 kg)  09/30/21 137 lb (62.1 kg)      Physical Exam Constitutional:       General: She is not in acute distress.    Appearance: Normal appearance. She is well-developed. She is not ill-appearing or toxic-appearing.  HENT:     Head: Normocephalic.     Right Ear: Hearing, tympanic membrane, ear canal and external ear normal. Tympanic membrane is not erythematous, retracted or bulging.     Left Ear: Hearing, tympanic membrane, ear canal and external ear normal. Tympanic membrane is not erythematous, retracted or bulging.     Nose: No mucosal edema or rhinorrhea.     Right Sinus: No maxillary sinus tenderness or frontal sinus tenderness.     Left Sinus: No maxillary sinus tenderness or frontal sinus tenderness.     Mouth/Throat:     Pharynx: Uvula midline.  Eyes:     General: Lids are normal. Lids are everted, no foreign bodies appreciated.     Conjunctiva/sclera: Conjunctivae normal.     Pupils: Pupils are equal, round, and reactive to light.  Neck:     Thyroid: No thyroid mass or thyromegaly.     Vascular: No carotid bruit.     Trachea: Trachea normal.  Cardiovascular:     Rate and Rhythm: Normal rate and regular rhythm.     Pulses: Normal pulses.     Heart sounds: Normal heart sounds, S1 normal and S2 normal. No murmur heard.    No friction rub. No gallop.  Pulmonary:     Effort: Pulmonary effort is normal. No tachypnea or respiratory distress.     Breath sounds: Normal breath sounds. No decreased breath sounds, wheezing, rhonchi or rales.  Abdominal:     General: Bowel sounds are normal.     Palpations: Abdomen is soft.     Tenderness: There is no abdominal tenderness.  Musculoskeletal:     Cervical back: Normal, normal range of motion and neck supple.     Thoracic back: Normal.     Lumbar back: Tenderness present. No bony tenderness. Decreased range of motion. Positive left straight leg raise test. Negative right straight leg raise test.  Skin:    General: Skin is warm and dry.     Findings: No rash.  Neurological:     Mental Status: She is  alert.  Psychiatric:        Mood and Affect: Mood is not anxious or depressed.        Speech: Speech normal.        Behavior: Behavior normal. Behavior is cooperative.        Thought Content: Thought content normal.        Judgment: Judgment normal.  Results for orders placed or performed in visit on 11/28/21  Comprehensive metabolic panel  Result Value Ref Range   Sodium 135 135 - 145 mEq/L   Potassium 4.4 3.5 - 5.1 mEq/L   Chloride 101 96 - 112 mEq/L   CO2 25 19 - 32 mEq/L   Glucose, Bld 97 70 - 99 mg/dL   BUN 28 (H) 6 - 23 mg/dL   Creatinine, Ser 1.01 0.40 - 1.20 mg/dL   Total Bilirubin 0.5 0.2 - 1.2 mg/dL   Alkaline Phosphatase 45 39 - 117 U/L   AST 30 0 - 37 U/L   ALT 38 (H) 0 - 35 U/L   Total Protein 6.9 6.0 - 8.3 g/dL   Albumin 4.5 3.5 - 5.2 g/dL   GFR 58.64 (L) >60.00 mL/min   Calcium 10.0 8.4 - 10.5 mg/dL  Vitamin D, 25-hydroxy  Result Value Ref Range   VITD 82.09 30.00 - 100.00 ng/mL  CBC  Result Value Ref Range   WBC 7.6 4.0 - 10.5 K/uL   RBC 4.27 3.87 - 5.11 Mil/uL   Platelets 220.0 150.0 - 400.0 K/uL   Hemoglobin 13.7 12.0 - 15.0 g/dL   HCT 40.3 36.0 - 46.0 %   MCV 94.5 78.0 - 100.0 fl   MCHC 33.9 30.0 - 36.0 g/dL   RDW 13.0 11.5 - 15.5 %     COVID 19 screen:  No recent travel or known exposure to COVID19 The patient denies respiratory symptoms of COVID 19 at this time. The importance of social distancing was discussed today.   Assessment and Plan Problem List Items Addressed This Visit     History of back surgery   Relevant Orders   Ambulatory referral to Neurosurgery   Lumbar back pain with radiculopathy affecting left lower extremity - Primary    Acute Start prednisone taper today.  Can use tylenol ES 2 tabs 2-3 times daily  Can use zofran for nausea as needed.  Given severity of pain and focal vertebral tenderness we will evaluate with x-ray. Her history of back surgery and questionable perineal numbness I will move forward with a  neurosurgery referral:  Dr. Saintclair Halsted as requested.      Relevant Medications   predniSONE (DELTASONE) 20 MG tablet   Other Relevant Orders   DG Lumbar Spine Complete (Completed)   Ambulatory referral to Neurosurgery   Perineal numbness   Relevant Orders   DG Lumbar Spine Complete (Completed)   Ambulatory referral to Neurosurgery   Meds ordered this encounter  Medications   DISCONTD: predniSONE (DELTASONE) 20 MG tablet    Sig: 3 tabs by mouth daily x 5 days, then 2 tabs by mouth daily x 5 days then 1 tab by mouth daily x 5 days    Dispense:  15 tablet    Refill:  0   ondansetron (ZOFRAN) 4 MG tablet    Sig: Take 1 tablet (4 mg total) by mouth every 8 (eight) hours as needed for nausea or vomiting.    Dispense:  20 tablet    Refill:  0   predniSONE (DELTASONE) 20 MG tablet    Sig: 3 tabs by mouth daily x 5 days, then 2 tabs by mouth daily x 5 days then 1 tab by mouth daily x 5 days    Dispense:  30 tablet    Refill:  0   Orders Placed This Encounter  Procedures   DG Lumbar Spine Complete    Standing Status:   Future  Number of Occurrences:   1    Standing Expiration Date:   01/21/2023    Order Specific Question:   Reason for Exam (SYMPTOM  OR DIAGNOSIS REQUIRED)    Answer:   left low back pain    Order Specific Question:   Preferred imaging location?    Answer:   [facility]   Ambulatory referral to Neurosurgery    Referral Priority:   Routine    Referral Type:   Surgical    Referral Reason:   Specialty Services Required    Requested Specialty:   Neurosurgery    Number of Visits Requested:   1       Eliezer Lofts, MD

## 2022-01-23 ENCOUNTER — Telehealth: Payer: Self-pay | Admitting: Family Medicine

## 2022-01-23 DIAGNOSIS — M5416 Radiculopathy, lumbar region: Secondary | ICD-10-CM

## 2022-01-23 NOTE — Telephone Encounter (Signed)
Patient called in regards to her x-ray results. Stated she hasn't heard anything back regarding it. Thank you!

## 2022-01-23 NOTE — Addendum Note (Signed)
Addended by: [name] on: 01/23/2022 02:04 PM   Modules accepted: Orders

## 2022-01-23 NOTE — Telephone Encounter (Signed)
Referral placed.   Please get specifics for Dr. Renne Musca as unable to find him in the system - if she has a phone or fax that can update the referral coordinator

## 2022-01-23 NOTE — Telephone Encounter (Signed)
Called patient reviewed all information and repeated back to me. Will call if any questions. Pt would like a referral to Neurosurgeon  Dr. Renne Musca. Please let pt know once the referral is put in.

## 2022-01-24 NOTE — Telephone Encounter (Signed)
Sent pt a message asking if she meant Dr. Saintclair Halsted at [facility] neurosurgery and spine

## 2022-01-25 NOTE — Telephone Encounter (Signed)
Kary Kos, MD  Neurosurgery  [facility]. [address]. [place], [place] [address]. Phone number [phone]

## 2022-01-25 NOTE — Telephone Encounter (Signed)
Referral has been updated, awaiting office notes to be closed so we can send out the referral.

## 2022-01-25 NOTE — Telephone Encounter (Signed)
Update on referral

## 2022-01-30 NOTE — Assessment & Plan Note (Signed)
Acute Start prednisone taper today.  Can use tylenol ES 2 tabs 2-3 times daily  Can use zofran for nausea as needed.  Given severity of pain and focal vertebral tenderness we will evaluate with x-ray. Her history of back surgery and questionable perineal numbness I will move forward with a neurosurgery referral:  Dr. Saintclair Halsted as requested.

## 2022-02-10 ENCOUNTER — Encounter: Payer: Self-pay | Admitting: Family Medicine

## 2022-02-10 DIAGNOSIS — K52832 Lymphocytic colitis: Secondary | ICD-10-CM

## 2022-02-10 DIAGNOSIS — H2513 Age-related nuclear cataract, bilateral: Secondary | ICD-10-CM | POA: Diagnosis not present

## 2022-02-10 DIAGNOSIS — H04123 Dry eye syndrome of bilateral lacrimal glands: Secondary | ICD-10-CM | POA: Diagnosis not present

## 2022-02-13 DIAGNOSIS — H524 Presbyopia: Secondary | ICD-10-CM | POA: Diagnosis not present

## 2022-02-15 ENCOUNTER — Ambulatory Visit (INDEPENDENT_AMBULATORY_CARE_PROVIDER_SITE_OTHER): Payer: Medicare Other

## 2022-02-15 DIAGNOSIS — R55 Syncope and collapse: Secondary | ICD-10-CM | POA: Diagnosis not present

## 2022-02-16 LAB — CUP PACEART REMOTE DEVICE CHECK
Battery Remaining Longevity: 46 mo
Battery Remaining Percentage: 37 %
Battery Voltage: 2.96 V
Brady Statistic AP VP Percent: 1 %
Brady Statistic AP VS Percent: 11 %
Brady Statistic AS VP Percent: 1 %
Brady Statistic AS VS Percent: 88 %
Brady Statistic RA Percent Paced: 11 %
Brady Statistic RV Percent Paced: 1 %
Date Time Interrogation Session: 20230913031306
Implantable Lead Implant Date: 20151120
Implantable Lead Implant Date: 20151120
Implantable Lead Location: 753862
Implantable Lead Location: 753862
Implantable Pulse Generator Implant Date: 20151120
Lead Channel Impedance Value: 390 Ohm
Lead Channel Impedance Value: 660 Ohm
Lead Channel Pacing Threshold Amplitude: 0.75 V
Lead Channel Pacing Threshold Amplitude: 0.75 V
Lead Channel Pacing Threshold Pulse Width: 0.5 ms
Lead Channel Pacing Threshold Pulse Width: 0.5 ms
Lead Channel Sensing Intrinsic Amplitude: 0.7 mV
Lead Channel Sensing Intrinsic Amplitude: 6.8 mV
Lead Channel Setting Pacing Amplitude: 2 V
Lead Channel Setting Pacing Amplitude: 2 V
Lead Channel Setting Pacing Pulse Width: 0.5 ms
Lead Channel Setting Sensing Sensitivity: 0.5 mV
Pulse Gen Model: 2240
Pulse Gen Serial Number: 7689719

## 2022-02-16 NOTE — Telephone Encounter (Signed)
Referral has been faxed - see referral notes

## 2022-02-21 DIAGNOSIS — M5440 Lumbago with sciatica, unspecified side: Secondary | ICD-10-CM | POA: Diagnosis not present

## 2022-03-02 NOTE — Progress Notes (Signed)
Remote pacemaker transmission.   

## 2022-03-08 ENCOUNTER — Telehealth: Payer: Self-pay | Admitting: Family Medicine

## 2022-03-08 ENCOUNTER — Ambulatory Visit
Admission: RE | Admit: 2022-03-08 | Discharge: 2022-03-08 | Disposition: A | Discharge status: Home / Self Care | Payer: Medicare Other | Source: Ambulatory Visit | Attending: Family Medicine | Admitting: Family Medicine

## 2022-03-08 ENCOUNTER — Other Ambulatory Visit (HOSPITAL_COMMUNITY): Payer: Self-pay | Admitting: Neurosurgery

## 2022-03-08 ENCOUNTER — Other Ambulatory Visit: Payer: Self-pay | Admitting: Family Medicine

## 2022-03-08 DIAGNOSIS — M5440 Lumbago with sciatica, unspecified side: Secondary | ICD-10-CM

## 2022-03-08 DIAGNOSIS — Z1231 Encounter for screening mammogram for malignant neoplasm of breast: Secondary | ICD-10-CM | POA: Diagnosis not present

## 2022-03-08 DIAGNOSIS — F5104 Psychophysiologic insomnia: Secondary | ICD-10-CM

## 2022-03-08 NOTE — Telephone Encounter (Signed)
Pt called in stated she would like all of  her the medication refill . Did not specified what RX . Stated she has a new pt appointment set up for April of 2024 need medication until then . Please advise # [phone]

## 2022-03-09 ENCOUNTER — Telehealth: Payer: Self-pay | Admitting: Family Medicine

## 2022-03-09 NOTE — Telephone Encounter (Signed)
[name] called back in and gave fax number of [phone].

## 2022-03-09 NOTE — Telephone Encounter (Signed)
Faxed 16 page pacemaker report to [name] at [place].

## 2022-03-09 NOTE — Telephone Encounter (Signed)
Left VM for [name] asking for her fax number so I can fax her the pacemaker report we have in pt's chart.  Please get number and let me know.

## 2022-03-09 NOTE — Telephone Encounter (Signed)
Kim Pennington from [place] [facility] called about the pt from a referral. Kim Pennington asked did we have any info on the pt's pace maker to send to [employer]? Since the pt is a new pt. Call back # [phone], ext [phone].

## 2022-03-20 NOTE — Telephone Encounter (Signed)
Rx' sent in that were due. Pt will need a medication refill appt if she needs refills before her TOC appt.  Please remind pt to let her pharmacy know when she needs refills instead of calling us.

## 2022-05-16 LAB — CUP PACEART REMOTE DEVICE CHECK
Battery Remaining Longevity: 43 mo
Battery Remaining Percentage: 35 %
Battery Voltage: 2.95 V
Brady Statistic AP VP Percent: 1 %
Brady Statistic AP VS Percent: 10 %
Brady Statistic AS VP Percent: 1 %
Brady Statistic AS VS Percent: 90 %
Brady Statistic RA Percent Paced: 9.7 %
Brady Statistic RV Percent Paced: 1 %
Date Time Interrogation Session: 20231211125339
Implantable Lead Connection Status: 753985
Implantable Lead Connection Status: 753985
Implantable Lead Implant Date: 20151120
Implantable Lead Implant Date: 20151120
Implantable Lead Location: 753862
Implantable Lead Location: 753862
Implantable Pulse Generator Implant Date: 20151120
Lead Channel Impedance Value: 410 Ohm
Lead Channel Impedance Value: 600 Ohm
Lead Channel Pacing Threshold Amplitude: 0.75 V
Lead Channel Pacing Threshold Amplitude: 0.75 V
Lead Channel Pacing Threshold Pulse Width: 0.5 ms
Lead Channel Pacing Threshold Pulse Width: 0.5 ms
Lead Channel Sensing Intrinsic Amplitude: 1.4 mV
Lead Channel Sensing Intrinsic Amplitude: 6 mV
Lead Channel Setting Pacing Amplitude: 2 V
Lead Channel Setting Pacing Amplitude: 2 V
Lead Channel Setting Pacing Pulse Width: 0.5 ms
Lead Channel Setting Sensing Sensitivity: 0.5 mV
Pulse Gen Model: 2240
Pulse Gen Serial Number: 7689719

## 2022-05-17 ENCOUNTER — Ambulatory Visit (INDEPENDENT_AMBULATORY_CARE_PROVIDER_SITE_OTHER): Payer: Medicare Other

## 2022-05-17 ENCOUNTER — Encounter (HOSPITAL_COMMUNITY): Payer: Self-pay | Admitting: Emergency Medicine

## 2022-05-17 ENCOUNTER — Telehealth: Payer: Self-pay | Admitting: Cardiology

## 2022-05-17 ENCOUNTER — Observation Stay (HOSPITAL_COMMUNITY)
Admission: EM | Admit: 2022-05-17 | Discharge: 2022-05-19 | Disposition: A | Discharge status: Home / Self Care | Payer: Medicare Other | Attending: Internal Medicine | Admitting: Internal Medicine

## 2022-05-17 ENCOUNTER — Telehealth (HOSPITAL_COMMUNITY): Payer: Self-pay | Admitting: Emergency Medicine

## 2022-05-17 ENCOUNTER — Emergency Department (HOSPITAL_COMMUNITY): Payer: Medicare Other

## 2022-05-17 DIAGNOSIS — Z79899 Other long term (current) drug therapy: Secondary | ICD-10-CM | POA: Diagnosis not present

## 2022-05-17 DIAGNOSIS — I130 Hypertensive heart and chronic kidney disease with heart failure and stage 1 through stage 4 chronic kidney disease, or unspecified chronic kidney disease: Secondary | ICD-10-CM | POA: Insufficient documentation

## 2022-05-17 DIAGNOSIS — R079 Chest pain, unspecified: Secondary | ICD-10-CM | POA: Diagnosis not present

## 2022-05-17 DIAGNOSIS — Z950 Presence of cardiac pacemaker: Secondary | ICD-10-CM | POA: Insufficient documentation

## 2022-05-17 DIAGNOSIS — Z85828 Personal history of other malignant neoplasm of skin: Secondary | ICD-10-CM | POA: Diagnosis not present

## 2022-05-17 DIAGNOSIS — Z905 Acquired absence of kidney: Secondary | ICD-10-CM

## 2022-05-17 DIAGNOSIS — F419 Anxiety disorder, unspecified: Secondary | ICD-10-CM | POA: Diagnosis present

## 2022-05-17 DIAGNOSIS — Z23 Encounter for immunization: Secondary | ICD-10-CM | POA: Diagnosis not present

## 2022-05-17 DIAGNOSIS — R81 Glycosuria: Secondary | ICD-10-CM | POA: Insufficient documentation

## 2022-05-17 DIAGNOSIS — I5032 Chronic diastolic (congestive) heart failure: Secondary | ICD-10-CM | POA: Insufficient documentation

## 2022-05-17 DIAGNOSIS — N183 Chronic kidney disease, stage 3 unspecified: Secondary | ICD-10-CM | POA: Insufficient documentation

## 2022-05-17 DIAGNOSIS — I495 Sick sinus syndrome: Secondary | ICD-10-CM | POA: Diagnosis not present

## 2022-05-17 DIAGNOSIS — N1330 Unspecified hydronephrosis: Secondary | ICD-10-CM | POA: Diagnosis not present

## 2022-05-17 DIAGNOSIS — R55 Syncope and collapse: Secondary | ICD-10-CM | POA: Diagnosis not present

## 2022-05-17 DIAGNOSIS — F39 Unspecified mood [affective] disorder: Secondary | ICD-10-CM | POA: Diagnosis present

## 2022-05-17 DIAGNOSIS — I10 Essential (primary) hypertension: Secondary | ICD-10-CM | POA: Diagnosis present

## 2022-05-17 DIAGNOSIS — I480 Paroxysmal atrial fibrillation: Secondary | ICD-10-CM | POA: Diagnosis not present

## 2022-05-17 DIAGNOSIS — F4310 Post-traumatic stress disorder, unspecified: Secondary | ICD-10-CM

## 2022-05-17 LAB — URINALYSIS, ROUTINE W REFLEX MICROSCOPIC
Bacteria, UA: NONE SEEN
Bilirubin Urine: NEGATIVE
Glucose, UA: >=500 mg/dL — AB
Hgb urine dipstick: NEGATIVE
Ketones, ur: NEGATIVE mg/dL
Leukocytes,Ua: NEGATIVE
Nitrite: NEGATIVE
Protein, ur: NEGATIVE mg/dL
Specific Gravity, Urine: 1.014 (ref 1.005–1.030)
pH: 6 (ref 5.0–8.0)

## 2022-05-17 LAB — COMPREHENSIVE METABOLIC PANEL
ALT: 53 U/L — ABNORMAL HIGH (ref 0–44)
AST: 40 U/L (ref 15–41)
Albumin: 4.3 g/dL (ref 3.5–5.0)
Alkaline Phosphatase: 46 U/L (ref 38–126)
Anion gap: 11 (ref 5–15)
BUN: 17 mg/dL (ref 8–23)
CO2: 25 mmol/L (ref 22–32)
Calcium: 10 mg/dL (ref 8.9–10.3)
Chloride: 104 mmol/L (ref 98–111)
Creatinine, Ser: 1.16 mg/dL — ABNORMAL HIGH (ref 0.44–1.00)
GFR, Estimated: 52 mL/min — ABNORMAL LOW (ref >60)
Glucose, Bld: 102 mg/dL — ABNORMAL HIGH (ref 70–99)
Potassium: 4.2 mmol/L (ref 3.5–5.1)
Sodium: 140 mmol/L (ref 135–145)
Total Bilirubin: 0.5 mg/dL (ref 0.3–1.2)
Total Protein: 7.3 g/dL (ref 6.5–8.1)

## 2022-05-17 LAB — CBC WITH DIFFERENTIAL/PLATELET
Abs Immature Granulocytes: 0.02 10*3/uL (ref 0.00–0.07)
Basophils Absolute: 0.1 10*3/uL (ref 0.0–0.1)
Basophils Relative: 1 %
Eosinophils Absolute: 0.2 10*3/uL (ref 0.0–0.5)
Eosinophils Relative: 3 %
HCT: 41.7 % (ref 36.0–46.0)
Hemoglobin: 14.2 g/dL (ref 12.0–15.0)
Immature Granulocytes: 0 %
Lymphocytes Relative: 30 %
Lymphs Abs: 1.9 10*3/uL (ref 0.7–4.0)
MCH: 31.8 pg (ref 26.0–34.0)
MCHC: 34.1 g/dL (ref 30.0–36.0)
MCV: 93.5 fL (ref 80.0–100.0)
Monocytes Absolute: 0.7 10*3/uL (ref 0.1–1.0)
Monocytes Relative: 10 %
Neutro Abs: 3.5 10*3/uL (ref 1.7–7.7)
Neutrophils Relative %: 56 %
Platelets: 221 10*3/uL (ref 150–400)
RBC: 4.46 MIL/uL (ref 3.87–5.11)
RDW: 12.9 % (ref 11.5–15.5)
WBC: 6.3 10*3/uL (ref 4.0–10.5)
nRBC: 0 % (ref 0.0–0.2)

## 2022-05-17 LAB — TROPONIN I (HIGH SENSITIVITY): Troponin I (High Sensitivity): <2 ng/L (ref <18)

## 2022-05-17 LAB — CBG MONITORING, ED: Glucose-Capillary: 134 mg/dL — ABNORMAL HIGH (ref 70–99)

## 2022-05-17 NOTE — ED Provider Triage Note (Signed)
Emergency Medicine Provider Triage Evaluation Note  Kim Pennington , a 66 y.o. female  was evaluated in triage.  Pt complains of chest heaviness, pain and tingling down both arms, multiple episodes of vomiting.  She reports 2 syncopal episodes yesterday and came out of them confused as to what happened and was extremely diaphoretic.  She states she has had these symptoms before pacemaker was placed in 2015.  Also took BP at home and reports SBP in the 70s.  Denies fever, abdominal pain, cough.  Cardiology interrogated pacemaker earlier today.   Review of Systems  Positive: See above Negative: See above  Physical Exam  BP 137/79   Pulse 70   Temp 98 F (36.7 C) (Oral)   Ht '5\' 2"'$  (1.575 m)   Wt 59 kg   SpO2 97%   BMI 23.78 kg/m  Gen:   Awake, no distress   Resp:  Normal effort  MSK:   Moves extremities without difficulty  Other:  Patient ambulating in triage room without difficulty; skin is warm and dry   Medical Decision Making  Medically screening exam initiated at 6:42 PM.  Appropriate orders placed.  Kim Pennington was informed that the remainder of the evaluation will be completed by another provider, this initial triage assessment does not replace that evaluation, and the importance of remaining in the ED until their evaluation is complete.     Theressa Stamps R, [place] 05/17/22 [phone]

## 2022-05-17 NOTE — ED Provider Notes (Signed)
[facility] EMERGENCY DEPARTMENT Provider Note   CSN: [id number] Arrival date & time: 05/17/22  1717     History Chief Complaint  Patient presents with  . Chest Pain  . Loss of Consciousness    Kim Pennington is a 66 y.o. female.   Chest Pain Associated symptoms: fatigue and syncope   Associated symptoms: no fever, no headache and no palpitations   Loss of Consciousness Associated symptoms: chest pain   Associated symptoms: no fever, no headaches and no palpitations   Patient presented to the ER following multiple syncopal episodes with associated chest pressure. She states that she had two witnessed syncopal episodes at home without specific trigger in the last few days and was advised by her cardiologist to come into the ER for further evaluation. She states that chest pain feels like something is "sitting on [her]" but does not feel that it is painful and not currently experiencing radiation into arms or neck. Patient has a history of right sided nephrectomy and currently CKD stage 3 with close management with nephrology.      Home Medications Prior to Admission medications   Medication Sig Start Date End Date Taking? Authorizing Provider  acyclovir (ZOVIRAX) 400 MG tablet Take 1 tablet (400 mg total) by mouth 2 (two) times daily. 12/16/21   Waunita Schooner, MD  alendronate (FOSAMAX) 70 MG tablet TAKE 1 TABLET(70 MG) BY MOUTH 1 TIME A WEEK 01/05/22   Waunita Schooner, MD  ALPRAZolam Duanne Moron) 1 MG tablet TAKE 1 TABLET(1 MG) BY MOUTH AT BEDTIME AS NEEDED 03/10/22   Waunita Schooner, MD  amLODipine (NORVASC) 10 MG tablet Take 1 tablet (10 mg total) by mouth daily. 08/23/21   Waunita Schooner, MD  atorvastatin (LIPITOR) 80 MG tablet TAKE 1 TABLET(80 MG) BY MOUTH DAILY 11/22/21   Waunita Schooner, MD  Calcium Carbonate-Vit D-Min (CALCIUM 1200 PO) Take 1 tablet by mouth daily.    [provider]  CRANBERRY-CALCIUM PO Take 1 tablet by mouth daily.    [provider]   dicyclomine (BENTYL) 10 MG capsule TAKE 1 CAPSULE(10 MG) BY MOUTH 2 TO 3 TIMES A DAY AS NEEDED FOR ABDOMINAL CRAMPS OR DIARRHEA 11/17/21   Doran Stabler, MD  estradiol (ESTRACE) 0.5 MG tablet TAKE 1 TABLET BY MOUTH DAILY 12/19/21   Waunita Schooner, MD  losartan (COZAAR) 25 MG tablet TAKE 1 TABLET(25 MG) BY MOUTH DAILY 01/05/22   Waunita Schooner, MD  montelukast (SINGULAIR) 10 MG tablet TAKE 1 TABLET BY MOUTH DAILY AS NEEDED 03/10/22   Waunita Schooner, MD  ondansetron (ZOFRAN) 4 MG tablet Take 1 tablet (4 mg total) by mouth every 8 (eight) hours as needed for nausea or vomiting. 01/20/22   Bedsole, Amy E, MD  prazosin (MINIPRESS) 1 MG capsule TAKE 1 TO 2 CAPSULES(1 TO 2 MG) BY MOUTH AT BEDTIME 11/22/21   Waunita Schooner, MD  predniSONE (DELTASONE) 20 MG tablet 3 tabs by mouth daily x 5 days, then 2 tabs by mouth daily x 5 days then 1 tab by mouth daily x 5 days 01/20/22   Jinny Sanders, MD  vitamin B-12 (CYANOCOBALAMIN) 1000 MCG tablet Take 1 tablet (1,000 mcg total) by mouth daily. 06/29/20   Waunita Schooner, MD      Allergies    Tape    Review of Systems   Review of Systems  Constitutional:  Positive for fatigue. Negative for chills and fever.  Respiratory:  Positive for chest tightness. Negative for choking.   Cardiovascular:  Positive for chest pain and syncope. Negative for palpitations.  Neurological:  Positive for syncope and light-headedness. Negative for headaches.  All other systems reviewed and are negative.   Physical Exam Updated Vital Signs BP 119/66   Pulse 64   Temp 98 F (36.7 C) (Oral)   Resp 12   Ht '5\' 2"'$  (1.575 m)   Wt 59 kg   SpO2 97%   BMI 23.78 kg/m  Physical Exam Vitals and nursing note reviewed.  Constitutional:      Appearance: She is well-developed.  Neurological:     Mental Status: She is alert.     ED Results / Procedures / Treatments   Labs (all labs ordered are listed, but only abnormal results are displayed) Labs Reviewed  URINALYSIS, ROUTINE W  REFLEX MICROSCOPIC - Abnormal; Notable for the following components:      Result Value   APPearance HAZY (*)    Glucose, UA >=500 (*)    All other components within normal limits  COMPREHENSIVE METABOLIC PANEL - Abnormal; Notable for the following components:   Glucose, Bld 102 (*)    Creatinine, Ser 1.16 (*)    ALT 53 (*)    GFR, Estimated 52 (*)    All other components within normal limits  CBG MONITORING, ED - Abnormal; Notable for the following components:   Glucose-Capillary 134 (*)    All other components within normal limits  CBC WITH DIFFERENTIAL/PLATELET  TROPONIN I (HIGH SENSITIVITY)  TROPONIN I (HIGH SENSITIVITY)    EKG None  Radiology DG Chest 2 View  Result Date: 05/17/2022 CLINICAL DATA:  Chest pain EXAM: CHEST - 2 VIEW COMPARISON:  05/19/2020 FINDINGS: There is interval increase in transverse diameter of heart. There are no signs of pulmonary edema or focal pulmonary consolidation. There is no pleural effusion or pneumothorax. Pacemaker battery is seen in left infraclavicular region. No significant interval changes are noted in the lung fields. IMPRESSION: There are no focal infiltrates or signs of pulmonary edema. Electronically Signed   By: Elmer Picker M.D.   On: 05/17/2022 19:26    Procedures Procedures   Medications Ordered in ED Medications - No data to display  ED Course/ Medical Decision Making/ A&P Clinical Course as of 05/17/22 2349  Wed May 17, 2022  2231 Follows w [facility] History today was obtained from the patient.  She is recently moved here from [facility] with her husband to be closer to their daughter.  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  She tells me that she has a history of syncope for which she had a loop recorder implanted around 2015.  Apparently very soon after having the loop recorder implanted there is an abnormality which prompted a permanent pacemaker to be placed.  She tells me that after  the permanent pacemaker was implanted in 2015 she has not had any recurrent syncopal episodes. [WF]  2328 No issues on interrogation  [WF]    Clinical Course User Index [WF] Tedd Sias, [place]                           Medical Decision Making  ***  {Document critical care time when appropriate:1} {Document review of labs and clinical decision tools ie heart score, Chads2Vasc2 etc:1}  {Document your independent review of radiology images, and any outside records:1} {Document your discussion with family members, caretakers, and with consultants:1} {Document social determinants of health affecting pt's care:1} {  Document your decision making why or why not admission, treatments were needed:1} Final Clinical Impression(s) / ED Diagnoses Final diagnoses:  None    Rx / DC Orders ED Discharge Orders     None

## 2022-05-17 NOTE — ED Notes (Signed)
Pts pacemaker interrogated  

## 2022-05-17 NOTE — ED Notes (Signed)
Per EDP Zelaya, pt will not need 2nd trop at this time.

## 2022-05-17 NOTE — ED Triage Notes (Signed)
Pt reports chest heaviness and pain down both arms. Also passed out twice yesterday. States she had these symptoms before pacemaker placed in 2015.

## 2022-05-17 NOTE — Telephone Encounter (Signed)
Pt called c/o feeling lightheaded, dizzy, and tingling in shoulder and arm. Pt stated she just has this weird feeling, and every time she stands she feels like she's going to pass out.  Pt reported last night she passed out, but prior to episode BP was 70/50. Pt current BP 121/73, however pt reported she just walked upstairs to get BP machine.  Pt also reports she is currently experiencing above symptoms.  Pt stated she did take her morning amlodipine and losartan   Based on current symptoms, nurse attempted to encourage pt to report to ER for further evaluations. Pt declined several times and stated she has to care for her husband.  Dr. Saunders Revel (DOD) made aware and also recommended pt report to ER. He also stated if pt continue to refuse, hold all BP medication and contact device clinic for interrogation.   Pt made aware of MD's recommendation and verbalized understanding.  Will forward to Dr. [name] and device clinic

## 2022-05-17 NOTE — Telephone Encounter (Signed)
Pt c/o BP issue: STAT if pt c/o blurred vision, one-sided weakness or slurred speech  1. What are your last 5 BP readings?  70/50 -Last night before passing out  2. Are you having any other symptoms (ex. Dizziness, headache, blurred vision, passed out)? Dizziness, headaches, passed out (last night), tingling in shoulders and arms  3. What is your BP issue? Pt states that BP has been running low since yesterday. Pt would like a callback regarding this matter.

## 2022-05-17 NOTE — Telephone Encounter (Signed)
No obvious cause for dizziness/hypotension noted on device interrogation.  Of note, interrogation notes 3 AMS episodes as well at AT/AF (burden less than 1%) though I don't see a history of atrial fibrillation in the patient's chart.  I will defer to Dr. [name] regarding role for anticoagulation or other management of AF.  As previously noted, I recommend that Kim Pennington go to the nearest ED for further evaluation if she continues to be dizzy and/or hypotensive.  If she again declines, she should reach out to Dr. Verda Cumins office for further evaluation as soon as possible.  [name], MD [facility]

## 2022-05-17 NOTE — Telephone Encounter (Signed)
Pt called to report her daughter is currently taking her to the ER for further evaluations as she started to experiencing chest heaviness and left arm numbness and tinging in addition to her other symptoms. Nurse informed pt she is making the right decision by seeking emergent care.

## 2022-05-17 NOTE — Telephone Encounter (Signed)
Remote transmission received and reviewed. Remote shows normal device function, AT/AF burden <1%, 3 AMS events logged but no EGM's stored. No alerts triggered. Presenting shows AS/VS 74 bpm.   Routing to advise interrogation results.

## 2022-05-18 ENCOUNTER — Observation Stay (HOSPITAL_BASED_OUTPATIENT_CLINIC_OR_DEPARTMENT_OTHER): Payer: Medicare Other

## 2022-05-18 ENCOUNTER — Emergency Department (HOSPITAL_COMMUNITY): Payer: Medicare Other

## 2022-05-18 ENCOUNTER — Encounter (HOSPITAL_COMMUNITY): Payer: Self-pay | Admitting: Internal Medicine

## 2022-05-18 ENCOUNTER — Telehealth: Payer: Self-pay | Admitting: Cardiology

## 2022-05-18 DIAGNOSIS — I5032 Chronic diastolic (congestive) heart failure: Secondary | ICD-10-CM

## 2022-05-18 DIAGNOSIS — I10 Essential (primary) hypertension: Secondary | ICD-10-CM

## 2022-05-18 DIAGNOSIS — N1330 Unspecified hydronephrosis: Secondary | ICD-10-CM | POA: Diagnosis not present

## 2022-05-18 DIAGNOSIS — I480 Paroxysmal atrial fibrillation: Secondary | ICD-10-CM | POA: Diagnosis not present

## 2022-05-18 DIAGNOSIS — R9431 Abnormal electrocardiogram [ECG] [EKG]: Secondary | ICD-10-CM

## 2022-05-18 DIAGNOSIS — Z905 Acquired absence of kidney: Secondary | ICD-10-CM

## 2022-05-18 DIAGNOSIS — I959 Hypotension, unspecified: Secondary | ICD-10-CM | POA: Diagnosis not present

## 2022-05-18 DIAGNOSIS — R55 Syncope and collapse: Secondary | ICD-10-CM | POA: Diagnosis not present

## 2022-05-18 DIAGNOSIS — F39 Unspecified mood [affective] disorder: Secondary | ICD-10-CM | POA: Diagnosis present

## 2022-05-18 DIAGNOSIS — F419 Anxiety disorder, unspecified: Secondary | ICD-10-CM

## 2022-05-18 DIAGNOSIS — N1831 Chronic kidney disease, stage 3a: Secondary | ICD-10-CM

## 2022-05-18 DIAGNOSIS — R81 Glycosuria: Secondary | ICD-10-CM | POA: Diagnosis present

## 2022-05-18 DIAGNOSIS — I495 Sick sinus syndrome: Secondary | ICD-10-CM

## 2022-05-18 LAB — ECHOCARDIOGRAM COMPLETE
Area-P 1/2: 3.43 cm2
Height: 62 in
S' Lateral: 3 cm
Weight: 2080 oz

## 2022-05-18 LAB — COMPREHENSIVE METABOLIC PANEL
ALT: 48 U/L — ABNORMAL HIGH (ref 0–44)
AST: 33 U/L (ref 15–41)
Albumin: 3.8 g/dL (ref 3.5–5.0)
Alkaline Phosphatase: 40 U/L (ref 38–126)
Anion gap: 10 (ref 5–15)
BUN: 15 mg/dL (ref 8–23)
CO2: 23 mmol/L (ref 22–32)
Calcium: 9.2 mg/dL (ref 8.9–10.3)
Chloride: 106 mmol/L (ref 98–111)
Creatinine, Ser: 1.05 mg/dL — ABNORMAL HIGH (ref 0.44–1.00)
GFR, Estimated: 59 mL/min — ABNORMAL LOW (ref >60)
Glucose, Bld: 91 mg/dL (ref 70–99)
Potassium: 3.7 mmol/L (ref 3.5–5.1)
Sodium: 139 mmol/L (ref 135–145)
Total Bilirubin: 0.8 mg/dL (ref 0.3–1.2)
Total Protein: 6.2 g/dL — ABNORMAL LOW (ref 6.5–8.1)

## 2022-05-18 LAB — HEMOGLOBIN A1C
Hgb A1c MFr Bld: 5.7 % — ABNORMAL HIGH (ref 4.8–5.6)
Mean Plasma Glucose: 117 mg/dL

## 2022-05-18 LAB — CBC WITH DIFFERENTIAL/PLATELET
Abs Immature Granulocytes: 0.01 10*3/uL (ref 0.00–0.07)
Basophils Absolute: 0.1 10*3/uL (ref 0.0–0.1)
Basophils Relative: 1 %
Eosinophils Absolute: 0.3 10*3/uL (ref 0.0–0.5)
Eosinophils Relative: 6 %
HCT: 39.6 % (ref 36.0–46.0)
Hemoglobin: 13.3 g/dL (ref 12.0–15.0)
Immature Granulocytes: 0 %
Lymphocytes Relative: 43 %
Lymphs Abs: 2.2 10*3/uL (ref 0.7–4.0)
MCH: 31.4 pg (ref 26.0–34.0)
MCHC: 33.6 g/dL (ref 30.0–36.0)
MCV: 93.6 fL (ref 80.0–100.0)
Monocytes Absolute: 0.6 10*3/uL (ref 0.1–1.0)
Monocytes Relative: 11 %
Neutro Abs: 2 10*3/uL (ref 1.7–7.7)
Neutrophils Relative %: 39 %
Platelets: 201 10*3/uL (ref 150–400)
RBC: 4.23 MIL/uL (ref 3.87–5.11)
RDW: 13 % (ref 11.5–15.5)
WBC: 5 10*3/uL (ref 4.0–10.5)
nRBC: 0 % (ref 0.0–0.2)

## 2022-05-18 LAB — TROPONIN I (HIGH SENSITIVITY)
Troponin I (High Sensitivity): 3 ng/L (ref <18)
Troponin I (High Sensitivity): 3 ng/L (ref <18)

## 2022-05-18 LAB — TSH: TSH: 1.141 u[IU]/mL (ref 0.350–4.500)

## 2022-05-18 LAB — MAGNESIUM: Magnesium: 2.1 mg/dL (ref 1.7–2.4)

## 2022-05-18 MED ORDER — IOHEXOL 350 MG/ML SOLN
75.0000 mL | Freq: Once | INTRAVENOUS | Status: AC | PRN
  Administered 2022-05-18: 75 mL via INTRAVENOUS

## 2022-05-18 MED ORDER — AMLODIPINE BESYLATE 5 MG PO TABS
5.0000 mg | ORAL_TABLET | Freq: Every day | ORAL | Status: DC
  Administered 2022-05-18: 5 mg via ORAL
  Filled 2022-05-18: qty 1

## 2022-05-18 MED ORDER — MELATONIN 3 MG PO TABS: 3.0000 mg | ORAL_TABLET | Freq: Every evening | ORAL | Status: DC | PRN

## 2022-05-18 MED ORDER — ENOXAPARIN SODIUM 40 MG/0.4ML IJ SOSY
40.0000 mg | PREFILLED_SYRINGE | INTRAMUSCULAR | Status: DC
  Administered 2022-05-18 – 2022-05-19 (×2): 40 mg via SUBCUTANEOUS
  Filled 2022-05-18 (×2): qty 0.4

## 2022-05-18 MED ORDER — ACYCLOVIR 400 MG PO TABS
400.0000 mg | ORAL_TABLET | Freq: Two times a day (BID) | ORAL | Status: DC
  Administered 2022-05-18 – 2022-05-19 (×2): 400 mg via ORAL
  Filled 2022-05-18 (×3): qty 1

## 2022-05-18 MED ORDER — PRAZOSIN HCL 1 MG PO CAPS
1.0000 mg | ORAL_CAPSULE | Freq: Every day | ORAL | Status: DC
  Administered 2022-05-18: 1 mg via ORAL
  Filled 2022-05-18 (×2): qty 1

## 2022-05-18 MED ORDER — ACETAMINOPHEN 650 MG RE SUPP: 650.0000 mg | Freq: Four times a day (QID) | RECTAL | Status: DC | PRN

## 2022-05-18 MED ORDER — ACETAMINOPHEN 325 MG PO TABS: 650.0000 mg | ORAL_TABLET | Freq: Four times a day (QID) | ORAL | Status: DC | PRN

## 2022-05-18 MED ORDER — LOSARTAN POTASSIUM 25 MG PO TABS
25.0000 mg | ORAL_TABLET | Freq: Every day | ORAL | Status: DC
  Filled 2022-05-18 (×2): qty 1

## 2022-05-18 MED ORDER — SODIUM CHLORIDE 0.9% FLUSH
3.0000 mL | Freq: Two times a day (BID) | INTRAVENOUS | Status: DC
  Administered 2022-05-18: 3 mL via INTRAVENOUS

## 2022-05-18 MED ORDER — ONDANSETRON HCL 4 MG/2ML IJ SOLN
4.0000 mg | Freq: Four times a day (QID) | INTRAMUSCULAR | Status: DC | PRN
  Administered 2022-05-18 – 2022-05-19 (×2): 4 mg via INTRAVENOUS
  Filled 2022-05-18 (×2): qty 2

## 2022-05-18 MED ORDER — SODIUM CHLORIDE 0.9 % IV SOLN
12.5000 mg | Freq: Four times a day (QID) | INTRAVENOUS | Status: DC | PRN
  Administered 2022-05-18: 12.5 mg via INTRAVENOUS
  Filled 2022-05-18 (×2): qty 0.5

## 2022-05-18 MED ORDER — ATORVASTATIN CALCIUM 80 MG PO TABS
80.0000 mg | ORAL_TABLET | Freq: Every day | ORAL | Status: DC
  Administered 2022-05-18 – 2022-05-19 (×2): 80 mg via ORAL
  Filled 2022-05-18: qty 2
  Filled 2022-05-18: qty 1

## 2022-05-18 MED ORDER — ALPRAZOLAM 0.5 MG PO TABS: 1.0000 mg | ORAL_TABLET | Freq: Every day | ORAL | Status: DC | PRN

## 2022-05-18 MED ORDER — SODIUM CHLORIDE 0.9 % IV SOLN: INTRAVENOUS | Status: AC

## 2022-05-18 NOTE — ED Notes (Signed)
Provider at bedside at this time

## 2022-05-18 NOTE — Consult Note (Signed)
Cardiology Consultation:   Patient ID: Rashad Obeid; [id number]; Nov 26, 1955   Admit date: 05/17/2022 Date of Consult: 05/18/2022  Primary Care Provider: No primary care provider on file. Primary Cardiologist: Vickie Epley, MD     Patient Profile:   Kim Pennington is a 66 y.o. female with a hx of syncope and PPM who is being seen today for the evaluation of syncope at the request of No primary care provider on file.Marland Kitchen  History of Present Illness:   Ms. Strawder has a history of syncope that went away after pacemaker placement in [facility] in 2015.  She had been ok up until about 3 days ago.  She have have a sense of diaphoresis and an odd sensation and did report multiple episodes of syncope.  She called our office and I do see that her device was interrogated remotely on 12/11.  There was a note from Dr. Saunders Revel and from Dr. [name] commenting that there were no arrhythmias or device malfunction that would explain any episodes of AMS.  There were no sustained episodes of atrial fib and no suggestion for an indication for anticoagulation.    She had another episode of syncope for which she came to the ED during which she said that she felt strange and had one of these episodes and she took her BP while standing and her BP was 70 systolic.  She walked in to tell her husband and had frank syncope.  She has been having chest pain at the left upper chest where her pacemaker is.  She reports this for 3 days but also mentions that she has had this perhaps more.  She is able to be active without bringing on discomfort and reports 2 hours of weight lifting daily and aerobic exercise as well.    In the ED cardiac enzymes were negative.  There was no evidence of PE on CT.   Echo demonstrates NL LV function with no significant valvular abnormalities.  She was not orthostatic.     She otherwise says she has been feeling OK without acute stress.  She cares for her husband who requires lots of manual  care.   Past Medical History:  Diagnosis Date   Acquired solitary kidney    right nephrectomy   Anemia    Anxiety    Arthritis    C. difficile diarrhea    Depression    Hepatitis B    Hyperlipidemia    Hypertension    Kidney disease, chronic, stage III (GFR 30-59 ml/min) (HCC)    Kidney stones    Lymphocytic colitis    Memory loss    Osteoporosis    Pacemaker    Renal failure, chronic, stage 3 (moderate) ([place]) 2015   Left kidney    Skin cancer     Past Surgical History:  Procedure Laterality Date   ABDOMINAL HYSTERECTOMY  11/2006   ABDOMINOPLASTY     BREAST LUMPECTOMY Right 1979   CARDIAC CATHETERIZATION  04/24/2014   CARPAL TUNNEL RELEASE Bilateral 03/29/2009   CESAREAN SECTION     x4   COLONOSCOPY     FOOT SURGERY Bilateral    HIP ARTHROSCOPY Right 09/14/2016   reduction of lesser trochanter    HIP ARTHROSCOPY Left 04/25/2016   w/ femoroplasty   left thumb tendon  04/25/2016   LIPOSUCTION     LUMBAR LAMINECTOMY  11/23/2019   foraminotomy, discectomy, facectomy   NEPHRECTOMY Right 2009   PACEMAKER IMPLANT  04/24/2014  RHINOPLASTY     x 2, 1999, 2003   ROTATOR CUFF REPAIR Right    SHOULDER ARTHROSCOPY Right 12/30/2018   x2   TONSILLECTOMY  5102   UMBILICAL HERNIA REPAIR  1998   UPPER GASTROINTESTINAL ENDOSCOPY     WEIL OSTEOTOMY Bilateral 11/27/2019     Home Medications:  Prior to Admission medications   Medication Sig Start Date End Date Taking? Authorizing Provider  acyclovir (ZOVIRAX) 400 MG tablet Take 1 tablet (400 mg total) by mouth 2 (two) times daily. 12/16/21  Yes Waunita Schooner, MD  alendronate (FOSAMAX) 70 MG tablet TAKE 1 TABLET(70 MG) BY MOUTH 1 TIME A WEEK Patient taking differently: Take 70 mg by mouth once a week. 01/05/22  Yes Waunita Schooner, MD  ALPRAZolam Duanne Moron) 1 MG tablet TAKE 1 TABLET(1 MG) BY MOUTH AT BEDTIME AS NEEDED Patient taking differently: Take 1 mg by mouth daily as needed for anxiety. 03/10/22  Yes Waunita Schooner, MD   amLODipine (NORVASC) 10 MG tablet Take 1 tablet (10 mg total) by mouth daily. 08/23/21  Yes Waunita Schooner, MD  atorvastatin (LIPITOR) 80 MG tablet TAKE 1 TABLET(80 MG) BY MOUTH DAILY Patient taking differently: Take 80 mg by mouth daily. 11/22/21  Yes Waunita Schooner, MD  Calcium Carbonate-Vit D-Min (CALCIUM 1200 PO) Take 1 tablet by mouth daily.   Yes [provider]  CRANBERRY-CALCIUM PO Take 1 tablet by mouth daily.   Yes [provider]  estradiol (ESTRACE) 0.5 MG tablet TAKE 1 TABLET BY MOUTH DAILY 12/19/21  Yes Waunita Schooner, MD  losartan (COZAAR) 25 MG tablet TAKE 1 TABLET(25 MG) BY MOUTH DAILY Patient taking differently: Take 25 mg by mouth daily. 01/05/22  Yes Waunita Schooner, MD  montelukast (SINGULAIR) 10 MG tablet TAKE 1 TABLET BY MOUTH DAILY AS NEEDED Patient taking differently: Take 10 mg by mouth daily as needed (breathing). 03/10/22  Yes Waunita Schooner, MD  ondansetron (ZOFRAN) 4 MG tablet Take 1 tablet (4 mg total) by mouth every 8 (eight) hours as needed for nausea or vomiting. 01/20/22  Yes Bedsole, Amy E, MD  OVER THE COUNTER MEDICATION Take 3 tablets by mouth daily. Plesus, 1 tab am and 2 in pm   Yes [provider]  prazosin (MINIPRESS) 1 MG capsule TAKE 1 TO 2 CAPSULES(1 TO 2 MG) BY MOUTH AT BEDTIME Patient taking differently: Take 1 mg by mouth at bedtime. 11/22/21  Yes Waunita Schooner, MD  vitamin B-12 (CYANOCOBALAMIN) 1000 MCG tablet Take 1 tablet (1,000 mcg total) by mouth daily. 06/29/20  Yes Waunita Schooner, MD  dicyclomine (BENTYL) 10 MG capsule TAKE 1 CAPSULE(10 MG) BY MOUTH 2 TO 3 TIMES A DAY AS NEEDED FOR ABDOMINAL CRAMPS OR DIARRHEA Patient not taking: Reported on 05/18/2022 11/17/21   Doran Stabler, MD  predniSONE (DELTASONE) 20 MG tablet 3 tabs by mouth daily x 5 days, then 2 tabs by mouth daily x 5 days then 1 tab by mouth daily x 5 days Patient not taking: Reported on 05/18/2022 01/20/22   Jinny Sanders, MD    Inpatient Medications: Scheduled  Meds:  acyclovir  400 mg Oral BID   atorvastatin  80 mg Oral Daily   enoxaparin (LOVENOX) injection  40 mg Subcutaneous Q24H   losartan  25 mg Oral Daily   prazosin  1 mg Oral QHS   sodium chloride flush  3 mL Intravenous Q12H   Continuous Infusions:  promethazine (PHENERGAN) injection (IM or IVPB)     PRN Meds: acetaminophen **OR**  acetaminophen, ALPRAZolam, melatonin, ondansetron (ZOFRAN) IV, promethazine (PHENERGAN) injection (IM or IVPB)  Allergies:    Allergies  Allergen Reactions   Tape Hives    Tape adhesive long period of time     Social History:   Social History   Socioeconomic History   Marital status: Married    Spouse name: Legrand Como    Number of children: 4   Years of education: some college   Highest education level: Not on file  Occupational History   Occupation: retired Event organiser  Tobacco Use   Smoking status: Never   Smokeless tobacco: Never  Vaping Use   Vaping Use: Never used  Substance and Sexual Activity   Alcohol use: Never   Drug use: Never   Sexual activity: Not Currently    Birth control/protection: Post-menopausal, Surgical  Other Topics Concern   Not on file  Social History Narrative   06/29/20   From: [place], lived in [place] some, all her children live in [place] 2021   Living: with husband, Legrand Como [phone])   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      Family: 4 adult children - Alroy Dust, [place], Weldon Picking and Maryruth Hancock - 42 grandchildren (has 80 adult step children)      Enjoys: work out, spend time with family, hand work, music      Exercise: foot pain limits - typically weight lifts 2 hours a day   Diet: good, not as good since Production manager belts: Yes    Guns: Yes  and secure   Safe in relationships: Yes    Social Determinants of Health   Financial Resource Strain: Not on file  Food Insecurity: Not on file  Transportation Needs: Not on file  Physical Activity: Not on file  Stress: Not  on file  Social Connections: Not on file  Intimate Partner Violence: Not on file    Family History:    Family History  Problem Relation Age of Onset   Diabetes Mother    Parkinson's disease Mother    Alzheimer's disease Mother    Heart disease Father    Alzheimer's disease Father    Hypertension Father    CVA Father    Rheum arthritis Father    Stomach cancer Sister    Hyperlipidemia Sister    Hypertension Sister    Diabetes Sister    Liver disease Sister    Drug abuse Sister    Obesity Sister    Hyperlipidemia Sister    Hypertension Sister    Diabetes Sister    Heart disease Sister    Obesity Sister    Diabetes Sister    Breast cancer Sister 31       x2   Hyperlipidemia Sister    Hypertension Sister    Colon polyps Sister    Obesity Sister    Hyperlipidemia Sister    Hypertension Sister    Obesity Sister    Hyperlipidemia Brother    Hypertension Brother    Heart disease Brother    Obesity Brother    Diabetes Maternal Aunt    Diabetes Maternal Grandmother    Heart attack Paternal Grandfather    Osteoarthritis Daughter    Obesity Son    Colon cancer Neg Hx    Esophageal cancer Neg Hx      ROS:  Please see the history of present illness.  ROS  All other ROS reviewed and negative.  Physical Exam/Data:   Vitals:   05/18/22 1300 05/18/22 1335 05/18/22 1401 05/18/22 1600  BP: 113/69 105/61 (!) 100/59 120/70  Pulse: 70 72 74 72  Resp:   16   Temp:      TempSrc:      SpO2: 96% 95% 96% 98%  Weight:      Height:       No intake or output data in the 24 hours ending 05/18/22 1710 Filed Weights   05/17/22 1837  Weight: 59 kg   Body mass index is 23.78 kg/m.  GENERAL:  Wellappearing HEENT:   Pupils equal round and reactive, fundi not visualized, oral mucosa unremarkable NECK:  No  jugular venous distention, waveform within normal limits, carotid upstroke brisk and symmetric, no bruits, no thyromegaly LYMPHATICS:  No cervical, inguinal  adenopathy LUNGS:   Clear to auscultation bilaterally BACK:  No CVA tenderness CHEST:   Unremarkable HEART:  PMI not displaced or sustained,S1 and S2 within normal limits, no S3, no S4, no clicks, no rubs, no murmurs ABD:  Flat, positive bowel sounds normal in frequency in pitch, no bruits, no rebound, no guarding, no midline pulsatile mass, no hepatomegaly, no splenomegaly EXT:  2 plus pulses throughout, no  edema, no cyanosis no clubbing SKIN:  No rashes no nodules NEURO:   Cranial nerves II through XII grossly intact, motor grossly intact throughout PSYCH:    Cognitively intact, oriented to person place and time   EKG:  The EKG was personally reviewed and demonstrates:  NSR, no acute ST T wave changes Telemetry:  Telemetry was personally reviewed and demonstrates:    Relevant CV Studies: See echo above.   Laboratory Data:  Chemistry Recent Labs  Lab 05/17/22 1848 05/18/22 0324  NA 140 139  K 4.2 3.7  CL 104 106  CO2 25 23  GLUCOSE 102* 91  BUN 17 15  CREATININE 1.16* 1.05*  CALCIUM 10.0 9.2  GFRNONAA 52* 59*  ANIONGAP 11 10    Recent Labs  Lab 05/17/22 1848 05/18/22 0324  PROT 7.3 6.2*  ALBUMIN 4.3 3.8  AST 40 33  ALT 53* 48*  ALKPHOS 46 40  BILITOT 0.5 0.8   Hematology Recent Labs  Lab 05/17/22 1848 05/18/22 0324  WBC 6.3 5.0  RBC 4.46 4.23  HGB 14.2 13.3  HCT 41.7 39.6  MCV 93.5 93.6  MCH 31.8 31.4  MCHC 34.1 33.6  RDW 12.9 13.0  PLT 221 201   Cardiac EnzymesNo results for input(s): "TROPONINI" in the last 168 hours. No results for input(s): "TROPIPOC" in the last 168 hours.  BNPNo results for input(s): "BNP", "PROBNP" in the last 168 hours.  DDimer No results for input(s): "DDIMER" in the last 168 hours.  Radiology/Studies:  ECHOCARDIOGRAM COMPLETE  Result Date: 05/18/2022    ECHOCARDIOGRAM REPORT   Patient Name:   Kim Pennington Date of Exam: 05/18/2022 Medical Rec #:  [id number]      Height:       62.0 in Accession #:    [id number]      Weight:       130.0 lb Date of Birth:  1955/06/06      BSA:          1.592 m Patient Age:    66 years       BP:           129/92 mmHg Patient Gender: F              HR:  64 bpm. Exam Location:  Inpatient Procedure: 2D Echo, 3D Echo, Cardiac Doppler, Color Doppler and Strain Analysis Indications:    Abnormal ECG R94.31  History:        Patient has prior history of Echocardiogram examinations, most                 recent 08/11/2020. Pacemaker, Signs/Symptoms:Syncope and Chest                 Pain; Risk Factors:Hypertension, Non-Smoker and Dyslipidemia.  Sonographer:    Greer Pickerel Referring Phys: [id number] [facility] K[facility]AN  Sonographer Comments: Global longitudinal strain was attempted. IMPRESSIONS  1. Left ventricular ejection fraction, by estimation, is 60 to 65%. Left ventricular ejection fraction by 3D volume is 61 %. The left ventricle has normal function. The left ventricle has no regional wall motion abnormalities. Left ventricular diastolic  parameters were normal. The average left ventricular global longitudinal strain is -18.9 %. The global longitudinal strain is normal.  2. Right ventricular systolic function is normal. The right ventricular size is normal. There is normal pulmonary artery systolic pressure. The estimated right ventricular systolic pressure is 99.3 mmHg.  3. Left atrial size was mildly dilated.  4. The mitral valve is normal in structure. Trivial mitral valve regurgitation. No evidence of mitral stenosis.  5. The aortic valve is normal in structure. Aortic valve regurgitation is not visualized. No aortic stenosis is present.  6. The inferior vena cava is dilated in size with <50% respiratory variability, suggesting right atrial pressure of 15 mmHg. FINDINGS  Left Ventricle: Left ventricular ejection fraction, by estimation, is 60 to 65%. Left ventricular ejection fraction by 3D volume is 61 %. The left ventricle has normal function. The left ventricle has no regional wall motion  abnormalities. The average left ventricular global longitudinal strain is -18.9 %. The global longitudinal strain is normal. The left ventricular internal cavity size was normal in size. There is no left ventricular hypertrophy. Left ventricular diastolic parameters were normal. Normal left ventricular filling pressure. Right Ventricle: The right ventricular size is normal. No increase in right ventricular wall thickness. Right ventricular systolic function is normal. There is normal pulmonary artery systolic pressure. The tricuspid regurgitant velocity is 2.20 m/s, and  with an assumed right atrial pressure of 15 mmHg, the estimated right ventricular systolic pressure is 71.6 mmHg. Left Atrium: Left atrial size was mildly dilated. Right Atrium: Right atrial size was normal in size. Pericardium: Trivial pericardial effusion is present. The pericardial effusion is anterior to the right ventricle. Presence of epicardial fat layer. Mitral Valve: The mitral valve is normal in structure. Trivial mitral valve regurgitation. No evidence of mitral valve stenosis. Tricuspid Valve: The tricuspid valve is normal in structure. Tricuspid valve regurgitation is trivial. No evidence of tricuspid stenosis. Aortic Valve: The aortic valve is normal in structure. Aortic valve regurgitation is not visualized. No aortic stenosis is present. Pulmonic Valve: The pulmonic valve was normal in structure. Pulmonic valve regurgitation is trivial. No evidence of pulmonic stenosis. Aorta: The aortic root is normal in size and structure. Venous: The inferior vena cava is dilated in size with less than 50% respiratory variability, suggesting right atrial pressure of 15 mmHg. IAS/Shunts: No atrial level shunt detected by color flow Doppler.  LEFT VENTRICLE PLAX 2D LVIDd:         4.20 cm         Diastology LVIDs:         3.00 cm  LV e' medial:    8.49 cm/s LV PW:         0.90 cm         LV E/e' medial:  9.6 LV IVS:        0.70 cm         LV  e' lateral:   10.00 cm/s LVOT diam:     1.90 cm         LV E/e' lateral: 8.2 LV SV:         57 LV SV Index:   36              2D LVOT Area:     2.84 cm        Longitudinal                                Strain                                2D Strain GLS  -20.9 %                                (A2C):                                2D Strain GLS  -19.5 %                                (A3C):                                2D Strain GLS  -16.3 %                                (A4C):                                2D Strain GLS  -18.9 %                                Avg:                                 3D Volume EF                                LV 3D EF:    Left                                             ventricul                                             ar  ejection                                             fraction                                             by 3D                                             volume is                                             61 %.                                 3D Volume EF:                                3D EF:        61 %                                LV EDV:       88 ml                                LV ESV:       34 ml                                LV SV:        54 ml RIGHT VENTRICLE RV S prime:     10.40 cm/s TAPSE (M-mode): 1.8 cm LEFT ATRIUM             Index        RIGHT ATRIUM           Index LA diam:        3.50 cm 2.20 cm/m   RA Area:     14.20 cm LA Vol (A2C):   66.0 ml 41.46 ml/m  RA Volume:   34.80 ml  21.86 ml/m LA Vol (A4C):   44.5 ml 27.95 ml/m LA Biplane Vol: 58.5 ml 36.75 ml/m  AORTIC VALVE             PULMONIC VALVE LVOT Vmax:   82.40 cm/s  PR End Diast Vel: 1.52 msec LVOT Vmean:  57.100 cm/s LVOT VTI:    0.201 m  AORTA Ao Root diam: 3.20 cm Ao Asc diam:  3.30 cm MITRAL VALVE               TRICUSPID VALVE MV Area (PHT): 3.43 cm    TR Peak grad:   19.4 mmHg MV Decel Time: 221 msec    TR Vmax:         220.00  cm/s MV E velocity: 81.70 cm/s MV A velocity: 80.20 cm/s  SHUNTS MV E/A ratio:  1.02        Systemic VTI:  0.20 m                            Systemic Diam: 1.90 cm Fransico Him MD Electronically signed by Fransico Him MD Signature Date/Time: 05/18/2022/10:16:29 AM    Final    CT Angio Chest PE W and/or Wo Contrast  Result Date: 05/18/2022 CLINICAL DATA:  Syncope EXAM: CT ANGIOGRAPHY CHEST WITH CONTRAST TECHNIQUE: Multidetector CT imaging of the chest was performed using the standard protocol during bolus administration of intravenous contrast. Multiplanar CT image reconstructions and MIPs were obtained to evaluate the vascular anatomy. RADIATION DOSE REDUCTION: This exam was performed according to the departmental dose-optimization program which includes automated exposure control, adjustment of the mA and/or kV according to patient size and/or use of iterative reconstruction technique. CONTRAST:  82m OMNIPAQUE IOHEXOL 350 MG/ML SOLN COMPARISON:  CT abdomen pelvis 04/08/2020 FINDINGS: Cardiovascular: Contrast injection is sufficient to demonstrate satisfactory opacification of the pulmonary arteries to the segmental level. There is no pulmonary embolus or evidence of right heart strain. The size of the main pulmonary artery is normal. Heart size is normal, with no pericardial effusion. The course and caliber of the aorta are normal. There is no atherosclerotic calcification. Opacification decreased due to pulmonary arterial phase contrast bolus timing. Mediastinum/Nodes: No mediastinal, hilar or axillary lymphadenopathy. Normal visualized thyroid. Thoracic esophageal course is normal. Lungs/Pleura: Airways are patent. No pleural effusion, lobar consolidation, pneumothorax or pulmonary infarction. Upper Abdomen: Contrast bolus timing is not optimized for evaluation of the abdominal organs. Left hydronephrosis is unchanged compared to 04/08/2020. Musculoskeletal: No chest wall abnormality. No bony spinal  canal stenosis. Review of the MIP images confirms the above findings. IMPRESSION: 1. No pulmonary embolus or acute aortic syndrome. 2. Left hydronephrosis is unchanged since 04/08/2020. Electronically Signed   By: KUlyses JarredM.D.   On: 05/18/2022 01:58   DG Chest 2 View  Result Date: 05/17/2022 CLINICAL DATA:  Chest pain EXAM: CHEST - 2 VIEW COMPARISON:  05/19/2020 FINDINGS: There is interval increase in transverse diameter of heart. There are no signs of pulmonary edema or focal pulmonary consolidation. There is no pleural effusion or pneumothorax. Pacemaker battery is seen in left infraclavicular region. No significant interval changes are noted in the lung fields. IMPRESSION: There are no focal infiltrates or signs of pulmonary edema. Electronically Signed   By: PElmer PickerM.D.   On: 05/17/2022 19:26   CUP PACEART REMOTE DEVICE CHECK  Result Date: 05/16/2022 Scheduled remote reviewed. Normal device function.  Next remote 91 days- JJB   Assessment and Plan:   LOC:  We called [employer] but could not find the interrogation.  There was the remote done and one done in the ED.  I cannot find the ED record.  They will come and repeat this.  However, I don't suspect pacer malfunction.  I suspect a vasodepressor syncope.  The etiology is not clear.  The only objective abnormality is significant glucosuria which could be associated with some volume depletion.  I have suggested reducing the Norvasc to 5 mg for now and hydrating.  I would also consider compression garments (splanchnic) and situational awareness to avoid sudden changes in position and to lie down and put her feet up with low BPs.    HTN:  See above  CHEST PAIN:  Pretest probability of obstructive CAD is low.  There is no objective evidence of ischemia.  I am not suggesting in patient ischemia evaluation.  If pain persists she could be considered for out patient POET (Plain Old Exercise Treadmill)      For questions or updates,  please contact [place] Please consult [email or web address] for contact info under Cardiology/STEMI.   Signed, Minus Breeding, MD  05/18/2022 5:10 PM

## 2022-05-18 NOTE — H&P (Addendum)
History and Physical    Patient: Kim Pennington ZOX:[id number] DOB: [birth year] DOA: 05/17/2022 DOS: the patient was seen and examined on 05/18/2022 PCP: Vickie Epley, MD  Patient coming from: Home  Chief Complaint:  Chief Complaint  Patient presents with   Chest Pain   Loss of Consciousness   HPI: Kim Pennington is a 66 y.o. female with medical history significant of hypertension, hyperlipidemia, SSS s/p pacemaker, CKD stage III s/p right nephrectomy, hepatitis B, anemia, lymphocytic colitis who presents after having 2 syncopal episodes at home 2 days ago.  She reported that her blood pressures had been labile with lows of 70/50.  She had been up on her way in and been standing for some time prior to passing out.  She reported feeling lightheaded prior to the episode.  After coming to patient had episode of vomiting and subsequently passed out again.  Noted associated symptoms of heaviness in her chest and felt like her heart was skipping around.  She has had lower extremity swelling in her legs whenever she is up on her feet all day, but it usually goes away after elevating her feet at night.  Last device interrogation from 12/11 noted normal functioning with 3 AMS episodes as well as AT/AF with burden less than 1%..She reported similar symptoms like this in the past when she had the pacemaker initially placed back in 2015.  She notified Dr. [name] yesterday who advised her to come to the emergency department for further evaluation.  Denies having any recent fevers, chills, shortness of breath, cough, diarrhea, or dysuria symptoms.  Patient denies any changes in her current blood pressure medications.  She reported being in her normal state of health without significant change in appetite prior to the episodes.  In the ED patient was noted to have heart rates 59-71, respirations 12-22, and all other vital signs maintained.  EKG did not show any significant signs of ischemia.  Labs since  12/13 noted BUN 17 -> 15, creatinine 1.16-> 1.05,  ALT 53-> 48, and high-sensitivity troponins negative x 3.  Pacemaker was interrogated and reported to be functioning properly.  Chest x-ray noted interval increase in the transverse diameter of the heart and no signs of infiltrate or edema.  Urinalysis positive for greater than 500 glucose.  CTA of the chest noted no signs of a pulmonary embolus or acute aortic syndrome and left hydronephrosis unchanged since 04/2020.  Review of Systems: As mentioned in the history of present illness. All other systems reviewed and are negative. Past Medical History:  Diagnosis Date   Acquired solitary kidney    right nephrectomy   Anemia    Anxiety    Arthritis    C. difficile diarrhea    Depression    Headache    Hepatitis B    Hyperlipidemia    Hypertension    Kidney disease, chronic, stage III (GFR 30-59 ml/min) (HCC)    Kidney stones    Lymphocytic colitis    Memory loss    Osteoporosis    Pacemaker    Renal failure, chronic, stage 3 (moderate) ([place]) 2015   Left kidney    Skin cancer    Past Surgical History:  Procedure Laterality Date   ABDOMINAL HYSTERECTOMY  11/2006   ABDOMINOPLASTY     BREAST LUMPECTOMY Right 1979   CARDIAC CATHETERIZATION  04/24/2014   CARPAL TUNNEL RELEASE Bilateral 03/29/2009   CESAREAN SECTION     x4   COLONOSCOPY     FOOT SURGERY  Bilateral    HIP ARTHROSCOPY Right 09/14/2016   reduction of lesser trochanter    HIP ARTHROSCOPY Left 04/25/2016   w/ femoroplasty   left thumb tendon  04/25/2016   LIPOSUCTION     LUMBAR LAMINECTOMY  11/23/2019   foraminotomy, discectomy, facectomy   NEPHRECTOMY Right 2009   PACEMAKER IMPLANT  04/24/2014   RHINOPLASTY     x 2, 1999, 2003   ROTATOR CUFF REPAIR Right    SHOULDER ARTHROSCOPY Right 12/30/2018   x2   TONSILLECTOMY  0932   UMBILICAL HERNIA REPAIR  1998   UPPER GASTROINTESTINAL ENDOSCOPY     WEIL OSTEOTOMY Bilateral 11/27/2019   Social History:  reports that  she has never smoked. She has never used smokeless tobacco. She reports that she does not drink alcohol and does not use drugs.  Allergies  Allergen Reactions   Tape Hives    Tape adhesive long period of time     Family History  Problem Relation Age of Onset   Diabetes Mother    Parkinson's disease Mother    Alzheimer's disease Mother    Heart disease Father    Alzheimer's disease Father    Hypertension Father    CVA Father    Rheum arthritis Father    Stomach cancer Sister    Hyperlipidemia Sister    Hypertension Sister    Diabetes Sister    Liver disease Sister    Drug abuse Sister    Obesity Sister    Hyperlipidemia Sister    Hypertension Sister    Diabetes Sister    Heart disease Sister    Obesity Sister    Diabetes Sister    Breast cancer Sister 34       x2   Hyperlipidemia Sister    Hypertension Sister    Colon polyps Sister    Obesity Sister    Hyperlipidemia Sister    Hypertension Sister    Obesity Sister    Hyperlipidemia Brother    Hypertension Brother    Heart disease Brother    Obesity Brother    Diabetes Maternal Aunt    Diabetes Maternal Grandmother    Heart attack Paternal Grandfather    Osteoarthritis Daughter    Obesity Son    Colon cancer Neg Hx    Esophageal cancer Neg Hx     Prior to Admission medications   Medication Sig Start Date End Date Taking? Authorizing Provider  acyclovir (ZOVIRAX) 400 MG tablet Take 1 tablet (400 mg total) by mouth 2 (two) times daily. 12/16/21   Waunita Schooner, MD  alendronate (FOSAMAX) 70 MG tablet TAKE 1 TABLET(70 MG) BY MOUTH 1 TIME A WEEK 01/05/22   Waunita Schooner, MD  ALPRAZolam Duanne Moron) 1 MG tablet TAKE 1 TABLET(1 MG) BY MOUTH AT BEDTIME AS NEEDED 03/10/22   Waunita Schooner, MD  amLODipine (NORVASC) 10 MG tablet Take 1 tablet (10 mg total) by mouth daily. 08/23/21   Waunita Schooner, MD  atorvastatin (LIPITOR) 80 MG tablet TAKE 1 TABLET(80 MG) BY MOUTH DAILY 11/22/21   Waunita Schooner, MD  Calcium Carbonate-Vit D-Min  (CALCIUM 1200 PO) Take 1 tablet by mouth daily.    [provider]  CRANBERRY-CALCIUM PO Take 1 tablet by mouth daily.    [provider]  dicyclomine (BENTYL) 10 MG capsule TAKE 1 CAPSULE(10 MG) BY MOUTH 2 TO 3 TIMES A DAY AS NEEDED FOR ABDOMINAL CRAMPS OR DIARRHEA 11/17/21   Doran Stabler, MD  estradiol (ESTRACE) 0.5 MG tablet TAKE  1 TABLET BY MOUTH DAILY 12/19/21   Waunita Schooner, MD  losartan (COZAAR) 25 MG tablet TAKE 1 TABLET(25 MG) BY MOUTH DAILY 01/05/22   Waunita Schooner, MD  montelukast (SINGULAIR) 10 MG tablet TAKE 1 TABLET BY MOUTH DAILY AS NEEDED 03/10/22   Waunita Schooner, MD  ondansetron (ZOFRAN) 4 MG tablet Take 1 tablet (4 mg total) by mouth every 8 (eight) hours as needed for nausea or vomiting. 01/20/22   Bedsole, Amy E, MD  prazosin (MINIPRESS) 1 MG capsule TAKE 1 TO 2 CAPSULES(1 TO 2 MG) BY MOUTH AT BEDTIME 11/22/21   Waunita Schooner, MD  predniSONE (DELTASONE) 20 MG tablet 3 tabs by mouth daily x 5 days, then 2 tabs by mouth daily x 5 days then 1 tab by mouth daily x 5 days 01/20/22   Jinny Sanders, MD  vitamin B-12 (CYANOCOBALAMIN) 1000 MCG tablet Take 1 tablet (1,000 mcg total) by mouth daily. 06/29/20   Waunita Schooner, MD    Physical Exam: Vitals:   05/18/22 0530 05/18/22 0615 05/18/22 0700 05/18/22 0740  BP: 117/86 114/69 118/70   Pulse: 66 62 60   Resp: (!) '22 16 16   '$ Temp:    98.1 F (36.7 C)  TempSrc:    Oral  SpO2: 97% 96% 96%   Weight:      Height:        Constitutional: Elderly female currently in no acute distress Eyes: lids and conjunctivae normal ENMT: Mucous membranes are moist. Normal dentition.  Neck: normal, supple, no JVD appreciated Respiratory: clear to auscultation bilaterally, no wheezing, no crackles. Normal respiratory effort. No accessory muscle use.  Cardiovascular: Regular rate and rhythm, soft systolic murmur. No extremity edema.  Musculoskeletal: no clubbing / cyanosis. No joint deformity upper and lower extremities. Good ROM,  no contractures. Normal muscle tone.  Skin: no rashes, lesions, ulcers. No induration Neurologic: CN 2-12 grossly intact.  Appears able to move all extremities psychiatric: Normal judgment and insight. Alert and oriented x 3. Normal mood.   Data Reviewed:  EKG reveals sinus rhythm at 74 bpm with age-indeterminate inferior and anterior infarcts.  Reviewed labs, imaging, and pertinent records as noted above in HPI.  Assessment and Plan:  Syncope Patient presents after having 2 syncopal episodes with reported prodrome.  High-sensitivity troponins were negative x 3 and EKG did not note any significant ischemic changes.  Patient was evaluated with a CT angiogram of the chest given patient on estrogen supplementation, but noted to be negative for any signs of a pulmonary embolus or acute aortic syndrome.  Device interrogation of her pacemaker noted 3 automatic mode switching episodes as well as AT/A-fib with burden less than 1% -Admit to a cardiac telemetry bed -Check orthostatic vitals -Check TSH -Check echocardiogram(noted similar EF of 60 -93% without diastolic dysfunction appreciated) -Follow-up telemetry -Cardiology consulted, will follow-up for any further recommendations  Paroxysmal atrial fibrillation Patient noted to have 3 automatic mode switching episodes with AT/AF burden less than 1%.  CHA2DS2-VASc score at least 3(age, sex, HTN).  Based this she should be at least considered for anticoagulation. -Defer to cardiology  Glucosuria Acute.  Patient noted to have greater than 500 glucose in the urine with no significant signs of bacteria.  Previously not documented. -Check hemoglobin A1c  Essential hypertension Blood pressures have been noted to be 97/54-137/79.  Home blood pressure medications include losartan 25 mg daily, amlodipine 10 mg daily, and prazosin 1 mg nightly -Held amlodipine -Continue losartan and prazosin  Diastolic CHF  Chronic.  Patient appears to be euvolemic with  no signs of fluid overload at this time.  Last EF noted to be 60-65% with grade 2 diastolic dysfunction back in 2022. -Strict I&Os and daily weights  Sinus node dysfunction s/p pacemaker Pacemaker initially placed in 2015.  -Follow-up pacemaker interrogation report  Anxiety  -Continue Ativan as needed  Left hydronephrosis history of right nephrectomy chronic renal insufficiency stage IIIa Chronic.  Creatinine 1.05 creatinine 1.05 which appears around patient's baseline.  CT no noted unchanged appearance in the left hydronephrosis since 04/2020.  DVT prophylaxis: Lovenox Advance Care Planning:   Code Status: Full Code   Consults: Cardiology  Family Communication: Sister updated at bedside  Severity of Illness: The appropriate patient status for this patient is OBSERVATION. Observation status is judged to be reasonable and necessary in order to provide the required intensity of service to ensure the patient's safety. The patient's presenting symptoms, physical exam findings, and initial radiographic and laboratory data in the context of their medical condition is felt to place them at decreased risk for further clinical deterioration. Furthermore, it is anticipated that the patient will be medically stable for discharge from the hospital within 2 midnights of admission.   Author: Norval Morton, MD 05/18/2022 8:04 AM  For on call review [email or web address].

## 2022-05-18 NOTE — ED Notes (Signed)
Transport at bedside  

## 2022-05-18 NOTE — ED Notes (Signed)
Breakfast Ordered 

## 2022-05-18 NOTE — ED Provider Notes (Signed)
Discussed with cardiology fellow Dr. Chancy Milroy They recommend echocardiogram in the morning.  They agree with hospitalist admission and have a low suspicion overall for this being a primarily cardiac issue   Tedd Sias, PA 05/18/22 0603    Maudie Flakes, MD 05/18/22 [phone]

## 2022-05-18 NOTE — Telephone Encounter (Signed)
Left message to call back  

## 2022-05-18 NOTE — ED Notes (Signed)
Pacemaker being interpreted at this time.

## 2022-05-18 NOTE — ED Notes (Signed)
Pt c/o nausea. Pt medicated

## 2022-05-18 NOTE — Telephone Encounter (Signed)
Patient states she has been in the ED since yesterday. She says they told her this morning they wanted her seen by the cardiac floor to be observed for 24 hrs, but that was this morning and she is still waiting. She says she does not want to go home, but she does not know what she is doing.

## 2022-05-18 NOTE — ED Notes (Signed)
Pt c/o chest pressure and "flipping in chest." Dr. Tamala Julian aware.

## 2022-05-18 NOTE — ED Notes (Signed)
Called 3E at this time for purple man.

## 2022-05-18 NOTE — Progress Notes (Signed)
  Echocardiogram 2D Echocardiogram has been performed.  Wynelle Link 05/18/2022, 9:57 AM

## 2022-05-18 NOTE — ED Notes (Signed)
Provided pt with Pacemaker interrogation to give to RN upstairs. Secure chat sent to RN.

## 2022-05-18 NOTE — Progress Notes (Signed)
  Carryover admission to the Day Admitter.  I discussed this case with the EDP, Pati Gallo, PA.  Per these discussions:   This is a 66 year old female with history of symptomatic bradycardia/sick sinus syndrome status post pacemaker placement who is being admitted for further evaluation of syncope after experiencing 2 episodes of syncope with prodrome over the last day.   EKG reportedly showed sinus rhythm without evidence of acute ischemic changes.  Pacemaker was interrogated in the ED this evening, and reportedly showed no evidence of findings contributory to her presenting syncope.  Initial troponin less than 2, with repeat troponin noted to be 3.  CTA chest ordered, the results currently pending.  EDP discussed patient's case with on-call cardiology fellow, Dr. Humphrey Rolls, Who recommended overnight observation for further monitoring on telemetry as well as pursuit of echocardiogram (ordered), and conveyed the cardiology will be available if additional consultation is needed.   I have placed an order for observation to cardiac telemetry.  I have placed some additional preliminary admit orders via the adult multi-morbid admission order set. I have also ordered troponin to be checked with a.m. labs, along with order for checking of orthostatic vital signs.  Also ordered additional morning labs in the form of CMP, CBC, magnesium level.   Babs Bertin, DO Hospitalist

## 2022-05-19 ENCOUNTER — Other Ambulatory Visit: Payer: Self-pay

## 2022-05-19 DIAGNOSIS — R55 Syncope and collapse: Secondary | ICD-10-CM | POA: Diagnosis not present

## 2022-05-19 DIAGNOSIS — I959 Hypotension, unspecified: Secondary | ICD-10-CM | POA: Diagnosis not present

## 2022-05-19 LAB — URINALYSIS, ROUTINE W REFLEX MICROSCOPIC
Bilirubin Urine: NEGATIVE
Glucose, UA: NEGATIVE mg/dL
Hgb urine dipstick: NEGATIVE
Ketones, ur: NEGATIVE mg/dL
Leukocytes,Ua: NEGATIVE
Nitrite: NEGATIVE
Protein, ur: NEGATIVE mg/dL
Specific Gravity, Urine: 1.008 (ref 1.005–1.030)
pH: 6 (ref 5.0–8.0)

## 2022-05-19 LAB — GLUCOSE, CAPILLARY: Glucose-Capillary: 108 mg/dL — ABNORMAL HIGH (ref 70–99)

## 2022-05-19 MED ORDER — PNEUMOCOCCAL 20-VAL CONJ VACC 0.5 ML IM SUSY
0.5000 mL | PREFILLED_SYRINGE | INTRAMUSCULAR | Status: AC | PRN
  Administered 2022-05-19: 0.5 mL via INTRAMUSCULAR
  Filled 2022-05-19: qty 0.5

## 2022-05-19 MED ORDER — SODIUM CHLORIDE 0.9 % IV BOLUS
1000.0000 mL | Freq: Once | INTRAVENOUS | Status: AC
  Administered 2022-05-19: 1000 mL via INTRAVENOUS

## 2022-05-19 MED ORDER — PRAZOSIN HCL 1 MG PO CAPS: 1.0000 mg | ORAL_CAPSULE | Freq: Every day | ORAL | Status: DC

## 2022-05-19 NOTE — Telephone Encounter (Signed)
Left message to call back  

## 2022-05-19 NOTE — TOC Transition Note (Signed)
Transition of Care [facility]) - CM/SW Discharge Note   Patient Details  Name: Kim Pennington MRN: [id number] Date of Birth: 1955-08-16  Transition of Care [facility] CM/SW Contact:  Zenon Mayo, RN Phone Number: 05/19/2022, 1:57 PM   Clinical Narrative:    Patient is for dc today, she has no needs. Her daughter is at the bedside and will transport her home today.          Patient Goals and CMS Choice        Discharge Placement                       Discharge Plan and Services                                     Social Determinants of Health (SDOH) Interventions     Readmission Risk Interventions     No data to display

## 2022-05-19 NOTE — Telephone Encounter (Signed)
Pt called stating discharge instructions indicated to stop losartan and amlodipine. However, pt stated she has 1 remaining kidney and stage 3 renal failure. Pt reported she believe her urologist prescribed amlodipine to help with her kidney and wanted to make sure it was ok to stop.  Will forward to MD for review.

## 2022-05-19 NOTE — Telephone Encounter (Signed)
Pt is returning call.  

## 2022-05-19 NOTE — Discharge Summary (Signed)
Physician Discharge Summary  Kim Pennington SVX:[id number] DOB: [birth year]  PCP: No primary care provider on file.  Admitted from: Home Discharged to: Home  Admit date: 05/17/2022 Discharge date: 05/19/2022  Recommendations for Outpatient Follow-up:    Follow-up Information     Kim Morrow, NP. Go on 05/31/2022.   Why: '@10'$ ;00am.  To be seen with repeat labs (CBC & BMP). Contact information: [address] [place] [address] [place] [address] [phone]         Vickie Epley, MD. Schedule an appointment as soon as possible for a visit.   Specialties: Cardiology, Radiology Contact information: [address] [place] [address] [place]: None    Equipment/Devices: None    Discharge Condition: Improved and stable.   Code Status: Full Code Diet recommendation:  Discharge Diet Orders (From admission, onward)     Start     Ordered   05/19/22 0000  Diet - low sodium heart healthy        05/19/22 1319             Discharge Diagnoses:  Principal Problem:   Syncope Active Problems:   PAF (paroxysmal atrial fibrillation) (HCC)   Glucosuria   Hypertension   Chronic diastolic CHF (congestive heart failure) (HCC)   Sinoatrial node dysfunction (HCC)   Anxiety   H/O right nephrectomy   Chronic renal insufficiency, stage III (moderate) (HCC)   Brief Summary: 66 year old married female, PMH of HTN, HLD, SSS s/p PPM, CKD stage III, s/p right nephrectomy, hepatitis B, anemia, lymphocytic colitis, presented to the ED on 05/18/2022 following recurrent syncopal episodes.  As per cardiology consultation, patient had been okay up until 3 days PTA.  She reported a sense of diaphoresis and an odd sensation followed by multiple episodes of syncope.  Her device was interrogated remotely on 12/11, there were no arrhythmias or device malfunction to explain these episodes.  She had labile blood pressures with SBP in the 70s  while standing, walk to tell her husband and then had a frank syncope.  She reports being physically active with 2 hours of weightlifting daily and aerobic exercises well.  She was admitted for further evaluation and management.  Both cardiology and EP cardiology consulted.  Troponins x 3 negative.  CMP and CBC unremarkable.  TSH normal.  UA not suggestive of UTI.  CTA chest without pulmonary embolus or acute aortic syndrome.  Unchanged left hydronephrosis since 2021.  TTE showed LVEF 38-93%, normal diastolic function and no aortic stenosis.  Overnight of admission, when nursing were attempting to take static vital signs, patient did not feel well, sat down on the side of the bed, patient noted to be diaphoretic and not responding, placed back in the bed and vital signs reveal SBP in the 60s.  She was placed in Trendelenburg position.  Rapid response was activated.  By the time rapid response arrived, she was alert and oriented.  Reported some headache/dizziness.  BP was up to 109/64.  She was bolused with IV fluids and maintenance IV fluids were continued.  Subsequent blood pressure checks earlier this morning showed soft blood pressures without orthostatic changes.  Cardiology saw her this morning, felt that she had a vasodepressor event last evening.  Cozaar and amlodipine were discontinued.  She was counseled to orally hydrate and liberalize salt intake.  She was also advised spanks.  Patient was then  evaluated by EP cardiology who felt that she had dysautonomia, likely hyper vagotonia.  They indicated that device interrogation was unremarkable.  They discussed with her and daughter at bedside the awareness of triggers and needing to become supine as quickly as a safe to do so when she has symptoms, recommended compression garments especially those centric on the thighs and abdomen.  Both cardiology and EP cardiology cleared her for discharge home.  She and daughter at bedside were specifically counseled by  this MD that she should not drive for at least 6 months and they verbalized understanding.  Rest of evaluation and management as per admitting provider's H&P as copied below  Assessment and Plan:   Syncope Patient presents after having 2 syncopal episodes with reported prodrome.  High-sensitivity troponins were negative x 3 and EKG did not note any significant ischemic changes.  Patient was evaluated with a CT angiogram of the chest given patient on estrogen supplementation, but noted to be negative for any signs of a pulmonary embolus or acute aortic syndrome.  Device interrogation of her pacemaker noted 3 automatic mode switching episodes as well as AT/A-fib with burden less than 1% -Admit to a cardiac telemetry bed -Check orthostatic vitals -Check TSH -Check echocardiogram(noted similar EF of 60 -29% without diastolic dysfunction appreciated) -Follow-up telemetry -Cardiology consulted, will follow-up for any further recommendations   Paroxysmal atrial fibrillation Patient noted to have 3 automatic mode switching episodes with AT/AF burden less than 1%.  CHA2DS2-VASc score at least 3(age, sex, HTN).  Based this she should be at least considered for anticoagulation. -Defer to cardiology   Glucosuria Acute.  Patient noted to have greater than 500 glucose in the urine with no significant signs of bacteria.  Previously not documented. -Check hemoglobin A1c: 5.7   Essential hypertension Blood pressures have been noted to be 97/54-137/79.  Home blood pressure medications include losartan 25 mg daily, amlodipine 10 mg daily, and prazosin 1 mg nightly Amlodipine and losartan were discontinued.  Continuing home dose of prazosin 1 mg at bedtime.   Diastolic CHF Chronic.  Patient appears to be euvolemic with no signs of fluid overload at this time.  Last EF noted to be 60-65% with grade 2 diastolic dysfunction back in 2022. -Strict I&Os and daily weights   Sinus node dysfunction s/p  pacemaker Pacemaker initially placed in 2015.  -Follow-up pacemaker interrogation report   Anxiety  -Continue Ativan as needed   Left hydronephrosis history of right nephrectomy chronic renal insufficiency stage IIIa Chronic.  Creatinine 1.05 creatinine 1.05 which appears around patient's baseline.  CT no noted unchanged appearance in the left hydronephrosis since 04/2020.   Consultations: Cardiology EP cardiology  Procedures: None   Discharge Instructions  Discharge Instructions     Call MD for:   Complete by: As directed    Recurrent passing out or feeling like passing out.   Call MD for:  extreme fatigue   Complete by: As directed    Call MD for:  persistant dizziness or light-headedness   Complete by: As directed    Diet - low sodium heart healthy   Complete by: As directed    Driving Restrictions   Complete by: As directed    No driving for 6 months.  This has been clearly explained to the patient in the presence of her daughter at bedside and they both verbalized understanding.   Increase activity slowly   Complete by: As directed         Medication List  STOP taking these medications    amLODipine 10 MG tablet Commonly known as: NORVASC   dicyclomine 10 MG capsule Commonly known as: BENTYL   losartan 25 MG tablet Commonly known as: COZAAR   predniSONE 20 MG tablet Commonly known as: DELTASONE       TAKE these medications    acyclovir 400 MG tablet Commonly known as: ZOVIRAX Take 1 tablet (400 mg total) by mouth 2 (two) times daily.   alendronate 70 MG tablet Commonly known as: FOSAMAX TAKE 1 TABLET(70 MG) BY MOUTH 1 TIME A WEEK What changed: See the new instructions. Notes to patient: Resume Home Regimen   ALPRAZolam 1 MG tablet Commonly known as: XANAX TAKE 1 TABLET(1 MG) BY MOUTH AT BEDTIME AS NEEDED What changed: See the new instructions.   atorvastatin 80 MG tablet Commonly known as: LIPITOR TAKE 1 TABLET(80 MG) BY MOUTH  DAILY What changed: See the new instructions.   CALCIUM 1200 PO Take 1 tablet by mouth daily. Notes to patient: Resume Home Regimen   CRANBERRY-CALCIUM PO Take 1 tablet by mouth daily. Notes to patient: Resume Home Regimen   cyanocobalamin 1000 MCG tablet Commonly known as: VITAMIN B12 Take 1 tablet (1,000 mcg total) by mouth daily. Notes to patient: Resume Home Regimen   estradiol 0.5 MG tablet Commonly known as: ESTRACE TAKE 1 TABLET BY MOUTH DAILY Notes to patient: Resume Home Regimen   montelukast 10 MG tablet Commonly known as: SINGULAIR TAKE 1 TABLET BY MOUTH DAILY AS NEEDED What changed: reasons to take this   ondansetron 4 MG tablet Commonly known as: Zofran Take 1 tablet (4 mg total) by mouth every 8 (eight) hours as needed for nausea or vomiting.   OVER THE COUNTER MEDICATION Take 3 tablets by mouth daily. Plesus, 1 tab am and 2 in pm   prazosin 1 MG capsule Commonly known as: MINIPRESS Take 1 capsule (1 mg total) by mouth at bedtime. What changed: See the new instructions.       Allergies  Allergen Reactions   Tape Hives    Tape adhesive long period of time       Procedures/Studies: ECHOCARDIOGRAM COMPLETE  Result Date: 05/18/2022    ECHOCARDIOGRAM REPORT   Patient Name:   Kim Pennington Date of Exam: 05/18/2022 Medical Rec #:  [id number]      Height:       62.0 in Accession #:    [id number]     Weight:       130.0 lb Date of Birth:  June 23, 1955      BSA:          1.592 m Patient Age:    36 years       BP:           129/92 mmHg Patient Gender: F              HR:           64 bpm. Exam Location:  Inpatient Procedure: 2D Echo, 3D Echo, Cardiac Doppler, Color Doppler and Strain Analysis Indications:    Abnormal ECG R94.31  History:        Patient has prior history of Echocardiogram examinations, most                 recent 08/11/2020. Pacemaker, Signs/Symptoms:Syncope and Chest                 Pain; Risk Factors:Hypertension, Non-Smoker and Dyslipidemia.   Sonographer:    Greer Pickerel Referring Phys:  [id number] [facility] K[facility]AN  Sonographer Comments: Global longitudinal strain was attempted. IMPRESSIONS  1. Left ventricular ejection fraction, by estimation, is 60 to 65%. Left ventricular ejection fraction by 3D volume is 61 %. The left ventricle has normal function. The left ventricle has no regional wall motion abnormalities. Left ventricular diastolic  parameters were normal. The average left ventricular global longitudinal strain is -18.9 %. The global longitudinal strain is normal.  2. Right ventricular systolic function is normal. The right ventricular size is normal. There is normal pulmonary artery systolic pressure. The estimated right ventricular systolic pressure is 97.0 mmHg.  3. Left atrial size was mildly dilated.  4. The mitral valve is normal in structure. Trivial mitral valve regurgitation. No evidence of mitral stenosis.  5. The aortic valve is normal in structure. Aortic valve regurgitation is not visualized. No aortic stenosis is present.  6. The inferior vena cava is dilated in size with <50% respiratory variability, suggesting right atrial pressure of 15 mmHg. FINDINGS  Left Ventricle: Left ventricular ejection fraction, by estimation, is 60 to 65%. Left ventricular ejection fraction by 3D volume is 61 %. The left ventricle has normal function. The left ventricle has no regional wall motion abnormalities. The average left ventricular global longitudinal strain is -18.9 %. The global longitudinal strain is normal. The left ventricular internal cavity size was normal in size. There is no left ventricular hypertrophy. Left ventricular diastolic parameters were normal. Normal left ventricular filling pressure. Right Ventricle: The right ventricular size is normal. No increase in right ventricular wall thickness. Right ventricular systolic function is normal. There is normal pulmonary artery systolic pressure. The tricuspid regurgitant velocity is 2.20  m/s, and  with an assumed right atrial pressure of 15 mmHg, the estimated right ventricular systolic pressure is 26.3 mmHg. Left Atrium: Left atrial size was mildly dilated. Right Atrium: Right atrial size was normal in size. Pericardium: Trivial pericardial effusion is present. The pericardial effusion is anterior to the right ventricle. Presence of epicardial fat layer. Mitral Valve: The mitral valve is normal in structure. Trivial mitral valve regurgitation. No evidence of mitral valve stenosis. Tricuspid Valve: The tricuspid valve is normal in structure. Tricuspid valve regurgitation is trivial. No evidence of tricuspid stenosis. Aortic Valve: The aortic valve is normal in structure. Aortic valve regurgitation is not visualized. No aortic stenosis is present. Pulmonic Valve: The pulmonic valve was normal in structure. Pulmonic valve regurgitation is trivial. No evidence of pulmonic stenosis. Aorta: The aortic root is normal in size and structure. Venous: The inferior vena cava is dilated in size with less than 50% respiratory variability, suggesting right atrial pressure of 15 mmHg. IAS/Shunts: No atrial level shunt detected by color flow Doppler.  LEFT VENTRICLE PLAX 2D LVIDd:         4.20 cm         Diastology LVIDs:         3.00 cm         LV e' medial:    8.49 cm/s LV PW:         0.90 cm         LV E/e' medial:  9.6 LV IVS:        0.70 cm         LV e' lateral:   10.00 cm/s LVOT diam:     1.90 cm         LV E/e' lateral: 8.2 LV SV:         57 LV SV  Index:   36              2D LVOT Area:     2.84 cm        Longitudinal                                Strain                                2D Strain GLS  -20.9 %                                (A2C):                                2D Strain GLS  -19.5 %                                (A3C):                                2D Strain GLS  -16.3 %                                (A4C):                                2D Strain GLS  -18.9 %                                 Avg:                                 3D Volume EF                                LV 3D EF:    Left                                             ventricul                                             ar                                             ejection                                             fraction  by 3D                                             volume is                                             61 %.                                 3D Volume EF:                                3D EF:        61 %                                LV EDV:       88 ml                                LV ESV:       34 ml                                LV SV:        54 ml RIGHT VENTRICLE RV S prime:     10.40 cm/s TAPSE (M-mode): 1.8 cm LEFT ATRIUM             Index        RIGHT ATRIUM           Index LA diam:        3.50 cm 2.20 cm/m   RA Area:     14.20 cm LA Vol (A2C):   66.0 ml 41.46 ml/m  RA Volume:   34.80 ml  21.86 ml/m LA Vol (A4C):   44.5 ml 27.95 ml/m LA Biplane Vol: 58.5 ml 36.75 ml/m  AORTIC VALVE             PULMONIC VALVE LVOT Vmax:   82.40 cm/s  PR End Diast Vel: 1.52 msec LVOT Vmean:  57.100 cm/s LVOT VTI:    0.201 m  AORTA Ao Root diam: 3.20 cm Ao Asc diam:  3.30 cm MITRAL VALVE               TRICUSPID VALVE MV Area (PHT): 3.43 cm    TR Peak grad:   19.4 mmHg MV Decel Time: 221 msec    TR Vmax:        220.00 cm/s MV E velocity: 81.70 cm/s MV A velocity: 80.20 cm/s  SHUNTS MV E/A ratio:  1.02        Systemic VTI:  0.20 m                            Systemic Diam: 1.90 cm Fransico Him MD Electronically signed by Fransico Him MD Signature Date/Time: 05/18/2022/10:16:29 AM    Final    CT Angio Chest PE W and/or Wo Contrast  Result  Date: 05/18/2022 CLINICAL DATA:  Syncope EXAM: CT ANGIOGRAPHY CHEST WITH CONTRAST TECHNIQUE: Multidetector CT imaging of the chest was performed using the standard protocol during bolus administration of intravenous contrast.  Multiplanar CT image reconstructions and MIPs were obtained to evaluate the vascular anatomy. RADIATION DOSE REDUCTION: This exam was performed according to the departmental dose-optimization program which includes automated exposure control, adjustment of the mA and/or kV according to patient size and/or use of iterative reconstruction technique. CONTRAST:  55m OMNIPAQUE IOHEXOL 350 MG/ML SOLN COMPARISON:  CT abdomen pelvis 04/08/2020 FINDINGS: Cardiovascular: Contrast injection is sufficient to demonstrate satisfactory opacification of the pulmonary arteries to the segmental level. There is no pulmonary embolus or evidence of right heart strain. The size of the main pulmonary artery is normal. Heart size is normal, with no pericardial effusion. The course and caliber of the aorta are normal. There is no atherosclerotic calcification. Opacification decreased due to pulmonary arterial phase contrast bolus timing. Mediastinum/Nodes: No mediastinal, hilar or axillary lymphadenopathy. Normal visualized thyroid. Thoracic esophageal course is normal. Lungs/Pleura: Airways are patent. No pleural effusion, lobar consolidation, pneumothorax or pulmonary infarction. Upper Abdomen: Contrast bolus timing is not optimized for evaluation of the abdominal organs. Left hydronephrosis is unchanged compared to 04/08/2020. Musculoskeletal: No chest wall abnormality. No bony spinal canal stenosis. Review of the MIP images confirms the above findings. IMPRESSION: 1. No pulmonary embolus or acute aortic syndrome. 2. Left hydronephrosis is unchanged since 04/08/2020. Electronically Signed   By: KUlyses JarredM.D.   On: 05/18/2022 01:58   DG Chest 2 View  Result Date: 05/17/2022 CLINICAL DATA:  Chest pain EXAM: CHEST - 2 VIEW COMPARISON:  05/19/2020 FINDINGS: There is interval increase in transverse diameter of heart. There are no signs of pulmonary edema or focal pulmonary consolidation. There is no pleural effusion or  pneumothorax. Pacemaker battery is seen in left infraclavicular region. No significant interval changes are noted in the lung fields. IMPRESSION: There are no focal infiltrates or signs of pulmonary edema. Electronically Signed   By: PElmer PickerM.D.   On: 05/17/2022 19:26   CUP PACEART REMOTE DEVICE CHECK  Result Date: 05/16/2022 Scheduled remote reviewed. Normal device function.  Next remote 91 days- JJB     Subjective: Seen this morning along with the EP cardiology team in the room.  Daughter at bedside.  Patient just returned after ambulating to the bathroom and back.  Denied dizziness.  Felt some discomforting sensation in the chest.  Nausea but no vomiting, appears to be a chronic intermittent symptom  Discharge Exam:  Vitals:   05/19/22 0240 05/19/22 0245 05/19/22 0446 05/19/22 0845  BP: (!) 107/54 (!) 101/49 (!) 94/59 (!) 108/59  Pulse:   72 73  Resp:      Temp:    97.8 F (36.6 C)  TempSrc:    Oral  SpO2:   94% 98%  Weight:      Height:        General: Pleasant middle-age female, moderately built and nourished lying, propped up in bed without distress. Cardiovascular: S1 & S2 heard, RRR, S1/S2 +. No murmurs, rubs, gallops or clicks. No JVD or pedal edema.  Telemetry personally reviewed: Brief A-paced rhythm earlier in the admission followed by sinus rhythm.  No arrhythmias or pauses noted. Respiratory: Clear to auscultation without wheezing, rhonchi or crackles. No increased work of breathing. Abdominal:  Non distended, non tender & soft. No organomegaly or masses appreciated. Normal bowel sounds heard. CNS: Alert and oriented. No focal deficits.  Extremities: no edema, no cyanosis    The results of significant diagnostics from this hospitalization (including imaging, microbiology, ancillary and laboratory) are listed below for reference.     Microbiology: No results found for this or any previous visit (from the past 240 hour(s)).   Labs: CBC: Recent Labs   Lab 05/17/22 1848 05/18/22 0324  WBC 6.3 5.0  NEUTROABS 3.5 2.0  HGB 14.2 13.3  HCT 41.7 39.6  MCV 93.5 93.6  PLT 221 193    Basic Metabolic Panel: Recent Labs  Lab 05/17/22 1848 05/18/22 0324  NA 140 139  K 4.2 3.7  CL 104 106  CO2 25 23  GLUCOSE 102* 91  BUN 17 15  CREATININE 1.16* 1.05*  CALCIUM 10.0 9.2  MG  --  2.1    Liver Function Tests: Recent Labs  Lab 05/17/22 1848 05/18/22 0324  AST 40 33  ALT 53* 48*  ALKPHOS 46 40  BILITOT 0.5 0.8  PROT 7.3 6.2*  ALBUMIN 4.3 3.8    CBG: Recent Labs  Lab 05/17/22 2109 05/19/22 0058  GLUCAP 134* 108*    Hgb A1c Recent Labs    05/18/22 0324  HGBA1C 5.7*    Thyroid function studies Recent Labs    05/18/22 0324  TSH 1.141    Urinalysis    Component Value Date/Time   COLORURINE STRAW (A) 05/19/2022 0618   APPEARANCEUR CLEAR 05/19/2022 0618   LABSPEC 1.008 05/19/2022 0618   PHURINE 6.0 05/19/2022 0618   GLUCOSEU NEGATIVE 05/19/2022 0618   HGBUR NEGATIVE 05/19/2022 0618   BILIRUBINUR NEGATIVE 05/19/2022 0618   BILIRUBINUR negative 04/08/2020 1600   KETONESUR NEGATIVE 05/19/2022 0618   PROTEINUR NEGATIVE 05/19/2022 0618   UROBILINOGEN 0.2 04/08/2020 1600   NITRITE NEGATIVE 05/19/2022 0618   LEUKOCYTESUR NEGATIVE 05/19/2022 0618    Discussed in detail with patient's daughter at bedside.  Time coordinating discharge: 25 minutes  SIGNED:  Vernell Leep, MD,  FACP, [facility], [facility], [facility], [facility]   [facility] [place]     To contact the attending provider between 7A-7P or the covering provider during after hours 7P-7A, please log into the web site [email or web address] and access using universal [place] password for that web site. If you do not have the password, please call the hospital operator.

## 2022-05-19 NOTE — Significant Event (Addendum)
Rapid Response Event Note   Reason for Call :  Syncopy  Per RN, NT had just stood pt up to obtain orthostatic VS. Pt said she didn't feel well and sat down on side of bed. RN was called into room and found pt diaphoretic and not responding. Pt placed back in bed and VS obtained: SBP-60s. Pt placed in trendelenburg and RRT called.   Initial Focused Assessment:  Pt lying in bed with eyes closed, in no visible distress.  Pt is alert and oriented, denies CP/SOB. Pt does c/o headache/dizziness. Lungs clear t/o. Heart sounds WNL. Skin warm and dry.   HR-73, BP-109/64, RR-18, SpO2-98% on RA  EKG strip reviewed. Pt a-paced/SR prior to episode with no noted ectopy.  Interventions:  CBG-108  Plan of Care:  Pt is back to baseline, SBP>100. Await MD return call. Continue to monitor pt closely. Call RRT if further assistance needed.   Event Summary:   MD Notified: [place] paged by bedside RN, awaiting response Call [place] [phone]  Dillard Essex, RN

## 2022-05-19 NOTE — Discharge Instructions (Signed)

## 2022-05-19 NOTE — Progress Notes (Addendum)
Patient stood for nursing for blood pressure and vital check.  While standing patient states that she did not feel well.  She became diaphoretic, clammy, felt her heart fluttering and had a frank syncopal event.  Patient's blood pressure reading at that time was 65/50.  Patient laid down, repeat blood pressure 109/64.  Per patient with previous syncopal episode she has also had low systolic blood pressures in the 70s.  She denies chest pains  She additionally complains of pain over her left kidney, which she states just started.  Patient has a single kidney.  She denies urinary symptoms.  Normal saline bolus 1 L ordered.  IV fluid hydration continued. Urinalysis ordered, if negative will order a renal ultrasound

## 2022-05-19 NOTE — Telephone Encounter (Signed)
Pt currently admitted.

## 2022-05-19 NOTE — Consult Note (Signed)
ELECTROPHYSIOLOGY CONSULT NOTE    Patient ID: Kim Pennington MRN: [id number], DOB/AGE: 1956-02-27 66 y.o.  Admit date: 05/17/2022 Date of Consult: 05/19/2022  Primary Physician: No primary care provider on file. Primary Cardiologist: Vickie Epley, MD  Electrophysiologist: Dr. [name]   Referring Provider: Dr. Percival Spanish  Patient Profile: Kim Pennington is a 66 y.o. female with a history of syncope and syncope who is being seen today for the evaluation of recurrent syncope at the request of Dr. Percival Spanish.  HPI:  Kim Pennington is a 66 y.o. female with medical history as above.   Pt presented to [facility] with syncope. She reported that she would have a sense of diaphoresis and an odd sensation and did report multiple episodes of syncope.  She called our office earlier this week, and device was interrogated remotely on 12/11.  There was a note from Dr. Saunders Revel and from Dr. [name] commenting that there were no arrhythmias or device malfunction that would explain any episodes of AMS.  There were no sustained episodes of atrial fib and no suggestion for an indication for anticoagulation.    She had another episode of syncope for which she came to the ED during which she said that she felt strange and had one of these episodes and she took her BP while standing and her BP was 70 systolic.  She walked in to tell her husband and had frank syncope.  She has been having chest pain at the left upper chest where her pacemaker is.  She reports this for 3 days but also mentions that she has had this perhaps more.  She is able to be active without bringing on discomfort and reports 2 hours of weight lifting daily and aerobic exercise as well.   In the ED cardiac enzymes were negative. There was no evidence of PE on CT. Echo demonstrates NL LV function with no significant valvular abnormalities. She was not orthostatic.   EP asked to weigh in given her complicated picture.   On our exam, she is feeling OK.  Slight lightheadedness just returning from using the restroom. States she had been doing overall well until about 6 weeks ago. Symptoms can come out of nowhere regardless of her current level of activity.   Labs Potassium3.7 (12/14 4854) Magnesium  2.1 (12/14 0324) Creatinine, ser  1.05* (12/14 0324) PLT  201 (12/14 0324) HGB  13.3 (12/14 0324) WBC 5.0 (12/14 0324) Troponin I (High Sensitivity)3 (12/14 0324).     Past Medical History:  Diagnosis Date   Acquired solitary kidney    right nephrectomy   Anemia    Anxiety    Arthritis    C. difficile diarrhea    Depression    Hepatitis B    Hyperlipidemia    Hypertension    Kidney disease, chronic, stage III (GFR 30-59 ml/min) (HCC)    Kidney stones    Lymphocytic colitis    Memory loss    Osteoporosis    Pacemaker    Renal failure, chronic, stage 3 (moderate) ([place]) 2015   Left kidney    Skin cancer      Surgical History:  Past Surgical History:  Procedure Laterality Date   ABDOMINAL HYSTERECTOMY  11/2006   ABDOMINOPLASTY     BREAST LUMPECTOMY Right 1979   CARDIAC CATHETERIZATION  04/24/2014   CARPAL TUNNEL RELEASE Bilateral 03/29/2009   CESAREAN SECTION     x4   COLONOSCOPY     FOOT SURGERY Bilateral    HIP ARTHROSCOPY  Right 09/14/2016   reduction of lesser trochanter    HIP ARTHROSCOPY Left 04/25/2016   w/ femoroplasty   left thumb tendon  04/25/2016   LIPOSUCTION     LUMBAR LAMINECTOMY  11/23/2019   foraminotomy, discectomy, facectomy   NEPHRECTOMY Right 2009   PACEMAKER IMPLANT  04/24/2014   RHINOPLASTY     x 2, 1999, 2003   ROTATOR CUFF REPAIR Right    SHOULDER ARTHROSCOPY Right 12/30/2018   x2   TONSILLECTOMY  9024   UMBILICAL HERNIA REPAIR  1998   UPPER GASTROINTESTINAL ENDOSCOPY     WEIL OSTEOTOMY Bilateral 11/27/2019     Medications Prior to Admission  Medication Sig Dispense Refill Last Dose   acyclovir (ZOVIRAX) 400 MG tablet Take 1 tablet (400 mg total) by mouth 2 (two) times daily. 180  tablet 1 05/17/2022   alendronate (FOSAMAX) 70 MG tablet TAKE 1 TABLET(70 MG) BY MOUTH 1 TIME A WEEK (Patient taking differently: Take 70 mg by mouth once a week.) 12 tablet 3 Past Week   ALPRAZolam (XANAX) 1 MG tablet TAKE 1 TABLET(1 MG) BY MOUTH AT BEDTIME AS NEEDED (Patient taking differently: Take 1 mg by mouth daily as needed for anxiety.) 90 tablet 0 Past Week   amLODipine (NORVASC) 10 MG tablet Take 1 tablet (10 mg total) by mouth daily. 90 tablet 0 05/17/2022   atorvastatin (LIPITOR) 80 MG tablet TAKE 1 TABLET(80 MG) BY MOUTH DAILY (Patient taking differently: Take 80 mg by mouth daily.) 90 tablet 0 05/17/2022   Calcium Carbonate-Vit D-Min (CALCIUM 1200 PO) Take 1 tablet by mouth daily.   05/17/2022   CRANBERRY-CALCIUM PO Take 1 tablet by mouth daily.   05/17/2022   estradiol (ESTRACE) 0.5 MG tablet TAKE 1 TABLET BY MOUTH DAILY 90 tablet 0 05/17/2022   losartan (COZAAR) 25 MG tablet TAKE 1 TABLET(25 MG) BY MOUTH DAILY (Patient taking differently: Take 25 mg by mouth daily.) 90 tablet 3 05/17/2022   montelukast (SINGULAIR) 10 MG tablet TAKE 1 TABLET BY MOUTH DAILY AS NEEDED (Patient taking differently: Take 10 mg by mouth daily as needed (breathing).) 90 tablet 0 05/17/2022   ondansetron (ZOFRAN) 4 MG tablet Take 1 tablet (4 mg total) by mouth every 8 (eight) hours as needed for nausea or vomiting. 20 tablet 0 Past Month   OVER THE COUNTER MEDICATION Take 3 tablets by mouth daily. Plesus, 1 tab am and 2 in pm   05/17/2022   prazosin (MINIPRESS) 1 MG capsule TAKE 1 TO 2 CAPSULES(1 TO 2 MG) BY MOUTH AT BEDTIME (Patient taking differently: Take 1 mg by mouth at bedtime.) 90 capsule 1 Past Week   vitamin B-12 (CYANOCOBALAMIN) 1000 MCG tablet Take 1 tablet (1,000 mcg total) by mouth daily. 90 tablet 3 05/17/2022   dicyclomine (BENTYL) 10 MG capsule TAKE 1 CAPSULE(10 MG) BY MOUTH 2 TO 3 TIMES A DAY AS NEEDED FOR ABDOMINAL CRAMPS OR DIARRHEA (Patient not taking: Reported on 05/18/2022) 45 capsule 1 Not  Taking   predniSONE (DELTASONE) 20 MG tablet 3 tabs by mouth daily x 5 days, then 2 tabs by mouth daily x 5 days then 1 tab by mouth daily x 5 days (Patient not taking: Reported on 05/18/2022) 30 tablet 0 Not Taking    Inpatient Medications:   acyclovir  400 mg Oral BID   atorvastatin  80 mg Oral Daily   enoxaparin (LOVENOX) injection  40 mg Subcutaneous Q24H   prazosin  1 mg Oral QHS   sodium chloride flush  3  mL Intravenous Q12H    Allergies:  Allergies  Allergen Reactions   Tape Hives    Tape adhesive long period of time     Social History   Socioeconomic History   Marital status: Married    Spouse name: Legrand Como    Number of children: 4   Years of education: some college   Highest education level: Not on file  Occupational History   Occupation: retired Event organiser  Tobacco Use   Smoking status: Never   Smokeless tobacco: Never  Vaping Use   Vaping Use: Never used  Substance and Sexual Activity   Alcohol use: Never   Drug use: Never   Sexual activity: Not Currently    Birth control/protection: Post-menopausal, Surgical  Other Topics Concern   Not on file  Social History Narrative   06/29/20   From: [place], lived in [place] some, all her children live in [place]   Living: with husband, Legrand Como [phone])   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      Family: 4 adult children - Alroy Dust, [place], Weldon Picking and Maryruth Hancock - 16 grandchildren (has 19 adult step children)      Enjoys: work out, spend time with family, hand work, music      Exercise: foot pain limits - typically weight lifts 2 hours a day   Diet: good, not as good since Production manager belts: Yes    Guns: Yes  and secure   Safe in relationships: Yes    Social Determinants of Health   Financial Resource Strain: Not on file  Food Insecurity: No Food Insecurity (05/19/2022)   Hunger Vital Sign    Worried About Running Out of Food in the Last Year: Never true    [place] in the Last Year: Never true  Transportation Needs: No Transportation Needs (05/19/2022)   PRAPARE - Hydrologist (Medical): No    Lack of Transportation (Non-Medical): No  Physical Activity: Not on file  Stress: Not on file  Social Connections: Not on file  Intimate Partner Violence: Not At Risk (05/19/2022)   Humiliation, Afraid, Rape, and Kick questionnaire    Fear of Current or Ex-Partner: No    Emotionally Abused: No    Physically Abused: No    Sexually Abused: No     Family History  Problem Relation Age of Onset   Diabetes Mother    Parkinson's disease Mother    Alzheimer's disease Mother    Heart disease Father    Alzheimer's disease Father    Hypertension Father    CVA Father    Rheum arthritis Father    Stomach cancer Sister    Hyperlipidemia Sister    Hypertension Sister    Diabetes Sister    Liver disease Sister    Drug abuse Sister    Obesity Sister    Hyperlipidemia Sister    Hypertension Sister    Diabetes Sister    Heart disease Sister    Obesity Sister    Diabetes Sister    Breast cancer Sister 76       x2   Hyperlipidemia Sister    Hypertension Sister    Colon polyps Sister    Obesity Sister    Hyperlipidemia Sister    Hypertension Sister    Obesity Sister    Hyperlipidemia Brother    Hypertension Brother    Heart  disease Brother    Obesity Brother    Diabetes Maternal Aunt    Diabetes Maternal Grandmother    Heart attack Paternal Grandfather    Osteoarthritis Daughter    Obesity Son    Colon cancer Neg Hx    Esophageal cancer Neg Hx      Review of Systems: All other systems reviewed and are otherwise negative except as noted above.  Physical Exam: Vitals:   05/19/22 0240 05/19/22 0245 05/19/22 0446 05/19/22 0845  BP: (!) 107/54 (!) 101/49 (!) 94/59 (!) 108/59  Pulse:   72 73  Resp:      Temp:    97.8 F (36.6 C)  TempSrc:    Oral  SpO2:   94% 98%  Weight:      Height:        GEN-  The patient is well appearing, alert and oriented x 3 today.   HEENT: normocephalic, atraumatic; sclera clear, conjunctiva pink; hearing intact; oropharynx clear; neck supple Lungs- Clear to ausculation bilaterally, normal work of breathing.  No wheezes, rales, rhonchi Heart- Regular rate and rhythm, no murmurs, rubs or gallops GI- soft, non-tender, non-distended, bowel sounds present Extremities- no clubbing, cyanosis, or edema; DP/PT/radial pulses 2+ bilaterally MS- no significant deformity or atrophy Skin- warm and dry, no rash or lesion Psych- euthymic mood, full affect Neuro- strength and sensation are intact  Labs:   Lab Results  Component Value Date   WBC 5.0 05/18/2022   HGB 13.3 05/18/2022   HCT 39.6 05/18/2022   MCV 93.6 05/18/2022   PLT 201 05/18/2022    Recent Labs  Lab 05/18/22 0324  NA 139  K 3.7  CL 106  CO2 23  BUN 15  CREATININE 1.05*  CALCIUM 9.2  PROT 6.2*  BILITOT 0.8  ALKPHOS 40  ALT 48*  AST 33  GLUCOSE 91      Radiology/Studies: ECHOCARDIOGRAM COMPLETE  Result Date: 05/18/2022    ECHOCARDIOGRAM REPORT   Patient Name:   Kim Pennington Date of Exam: 05/18/2022 Medical Rec #:  [id number]      Height:       62.0 in Accession #:    [id number]     Weight:       130.0 lb Date of Birth:  12/20/1955      BSA:          1.592 m Patient Age:    41 years       BP:           129/92 mmHg Patient Gender: F              HR:           64 bpm. Exam Location:  Inpatient Procedure: 2D Echo, 3D Echo, Cardiac Doppler, Color Doppler and Strain Analysis Indications:    Abnormal ECG R94.31  History:        Patient has prior history of Echocardiogram examinations, most                 recent 08/11/2020. Pacemaker, Signs/Symptoms:Syncope and Chest                 Pain; Risk Factors:Hypertension, Non-Smoker and Dyslipidemia.  Sonographer:    Greer Pickerel Referring Phys: [id number] [facility] S KHAN  Sonographer Comments: Global longitudinal strain was attempted. IMPRESSIONS  1. Left  ventricular ejection fraction, by estimation, is 60 to 65%. Left ventricular ejection fraction by 3D volume is 61 %. The left ventricle has normal function. The left  ventricle has no regional wall motion abnormalities. Left ventricular diastolic  parameters were normal. The average left ventricular global longitudinal strain is -18.9 %. The global longitudinal strain is normal.  2. Right ventricular systolic function is normal. The right ventricular size is normal. There is normal pulmonary artery systolic pressure. The estimated right ventricular systolic pressure is 01.0 mmHg.  3. Left atrial size was mildly dilated.  4. The mitral valve is normal in structure. Trivial mitral valve regurgitation. No evidence of mitral stenosis.  5. The aortic valve is normal in structure. Aortic valve regurgitation is not visualized. No aortic stenosis is present.  6. The inferior vena cava is dilated in size with <50% respiratory variability, suggesting right atrial pressure of 15 mmHg. FINDINGS  Left Ventricle: Left ventricular ejection fraction, by estimation, is 60 to 65%. Left ventricular ejection fraction by 3D volume is 61 %. The left ventricle has normal function. The left ventricle has no regional wall motion abnormalities. The average left ventricular global longitudinal strain is -18.9 %. The global longitudinal strain is normal. The left ventricular internal cavity size was normal in size. There is no left ventricular hypertrophy. Left ventricular diastolic parameters were normal. Normal left ventricular filling pressure. Right Ventricle: The right ventricular size is normal. No increase in right ventricular wall thickness. Right ventricular systolic function is normal. There is normal pulmonary artery systolic pressure. The tricuspid regurgitant velocity is 2.20 m/s, and  with an assumed right atrial pressure of 15 mmHg, the estimated right ventricular systolic pressure is 93.2 mmHg. Left Atrium: Left atrial size was  mildly dilated. Right Atrium: Right atrial size was normal in size. Pericardium: Trivial pericardial effusion is present. The pericardial effusion is anterior to the right ventricle. Presence of epicardial fat layer. Mitral Valve: The mitral valve is normal in structure. Trivial mitral valve regurgitation. No evidence of mitral valve stenosis. Tricuspid Valve: The tricuspid valve is normal in structure. Tricuspid valve regurgitation is trivial. No evidence of tricuspid stenosis. Aortic Valve: The aortic valve is normal in structure. Aortic valve regurgitation is not visualized. No aortic stenosis is present. Pulmonic Valve: The pulmonic valve was normal in structure. Pulmonic valve regurgitation is trivial. No evidence of pulmonic stenosis. Aorta: The aortic root is normal in size and structure. Venous: The inferior vena cava is dilated in size with less than 50% respiratory variability, suggesting right atrial pressure of 15 mmHg. IAS/Shunts: No atrial level shunt detected by color flow Doppler.  LEFT VENTRICLE PLAX 2D LVIDd:         4.20 cm         Diastology LVIDs:         3.00 cm         LV e' medial:    8.49 cm/s LV PW:         0.90 cm         LV E/e' medial:  9.6 LV IVS:        0.70 cm         LV e' lateral:   10.00 cm/s LVOT diam:     1.90 cm         LV E/e' lateral: 8.2 LV SV:         57 LV SV Index:   36              2D LVOT Area:     2.84 cm        Longitudinal  Strain                                2D Strain GLS  -20.9 %                                (A2C):                                2D Strain GLS  -19.5 %                                (A3C):                                2D Strain GLS  -16.3 %                                (A4C):                                2D Strain GLS  -18.9 %                                Avg:                                 3D Volume EF                                LV 3D EF:    Left                                             ventricul                                              ar                                             ejection                                             fraction                                             by 3D  volume is                                             61 %.                                 3D Volume EF:                                3D EF:        61 %                                LV EDV:       88 ml                                LV ESV:       34 ml                                LV SV:        54 ml RIGHT VENTRICLE RV S prime:     10.40 cm/s TAPSE (M-mode): 1.8 cm LEFT ATRIUM             Index        RIGHT ATRIUM           Index LA diam:        3.50 cm 2.20 cm/m   RA Area:     14.20 cm LA Vol (A2C):   66.0 ml 41.46 ml/m  RA Volume:   34.80 ml  21.86 ml/m LA Vol (A4C):   44.5 ml 27.95 ml/m LA Biplane Vol: 58.5 ml 36.75 ml/m  AORTIC VALVE             PULMONIC VALVE LVOT Vmax:   82.40 cm/s  PR End Diast Vel: 1.52 msec LVOT Vmean:  57.100 cm/s LVOT VTI:    0.201 m  AORTA Ao Root diam: 3.20 cm Ao Asc diam:  3.30 cm MITRAL VALVE               TRICUSPID VALVE MV Area (PHT): 3.43 cm    TR Peak grad:   19.4 mmHg MV Decel Time: 221 msec    TR Vmax:        220.00 cm/s MV E velocity: 81.70 cm/s MV A velocity: 80.20 cm/s  SHUNTS MV E/A ratio:  1.02        Systemic VTI:  0.20 m                            Systemic Diam: 1.90 cm Fransico Him MD Electronically signed by Fransico Him MD Signature Date/Time: 05/18/2022/10:16:29 AM    Final    CT Angio Chest PE W and/or Wo Contrast  Result Date: 05/18/2022 CLINICAL DATA:  Syncope EXAM: CT ANGIOGRAPHY CHEST WITH CONTRAST TECHNIQUE: Multidetector CT imaging of the chest was performed using the standard protocol during bolus administration of intravenous contrast. Multiplanar CT image reconstructions and MIPs were obtained to evaluate the vascular anatomy. RADIATION DOSE  REDUCTION: This exam was performed according to the  departmental dose-optimization program which includes automated exposure control, adjustment of the mA and/or kV according to patient size and/or use of iterative reconstruction technique. CONTRAST:  58m OMNIPAQUE IOHEXOL 350 MG/ML SOLN COMPARISON:  CT abdomen pelvis 04/08/2020 FINDINGS: Cardiovascular: Contrast injection is sufficient to demonstrate satisfactory opacification of the pulmonary arteries to the segmental level. There is no pulmonary embolus or evidence of right heart strain. The size of the main pulmonary artery is normal. Heart size is normal, with no pericardial effusion. The course and caliber of the aorta are normal. There is no atherosclerotic calcification. Opacification decreased due to pulmonary arterial phase contrast bolus timing. Mediastinum/Nodes: No mediastinal, hilar or axillary lymphadenopathy. Normal visualized thyroid. Thoracic esophageal course is normal. Lungs/Pleura: Airways are patent. No pleural effusion, lobar consolidation, pneumothorax or pulmonary infarction. Upper Abdomen: Contrast bolus timing is not optimized for evaluation of the abdominal organs. Left hydronephrosis is unchanged compared to 04/08/2020. Musculoskeletal: No chest wall abnormality. No bony spinal canal stenosis. Review of the MIP images confirms the above findings. IMPRESSION: 1. No pulmonary embolus or acute aortic syndrome. 2. Left hydronephrosis is unchanged since 04/08/2020. Electronically Signed   By: KUlyses JarredM.D.   On: 05/18/2022 01:58   DG Chest 2 View  Result Date: 05/17/2022 CLINICAL DATA:  Chest pain EXAM: CHEST - 2 VIEW COMPARISON:  05/19/2020 FINDINGS: There is interval increase in transverse diameter of heart. There are no signs of pulmonary edema or focal pulmonary consolidation. There is no pleural effusion or pneumothorax. Pacemaker battery is seen in left infraclavicular region. No significant interval changes are noted in the lung fields. IMPRESSION: There are no focal  infiltrates or signs of pulmonary edema. Electronically Signed   By: PElmer PickerM.D.   On: 05/17/2022 19:26   CUP PACEART REMOTE DEVICE CHECK  Result Date: 05/16/2022 Scheduled remote reviewed. Normal device function.  Next remote 91 days- JJB   EKG: (personally reviewed)  TELEMETRY: AP VS 70s (personally reviewed)  DEVICE HISTORY: DDD Abbott PPM 2015  Assessment/Plan: 1.  Dysautonomia 2. H/o Syncope 3. Likely hypervagatonia Device interrogation unremarkable, without significant arrhyhtmia and with appropriate histograms.  We discussed the role of salt and water repletion.  We discussed the awareness of triggers, and needing to become supine as quickly as is safe to do so when she has symptoms.  She should not drive for the time being.  Recommend compression garments, especially those centric on the thighs and abdomen, including Spanx compressive wear as an option.   3. HTN Agree with holding amlodipine and losartan for now Would give her more BP room. Fine to have BPs mostly in 130-140s; perhaps at back gentle control if regularly higher than that.   For questions or updates, please contact CGold Key LakePlease consult [email or web address] for contact info under Cardiology/STEMI.  SJacalyn Lefevre PA-C  05/19/2022 9:59 AM

## 2022-05-19 NOTE — Progress Notes (Signed)
Progress Note  Patient Name: Kim [facility] Date of Encounter: 05/19/2022  Primary Cardiologist:   Vickie Epley, MD   Subjective   Hypotension and presyncope noted yesterday.    Inpatient Medications    Scheduled Meds:  acyclovir  400 mg Oral BID   atorvastatin  80 mg Oral Daily   enoxaparin (LOVENOX) injection  40 mg Subcutaneous Q24H   losartan  25 mg Oral Daily   prazosin  1 mg Oral QHS   sodium chloride flush  3 mL Intravenous Q12H   Continuous Infusions:  sodium chloride 75 mL/hr at 05/18/22 1821   promethazine (PHENERGAN) injection (IM or IVPB) Stopped (05/18/22 1824)   PRN Meds: acetaminophen **OR** acetaminophen, ALPRAZolam, melatonin, ondansetron (ZOFRAN) IV, pneumococcal 20-valent conjugate vaccine, promethazine (PHENERGAN) injection (IM or IVPB)   Vital Signs    Vitals:   05/19/22 0230 05/19/22 0240 05/19/22 0245 05/19/22 0446  BP: (!) 105/57 (!) 107/54 (!) 101/49 (!) 94/59  Pulse:    72  Resp:      Temp:      TempSrc:      SpO2:    94%  Weight:      Height:        Intake/Output Summary (Last 24 hours) at 05/19/2022 0829 Last data filed at 05/19/2022 0657 Gross per 24 hour  Intake 2687.31 ml  Output 650 ml  Net 2037.31 ml   Filed Weights   05/17/22 1837 05/19/22 0037  Weight: 59 kg 61 kg    Telemetry    NSR - Personally Reviewed  ECG    NSR, rate 67, no acute ST T wave changes.  - Personally Reviewed  Physical Exam   GEN: No acute distress.   Neck: No  JVD Cardiac: RRR, no murmurs, rubs, or gallops.  Respiratory: Clear  to auscultation bilaterally. GI: Soft, nontender, non-distended  MS: No  edema; No deformity. Neuro:  Nonfocal  Psych: Normal affect   Labs    Chemistry Recent Labs  Lab 05/17/22 1848 05/18/22 0324  NA 140 139  K 4.2 3.7  CL 104 106  CO2 25 23  GLUCOSE 102* 91  BUN 17 15  CREATININE 1.16* 1.05*  CALCIUM 10.0 9.2  PROT 7.3 6.2*  ALBUMIN 4.3 3.8  AST 40 33  ALT 53* 48*  ALKPHOS 46 40   BILITOT 0.5 0.8  GFRNONAA 52* 59*  ANIONGAP 11 10     Hematology Recent Labs  Lab 05/17/22 1848 05/18/22 0324  WBC 6.3 5.0  RBC 4.46 4.23  HGB 14.2 13.3  HCT 41.7 39.6  MCV 93.5 93.6  MCH 31.8 31.4  MCHC 34.1 33.6  RDW 12.9 13.0  PLT 221 201    Cardiac EnzymesNo results for input(s): "TROPONINI" in the last 168 hours. No results for input(s): "TROPIPOC" in the last 168 hours.   BNPNo results for input(s): "BNP", "PROBNP" in the last 168 hours.   DDimer No results for input(s): "DDIMER" in the last 168 hours.   Radiology    ECHOCARDIOGRAM COMPLETE  Result Date: 05/18/2022    ECHOCARDIOGRAM REPORT   Patient Name:   Kim Penning[facility] Date of Exam: 05/18/2022 Medical Rec #:  [id number]      Height:       62.0 in Accession #:    [id number]     Weight:       130.0 lb Date of Birth:  [birth year]      BSA:          1.592  m Patient Age:    61 years       BP:           129/92 mmHg Patient Gender: F              HR:           64 bpm. Exam Location:  Inpatient Procedure: 2D Echo, 3D Echo, Cardiac Doppler, Color Doppler and Strain Analysis Indications:    Abnormal ECG R94.31  History:        Patient has prior history of Echocardiogram examinations, most                 recent 08/11/2020. Pacemaker, Signs/Symptoms:Syncope and Chest                 Pain; Risk Factors:Hypertension, Non-Smoker and Dyslipidemia.  Sonographer:    Greer Pickerel Referring Phys: [id number] [facility] S KHAN  Sonographer Comments: Global longitudinal strain was attempted. IMPRESSIONS  1. Left ventricular ejection fraction, by estimation, is 60 to 65%. Left ventricular ejection fraction by 3D volume is 61 %. The left ventricle has normal function. The left ventricle has no regional wall motion abnormalities. Left ventricular diastolic  parameters were normal. The average left ventricular global longitudinal strain is -18.9 %. The global longitudinal strain is normal.  2. Right ventricular systolic function is normal. The right  ventricular size is normal. There is normal pulmonary artery systolic pressure. The estimated right ventricular systolic pressure is 16.6 mmHg.  3. Left atrial size was mildly dilated.  4. The mitral valve is normal in structure. Trivial mitral valve regurgitation. No evidence of mitral stenosis.  5. The aortic valve is normal in structure. Aortic valve regurgitation is not visualized. No aortic stenosis is present.  6. The inferior vena cava is dilated in size with <50% respiratory variability, suggesting right atrial pressure of 15 mmHg. FINDINGS  Left Ventricle: Left ventricular ejection fraction, by estimation, is 60 to 65%. Left ventricular ejection fraction by 3D volume is 61 %. The left ventricle has normal function. The left ventricle has no regional wall motion abnormalities. The average left ventricular global longitudinal strain is -18.9 %. The global longitudinal strain is normal. The left ventricular internal cavity size was normal in size. There is no left ventricular hypertrophy. Left ventricular diastolic parameters were normal. Normal left ventricular filling pressure. Right Ventricle: The right ventricular size is normal. No increase in right ventricular wall thickness. Right ventricular systolic function is normal. There is normal pulmonary artery systolic pressure. The tricuspid regurgitant velocity is 2.20 m/s, and  with an assumed right atrial pressure of 15 mmHg, the estimated right ventricular systolic pressure is 06.3 mmHg. Left Atrium: Left atrial size was mildly dilated. Right Atrium: Right atrial size was normal in size. Pericardium: Trivial pericardial effusion is present. The pericardial effusion is anterior to the right ventricle. Presence of epicardial fat layer. Mitral Valve: The mitral valve is normal in structure. Trivial mitral valve regurgitation. No evidence of mitral valve stenosis. Tricuspid Valve: The tricuspid valve is normal in structure. Tricuspid valve regurgitation is  trivial. No evidence of tricuspid stenosis. Aortic Valve: The aortic valve is normal in structure. Aortic valve regurgitation is not visualized. No aortic stenosis is present. Pulmonic Valve: The pulmonic valve was normal in structure. Pulmonic valve regurgitation is trivial. No evidence of pulmonic stenosis. Aorta: The aortic root is normal in size and structure. Venous: The inferior vena cava is dilated in size with less than 50% respiratory variability, suggesting right atrial  pressure of 15 mmHg. IAS/Shunts: No atrial level shunt detected by color flow Doppler.  LEFT VENTRICLE PLAX 2D LVIDd:         4.20 cm         Diastology LVIDs:         3.00 cm         LV e' medial:    8.49 cm/s LV PW:         0.90 cm         LV E/e' medial:  9.6 LV IVS:        0.70 cm         LV e' lateral:   10.00 cm/s LVOT diam:     1.90 cm         LV E/e' lateral: 8.2 LV SV:         57 LV SV Index:   36              2D LVOT Area:     2.84 cm        Longitudinal                                Strain                                2D Strain GLS  -20.9 %                                (A2C):                                2D Strain GLS  -19.5 %                                (A3C):                                2D Strain GLS  -16.3 %                                (A4C):                                2D Strain GLS  -18.9 %                                Avg:                                 3D Volume EF                                LV 3D EF:    Left  ventricul                                             ar                                             ejection                                             fraction                                             by 3D                                             volume is                                             61 %.                                 3D Volume EF:                                3D EF:        61 %                                LV EDV:        88 ml                                LV ESV:       34 ml                                LV SV:        54 ml RIGHT VENTRICLE RV S prime:     10.40 cm/s TAPSE (M-mode): 1.8 cm LEFT ATRIUM             Index        RIGHT ATRIUM           Index LA diam:        3.50 cm 2.20 cm/m   RA Area:     14.20 cm LA Vol (A2C):   66.0 ml 41.46 ml/m  RA Volume:   34.80 ml  21.86 ml/m LA Vol (A4C):   44.5 ml 27.95 ml/m LA Biplane Vol: 58.5 ml 36.75 ml/m  AORTIC VALVE             PULMONIC  VALVE LVOT Vmax:   82.40 cm/s  PR End Diast Vel: 1.52 msec LVOT Vmean:  57.100 cm/s LVOT VTI:    0.201 m  AORTA Ao Root diam: 3.20 cm Ao Asc diam:  3.30 cm MITRAL VALVE               TRICUSPID VALVE MV Area (PHT): 3.43 cm    TR Peak grad:   19.4 mmHg MV Decel Time: 221 msec    TR Vmax:        220.00 cm/s MV E velocity: 81.70 cm/s MV A velocity: 80.20 cm/s  SHUNTS MV E/A ratio:  1.02        Systemic VTI:  0.20 m                            Systemic Diam: 1.90 cm Fransico Him MD Electronically signed by Fransico Him MD Signature Date/Time: 05/18/2022/10:16:29 AM    Final    CT Angio Chest PE W and/or Wo Contrast  Result Date: 05/18/2022 CLINICAL DATA:  Syncope EXAM: CT ANGIOGRAPHY CHEST WITH CONTRAST TECHNIQUE: Multidetector CT imaging of the chest was performed using the standard protocol during bolus administration of intravenous contrast. Multiplanar CT image reconstructions and MIPs were obtained to evaluate the vascular anatomy. RADIATION DOSE REDUCTION: This exam was performed according to the departmental dose-optimization program which includes automated exposure control, adjustment of the mA and/or kV according to patient size and/or use of iterative reconstruction technique. CONTRAST:  84m OMNIPAQUE IOHEXOL 350 MG/ML SOLN COMPARISON:  CT abdomen pelvis 04/08/2020 FINDINGS: Cardiovascular: Contrast injection is sufficient to demonstrate satisfactory opacification of the pulmonary arteries to the segmental level. There is no  pulmonary embolus or evidence of right heart strain. The size of the main pulmonary artery is normal. Heart size is normal, with no pericardial effusion. The course and caliber of the aorta are normal. There is no atherosclerotic calcification. Opacification decreased due to pulmonary arterial phase contrast bolus timing. Mediastinum/Nodes: No mediastinal, hilar or axillary lymphadenopathy. Normal visualized thyroid. Thoracic esophageal course is normal. Lungs/Pleura: Airways are patent. No pleural effusion, lobar consolidation, pneumothorax or pulmonary infarction. Upper Abdomen: Contrast bolus timing is not optimized for evaluation of the abdominal organs. Left hydronephrosis is unchanged compared to 04/08/2020. Musculoskeletal: No chest wall abnormality. No bony spinal canal stenosis. Review of the MIP images confirms the above findings. IMPRESSION: 1. No pulmonary embolus or acute aortic syndrome. 2. Left hydronephrosis is unchanged since 04/08/2020. Electronically Signed   By: KUlyses JarredM.D.   On: 05/18/2022 01:58   DG Chest 2 View  Result Date: 05/17/2022 CLINICAL DATA:  Chest pain EXAM: CHEST - 2 VIEW COMPARISON:  05/19/2020 FINDINGS: There is interval increase in transverse diameter of heart. There are no signs of pulmonary edema or focal pulmonary consolidation. There is no pleural effusion or pneumothorax. Pacemaker battery is seen in left infraclavicular region. No significant interval changes are noted in the lung fields. IMPRESSION: There are no focal infiltrates or signs of pulmonary edema. Electronically Signed   By: PElmer PickerM.D.   On: 05/17/2022 19:26    Cardiac Studies   ECHO:  1. Left ventricular ejection fraction, by estimation, is 60 to 65%. Left  ventricular ejection fraction by 3D volume is 61 %. The left ventricle has  normal function. The left ventricle has no regional wall motion  abnormalities. Left ventricular diastolic   parameters were normal. The average  left ventricular global longitudinal  strain is -18.9 %. The global longitudinal strain is normal.   2. Right ventricular systolic function is normal. The right ventricular  size is normal. There is normal pulmonary artery systolic pressure. The  estimated right ventricular systolic pressure is 93.5 mmHg.   3. Left atrial size was mildly dilated.   4. The mitral valve is normal in structure. Trivial mitral valve  regurgitation. No evidence of mitral stenosis.   5. The aortic valve is normal in structure. Aortic valve regurgitation is  not visualized. No aortic stenosis is present.   6. The inferior vena cava is dilated in size with <50% respiratory  variability, suggesting right atrial pressure of 15 mmHg.    Patient Profile     66 y.o. female with a history of syncope and PPM placement  Assessment & Plan    Syncope:   Vasodepressor event again last evening.  I will hold Cozaar and Norvasc for now.    Repeat orthostatic BPs.  Patient will orally hydrate and be liberal with salt.  I have advised Spanks and follow BP today to see if there are further events.  She did get her Norvasc and Cozaar yesterday it looks like.    For questions or updates, please contact [place] Please consult [email or web address] for contact info under Cardiology/STEMI.   Signed, Minus Breeding, MD  05/19/2022, 8:29 AM

## 2022-05-19 NOTE — Consult Note (Signed)
   [facility] Inpatient Consult   05/19/2022  Etna Forquer [birth year] [id number]  [place] [facility] [ACO] Patient: Kim Pen[facility]   Primary Care Provider:  no PCP listed, patient was with [facility] at [facility]  Patient screened for hospitalization with noted to assess for potential [place] Management service needs for post hospital transition for care coordination.  Review of patient's electronic medical record reveals patient is in need of a TOC follow up.  9:55 am Met with the patient at the bedside regarding PCP follow up as her PCP is no longer at [facility], she did make an appointment next year to establish care with [employer].  Spoke with inpatient unit secretary and she was able to get a hospital follow up for May 31, 2026 @ 10 AM at [facility].  Plan:  Continue to follow progress and disposition to assess for post hospital community care coordination/management needs.  Referral request for community care coordination:  follow up requested  Of note, [place] does not replace or interfere with any arrangements made by the Inpatient Transition of Care team.  For questions contact:   Natividad Brood, RN BSN [place]  [phone] business mobile phone Toll free office [phone]  *[place]  [phone] Fax number: [phone] [email or web address]

## 2022-05-19 NOTE — Care Management Obs Status (Signed)
[place] NOTIFICATION   Patient Details  Name: Kim [facility] MRN: [id number] Date of Birth: Apr 23, 1956   Medicare Observation Status Notification Given:  Yes    Zenon Mayo, RN 05/19/2022, 1:50 PM

## 2022-05-19 NOTE — TOC Progression Note (Signed)
Transition of Care [facility]) - Progression Note    Patient Details  Name: Kim [facility] MRN: [id number] Date of Birth: 1956-02-09  Transition of Care [facility]) CM/SW Contact  Zenon Mayo, RN Phone Number: 05/19/2022, 8:48 AM  Clinical Narrative:    From home, presents with syncope, PAF and CHF.  IVF going at 6.  Cards to see. TOC following.         Expected Discharge Plan and Services                                                 Social Determinants of Health (SDOH) Interventions    Readmission Risk Interventions     No data to display

## 2022-05-19 NOTE — Telephone Encounter (Addendum)
Pt currently admitted    I don't see any true sustained AF episodes. These are incidental atrial high rate episodes (AHRE). I would not anticoagulate for this unless the episodes become much more sustained. These would not explain any syncope or presyncope.  Let me know if any other questions/concerns.  Thanks, Lars Mage

## 2022-05-19 NOTE — Plan of Care (Signed)
PT A&O X4, able to make needs known. No s/s distress. Anticipated discharge today from beginning of shift. Denies pain. Had minor nausea this afternoon, given IV Zofran with good results. Ambulated in hall prior to discharge, tolerated activity well. Showering then planning to leave. Family member with her.  Problem: Education: Goal: Knowledge of General Education information will improve Description: Including pain rating scale, medication(s)/side effects and non-pharmacologic comfort measures Outcome: Adequate for Discharge   Problem: Health Behavior/Discharge Planning: Goal: Ability to manage health-related needs will improve Outcome: Adequate for Discharge   Problem: Clinical Measurements: Goal: Ability to maintain clinical measurements within normal limits will improve Outcome: Adequate for Discharge Goal: Will remain free from infection Outcome: Adequate for Discharge Goal: Diagnostic test results will improve Outcome: Adequate for Discharge Goal: Respiratory complications will improve Outcome: Adequate for Discharge Goal: Cardiovascular complication will be avoided Outcome: Adequate for Discharge   Problem: Activity: Goal: Risk for activity intolerance will decrease Outcome: Adequate for Discharge   Problem: Nutrition: Goal: Adequate nutrition will be maintained Outcome: Adequate for Discharge   Problem: Coping: Goal: Level of anxiety will decrease Outcome: Adequate for Discharge   Problem: Elimination: Goal: Will not experience complications related to bowel motility Outcome: Adequate for Discharge Goal: Will not experience complications related to urinary retention Outcome: Adequate for Discharge   Problem: Pain Managment: Goal: General experience of comfort will improve Outcome: Adequate for Discharge   Problem: Safety: Goal: Ability to remain free from injury will improve Outcome: Adequate for Discharge   Problem: Skin Integrity: Goal: Risk for impaired  skin integrity will decrease Outcome: Adequate for Discharge

## 2022-05-22 ENCOUNTER — Telehealth: Payer: Self-pay | Admitting: *Deleted

## 2022-05-22 ENCOUNTER — Telehealth: Payer: Self-pay

## 2022-05-22 ENCOUNTER — Other Ambulatory Visit: Payer: Self-pay

## 2022-05-22 DIAGNOSIS — N1831 Chronic kidney disease, stage 3a: Secondary | ICD-10-CM

## 2022-05-22 DIAGNOSIS — I5032 Chronic diastolic (congestive) heart failure: Secondary | ICD-10-CM

## 2022-05-22 NOTE — Progress Notes (Addendum)
Chronic Care Management Pharmacy Assistant   Name: Ahliya Glatt  MRN: [id number] DOB: 25-Dec-1955  Reason for Encounter: Non-CCM [facility] Follow-Up)  Medications: Outpatient Encounter Medications as of 05/22/2022  Medication Sig Note   acyclovir (ZOVIRAX) 400 MG tablet Take 1 tablet (400 mg total) by mouth 2 (two) times daily.    alendronate (FOSAMAX) 70 MG tablet TAKE 1 TABLET(70 MG) BY MOUTH 1 TIME A WEEK (Patient taking differently: Take 70 mg by mouth once a week.) 05/18/2022: Mondays    ALPRAZolam (XANAX) 1 MG tablet TAKE 1 TABLET(1 MG) BY MOUTH AT BEDTIME AS NEEDED (Patient taking differently: Take 1 mg by mouth daily as needed for anxiety.)    atorvastatin (LIPITOR) 80 MG tablet TAKE 1 TABLET(80 MG) BY MOUTH DAILY (Patient taking differently: Take 80 mg by mouth daily.)    Calcium Carbonate-Vit D-Min (CALCIUM 1200 PO) Take 1 tablet by mouth daily.    CRANBERRY-CALCIUM PO Take 1 tablet by mouth daily.    estradiol (ESTRACE) 0.5 MG tablet TAKE 1 TABLET BY MOUTH DAILY 05/18/2022: Pt is trying to taper off of this   montelukast (SINGULAIR) 10 MG tablet TAKE 1 TABLET BY MOUTH DAILY AS NEEDED (Patient taking differently: Take 10 mg by mouth daily as needed (breathing).)    ondansetron (ZOFRAN) 4 MG tablet Take 1 tablet (4 mg total) by mouth every 8 (eight) hours as needed for nausea or vomiting.    OVER THE COUNTER MEDICATION Take 3 tablets by mouth daily. Plesus, 1 tab am and 2 in pm    prazosin (MINIPRESS) 1 MG capsule Take 1 capsule (1 mg total) by mouth at bedtime.    vitamin B-12 (CYANOCOBALAMIN) 1000 MCG tablet Take 1 tablet (1,000 mcg total) by mouth daily.    No facility-administered encounter medications on file as of 05/22/2022.     Reviewed hospital notes for details of recent visit. Has patient been contacted by Transitions of Care team? No Has patient seen PCP/specialist for hospital follow up (summarize OV if yes): No  Admitted to the hospital on 05/17/2022.  Discharge date was 05/19/2022.  Discharged from [facility].   Discharge diagnosis (Principal Problem): Recurrent syncopal episodes Patient was discharged to Home  Brief summary of hospital course: 66 year old married female, PMH of HTN, HLD, SSS s/p PPM, CKD stage III, s/p right nephrectomy, hepatitis B, anemia, lymphocytic colitis, presented to the ED on 05/18/2022 following recurrent syncopal episodes.  As per cardiology consultation, patient had been okay up until 3 days PTA.  She reported a sense of diaphoresis and an odd sensation followed by multiple episodes of syncope.  Her device was interrogated remotely on 12/11, there were no arrhythmias or device malfunction to explain these episodes.  She had labile blood pressures with SBP in the 70s while standing, walk to tell her husband and then had a frank syncope.  She reports being physically active with 2 hours of weightlifting daily and aerobic exercises well.   She was admitted for further evaluation and management.  Both cardiology and EP cardiology consulted.  Troponins x 3 negative.  CMP and CBC unremarkable.  TSH normal.  UA not suggestive of UTI.  CTA chest without pulmonary embolus or acute aortic syndrome.  Unchanged left hydronephrosis since 2021.  TTE showed LVEF 56-38%, normal diastolic function and no aortic stenosis.  Overnight of admission, when nursing were attempting to take static vital signs, patient did not feel well, sat down on the side of the bed, patient noted to be  diaphoretic and not responding, placed back in the bed and vital signs reveal SBP in the 60s.  She was placed in Trendelenburg position.  Rapid response was activated.  By the time rapid response arrived, she was alert and oriented.  Reported some headache/dizziness.  BP was up to 109/64.  She was bolused with IV fluids and maintenance IV fluids were continued.  Subsequent blood pressure checks earlier this morning showed soft blood pressures without  orthostatic changes.  Cardiology saw her this morning, felt that she had a vasodepressor event last evening.  Cozaar and amlodipine were discontinued.  She was counseled to orally hydrate and liberalize salt intake.  She was also advised spanks.  Patient was then evaluated by EP cardiology who felt that she had dysautonomia, likely hyper vagotonia.  They indicated that device interrogation was unremarkable.  They discussed with her and daughter at bedside the awareness of triggers and needing to become supine as quickly as a safe to do so when she has symptoms, recommended compression garments especially those centric on the thighs and abdomen.  Both cardiology and EP cardiology cleared her for discharge home.  She and daughter at bedside were specifically counseled by this MD that she should not drive for at least 6 months and they verbalized understanding.   New?Medications Started at [facility] Discharge:?? None noted   Medication Changes at Hospital Discharge: -Changed prazosin (MINIPRESS) 1 MG capsule   Medications Discontinued at Hospital Discharge: -Stopped amLODipine 10 MG tablet  -Stopped dicyclomine 10 MG capsule -Stopped losartan 25 MG tablet -Stopped predniSONE 20 MG tablet  Medications that remain the same after Hospital Discharge:??  -All other medications will remain the same.    Next CCM appt: Non-CCM  Other upcoming appts: PCP appointment on 12/27/2022 for Physical  Charlene Brooke, PharmD notified and will determine if action is needed.  Marijean Niemann, [place] Pharmacy Assistant [phone]   Pharmacist addendum: Reviewed chart. Pt has seen new PCP at [facility] location. No PharmD intervention warranted.  Charlene Brooke, PharmD, BCACP 06/02/22 11:39 AM

## 2022-05-22 NOTE — Progress Notes (Signed)
  Care Coordination   Note   05/22/2022 Name: Trenace Coughlin MRN: [id number] DOB: 07-30-1955  Lajuanda Penick is a 66 y.o. year old female who sees No primary care provider on file. for primary care. I reached out to Maren Reamer by phone today to offer care coordination services.  Ms. Mello was given information about Care Coordination services today including:   The Care Coordination services include support from the care team which includes your Nurse Coordinator, Clinical Social Worker, or Pharmacist.  The Care Coordination team is here to help remove barriers to the health concerns and goals most important to you. Care Coordination services are voluntary, and the patient may decline or stop services at any time by request to their care team member.   Care Coordination Consent Status: Patient agreed to services and verbal consent obtained.   Follow up plan:  Telephone appointment with care coordination team member scheduled for:  05/23/2022  Encounter Outcome:  Pt. Scheduled from referral   Julian Hy, [place] Direct Dial: [phone]

## 2022-05-23 ENCOUNTER — Ambulatory Visit: Payer: Self-pay

## 2022-05-23 NOTE — Telephone Encounter (Signed)
Left message to call back.   Vickie Epley, MD  You2 days ago    OK to stop. Thanks, Lars Mage

## 2022-05-25 NOTE — Telephone Encounter (Signed)
Pt updated with MD's recommendations and verbalized understanding.  

## 2022-05-31 ENCOUNTER — Ambulatory Visit (INDEPENDENT_AMBULATORY_CARE_PROVIDER_SITE_OTHER): Payer: Medicare Other | Admitting: Nurse Practitioner

## 2022-05-31 ENCOUNTER — Encounter: Payer: Self-pay | Admitting: Nurse Practitioner

## 2022-05-31 VITALS — BP 118/82 | HR 71 | Temp 98.0°F | Ht 63.0 in | Wt 134.0 lb

## 2022-05-31 DIAGNOSIS — I10 Essential (primary) hypertension: Secondary | ICD-10-CM

## 2022-05-31 DIAGNOSIS — R55 Syncope and collapse: Secondary | ICD-10-CM

## 2022-05-31 DIAGNOSIS — N1831 Chronic kidney disease, stage 3a: Secondary | ICD-10-CM

## 2022-05-31 LAB — CBC WITH DIFFERENTIAL/PLATELET
Basophils Absolute: 0 10*3/uL (ref 0.0–0.1)
Basophils Relative: 1.1 % (ref 0.0–3.0)
Eosinophils Absolute: 0.3 10*3/uL (ref 0.0–0.7)
Eosinophils Relative: 5.7 % — ABNORMAL HIGH (ref 0.0–5.0)
HCT: 40.6 % (ref 36.0–46.0)
Hemoglobin: 13.7 g/dL (ref 12.0–15.0)
Lymphocytes Relative: 38.1 % (ref 12.0–46.0)
Lymphs Abs: 1.7 10*3/uL (ref 0.7–4.0)
MCHC: 33.9 g/dL (ref 30.0–36.0)
MCV: 94.4 fl (ref 78.0–100.0)
Monocytes Absolute: 0.5 10*3/uL (ref 0.1–1.0)
Monocytes Relative: 10.3 % (ref 3.0–12.0)
Neutro Abs: 2.1 10*3/uL (ref 1.4–7.7)
Neutrophils Relative %: 44.8 % (ref 43.0–77.0)
Platelets: 232 10*3/uL (ref 150.0–400.0)
RBC: 4.3 Mil/uL (ref 3.87–5.11)
RDW: 13.4 % (ref 11.5–15.5)
WBC: 4.6 10*3/uL (ref 4.0–10.5)

## 2022-05-31 LAB — BASIC METABOLIC PANEL
BUN: 20 mg/dL (ref 6–23)
CO2: 28 mEq/L (ref 19–32)
Calcium: 9.4 mg/dL (ref 8.4–10.5)
Chloride: 103 mEq/L (ref 96–112)
Creatinine, Ser: 1.14 mg/dL (ref 0.40–1.20)
GFR: 50.53 mL/min — ABNORMAL LOW (ref >60.00)
Glucose, Bld: 84 mg/dL (ref 70–99)
Potassium: 4.1 mEq/L (ref 3.5–5.1)
Sodium: 138 mEq/L (ref 135–145)

## 2022-05-31 NOTE — Assessment & Plan Note (Addendum)
Stable. Will repeat BMP.

## 2022-05-31 NOTE — Assessment & Plan Note (Signed)
Stable. Home BP's from patient reviewed: 100-120/70-90. Medications reviewed with patient. Losartan and Amlodipine discontinued by hospital. Continue Prazosin '1mg'$  daily at bedtime. Follow up with Cardiology as scheduled.

## 2022-05-31 NOTE — Assessment & Plan Note (Addendum)
Patient able to identify some triggers. Discussed safety and laying down if lightheaded or dizziness occurs. Will continue to monitor blood pressure and follow up with Cardiology. Will repeat CBC and BMP today.

## 2022-05-31 NOTE — Progress Notes (Signed)
[name], NP-C Phone: [phone]  Kim Pennington is a 66 y.o. female who presents today for hospital follow up.   Patient was hospitalized from 12/13-12/15 due to syncopal episodes.   Syncope- Patient reports improved symptoms. She has been able to identify some of her triggers. She is traveling with a blanket to lay down if she feels she is going to pass out. Reports she has not had any syncopal episodes since she has been discharged. She has been dizzy and lightheaded but is able to lay down before she passes out. She denies both at this time. She has an appointment with Cardiology next month.   HYPERTENSION Disease Monitoring Home BP Monitoring 100-120/70-90 Chest pain- No    Dyspnea- No Medications Compliance-  Yes. Lightheadedness-  Occasional  Edema- Mild in evenings BMET    Component Value Date/Time   NA 139 05/18/2022 0324   K 3.7 05/18/2022 0324   CL 106 05/18/2022 0324   CO2 23 05/18/2022 0324   GLUCOSE 91 05/18/2022 0324   BUN 15 05/18/2022 0324   CREATININE 1.05 (H) 05/18/2022 0324   CALCIUM 9.2 05/18/2022 0324   GFRNONAA 59 (L) 05/18/2022 0324     Social History   Tobacco Use  Smoking Status Never  Smokeless Tobacco Never    Current Outpatient Medications on File Prior to Visit  Medication Sig Dispense Refill   acyclovir (ZOVIRAX) 400 MG tablet Take 1 tablet (400 mg total) by mouth 2 (two) times daily. 180 tablet 1   alendronate (FOSAMAX) 70 MG tablet TAKE 1 TABLET(70 MG) BY MOUTH 1 TIME A WEEK (Patient taking differently: Take 70 mg by mouth once a week.) 12 tablet 3   ALPRAZolam (XANAX) 1 MG tablet TAKE 1 TABLET(1 MG) BY MOUTH AT BEDTIME AS NEEDED (Patient taking differently: Take 1 mg by mouth daily as needed for anxiety.) 90 tablet 0   atorvastatin (LIPITOR) 80 MG tablet TAKE 1 TABLET(80 MG) BY MOUTH DAILY (Patient taking differently: Take 80 mg by mouth daily.) 90 tablet 0   Calcium Carbonate-Vit D-Min (CALCIUM 1200 PO) Take 1 tablet by mouth daily.      CRANBERRY-CALCIUM PO Take 1 tablet by mouth daily.     estradiol (ESTRACE) 0.5 MG tablet TAKE 1 TABLET BY MOUTH DAILY (Patient not taking: Reported on 05/23/2022) 90 tablet 0   montelukast (SINGULAIR) 10 MG tablet TAKE 1 TABLET BY MOUTH DAILY AS NEEDED (Patient taking differently: Take 10 mg by mouth daily as needed (breathing).) 90 tablet 0   ondansetron (ZOFRAN) 4 MG tablet Take 1 tablet (4 mg total) by mouth every 8 (eight) hours as needed for nausea or vomiting. 20 tablet 0   OVER THE COUNTER MEDICATION Take 3 tablets by mouth daily. Plesus, 1 tab am and 2 in pm     prazosin (MINIPRESS) 1 MG capsule Take 1 capsule (1 mg total) by mouth at bedtime.     vitamin B-12 (CYANOCOBALAMIN) 1000 MCG tablet Take 1 tablet (1,000 mcg total) by mouth daily. 90 tablet 3   No current facility-administered medications on file prior to visit.     ROS see history of present illness  Objective  Physical Exam Vitals:   05/31/22 1025  BP: 118/82  Pulse: 71  Temp: 98 F (36.7 C)  SpO2: 97%    BP Readings from Last 3 Encounters:  05/31/22 118/82  05/19/22 (!) 108/59  01/20/22 116/76   Wt Readings from Last 3 Encounters:  05/31/22 134 lb (60.8 kg)  05/19/22 134 lb  6.4 oz (61 kg)  01/20/22 137 lb (62.1 kg)    Physical Exam Constitutional:      Appearance: Normal appearance.  Cardiovascular:     Rate and Rhythm: Normal rate and regular rhythm.     Heart sounds: Normal heart sounds.  Pulmonary:     Effort: Pulmonary effort is normal.     Breath sounds: Normal breath sounds.  Neurological:     Mental Status: She is alert.    Assessment/Plan: Please see individual problem list.  Vasovagal syncope Assessment & Plan: Patient able to identify some triggers. Discussed safety and laying down if lightheaded or dizziness occurs. Will continue to monitor blood pressure and follow up with Cardiology. Will repeat CBC and BMP today.    Primary hypertension Assessment & Plan: Stable. Home  BP's from patient reviewed: 100-120/70-90. Medications reviewed with patient. Losartan and Amlodipine discontinued by hospital. Continue Prazosin '1mg'$  daily at bedtime. Follow up with Cardiology as scheduled.   Orders: -     CBC with Differential/Platelet  Chronic renal impairment, stage 3a ([place]) Assessment & Plan: Stable. Will repeat BMP.   Orders: -     Basic metabolic panel   Hospital notes, discharge summary, and labs all reviewed.   Return in about 3 months (around 08/30/2022).   [name], NP-C [place]

## 2022-05-31 NOTE — Patient Instructions (Signed)
It was nice to meet you!  Please continue monitoring your blood pressure at home.  Continue your medications as prescribed. We discussed today stopping the Losartan and Amlodipine as directed by the hospital.  We will re-check some labs today and I will contact you with the results.  Please follow-up with cardiology as scheduled.

## 2022-06-01 ENCOUNTER — Telehealth: Payer: Self-pay

## 2022-06-01 NOTE — Telephone Encounter (Signed)
LMOM for pt to CB in regards to labs 

## 2022-06-13 DIAGNOSIS — M2141 Flat foot [pes planus] (acquired), right foot: Secondary | ICD-10-CM | POA: Diagnosis not present

## 2022-06-13 DIAGNOSIS — M79672 Pain in left foot: Secondary | ICD-10-CM | POA: Diagnosis not present

## 2022-06-13 DIAGNOSIS — M79671 Pain in right foot: Secondary | ICD-10-CM | POA: Diagnosis not present

## 2022-06-13 DIAGNOSIS — M2142 Flat foot [pes planus] (acquired), left foot: Secondary | ICD-10-CM | POA: Diagnosis not present

## 2022-06-13 DIAGNOSIS — M2041 Other hammer toe(s) (acquired), right foot: Secondary | ICD-10-CM | POA: Diagnosis not present

## 2022-06-13 DIAGNOSIS — M7741 Metatarsalgia, right foot: Secondary | ICD-10-CM | POA: Diagnosis not present

## 2022-06-14 NOTE — Progress Notes (Signed)
Remote pacemaker transmission.   

## 2022-06-19 ENCOUNTER — Other Ambulatory Visit: Payer: Self-pay

## 2022-06-19 DIAGNOSIS — B002 Herpesviral gingivostomatitis and pharyngotonsillitis: Secondary | ICD-10-CM

## 2022-06-19 DIAGNOSIS — F5104 Psychophysiologic insomnia: Secondary | ICD-10-CM

## 2022-06-19 MED ORDER — ACYCLOVIR 400 MG PO TABS: 400.0000 mg | ORAL_TABLET | Freq: Two times a day (BID) | ORAL | 1 refills | Status: DC

## 2022-06-19 NOTE — Telephone Encounter (Signed)
Controlled substance Rx refill request came via fax for alprazolam from [facility].

## 2022-06-21 ENCOUNTER — Telehealth: Payer: Self-pay

## 2022-06-21 NOTE — Patient Outreach (Signed)
  Care Coordination   Follow Up Visit Note   06/21/2022 Name: Kim Pennington MRN: [id number] DOB: 03-Oct-1955  Kim Pennington is a 67 y.o. year old female who sees [name], NP for primary care. I spoke with  Kim Pennington by phone today. Received call from Kim Pennington with primary care provider office stating patient has arrived to office for appointment with [facility].  Explained that appointment with  RNCM is a telephone appointment.  This RNCM called patient. Apologized for the confusion.    What matters to the patients health and wellness today?  "I would like to get more stable with my blood pressure so I can resume driving."     Goals Addressed             This Visit's Progress    Patient Stated:  Management of vasovagal syncope and low  blood pressures       Care Coordination Interventions: Evaluation of current treatment plan related to vasovagal syncope and low blood pressures and patient's adherence to plan as established by provider: Patient states she is still having some lightheadedness/ dizziness but not to the point of passing out.   She reports recent blood pressures have been: 117/78, 113/82, 119/76.  Patient states she continues to monitor her blood pressure daily. She states she is no longer taking any blood pressure medications.  Reviewed medications with patient and discussed importance of compliance Reviewed scheduled/upcoming provider appointments:  Clarified with patient she has an appointment scheduled with gastroenterologist for 07/05/22 and next follow up with her primary care provider is 08/29/22.   Discussed plans with patient for ongoing care management follow up and provided patient with direct contact information for care management team:  Patient verbally agreed to next telephone outreach with [facility] for 07/13/22.  Re-Discussed importance of getting in a supine position at onset of syncope symptoms.  Per provider recommendation, advised patient to wear compression  garments Patient sent information through Mychart on fall precautions.            SDOH assessments and interventions completed:  Yes  SDOH Interventions Today    Flowsheet Row Most Recent Value  SDOH Interventions   Food Insecurity Interventions Intervention Not Indicated  Housing Interventions Intervention Not Indicated  Transportation Interventions Intervention Not Indicated        Care Coordination Interventions:  No, not indicated   Follow up plan: Follow up call scheduled for 07/13/22    Encounter Outcome:  Pt. Visit Completed   Quinn Plowman RN,BSN,CCM [place] [phone] direct line

## 2022-06-26 ENCOUNTER — Telehealth: Payer: Self-pay

## 2022-06-26 NOTE — Telephone Encounter (Signed)
Patient saw [facility] on 08/26/2021 and now has appointment with [place] on 07/05/2021. We need to find out if patient is transferring her care to [place] and why. If she is transferring care please get Dr. Marius Ditch approval before they are seen

## 2022-06-28 NOTE — Telephone Encounter (Signed)
Call pt left message waiting on call back

## 2022-06-28 NOTE — Telephone Encounter (Signed)
Patient states that [facility] could not find anything wrong with her but before moving to [place] the patient was seen at a GI in [place] and they diagnosed her with colitis. Patient is wanting to transfer care because her PCP recommended her to come see [place].

## 2022-06-30 ENCOUNTER — Other Ambulatory Visit: Payer: Self-pay

## 2022-07-05 ENCOUNTER — Telehealth: Payer: Self-pay | Admitting: Cardiology

## 2022-07-05 ENCOUNTER — Ambulatory Visit: Payer: Medicare Other | Admitting: Gastroenterology

## 2022-07-05 ENCOUNTER — Encounter: Payer: Self-pay | Admitting: Gastroenterology

## 2022-07-05 VITALS — BP 126/92 | HR 80 | Temp 98.1°F | Ht 63.0 in | Wt 138.2 lb

## 2022-07-05 DIAGNOSIS — K580 Irritable bowel syndrome with diarrhea: Secondary | ICD-10-CM | POA: Diagnosis not present

## 2022-07-05 DIAGNOSIS — K529 Noninfective gastroenteritis and colitis, unspecified: Secondary | ICD-10-CM | POA: Diagnosis not present

## 2022-07-05 DIAGNOSIS — K080 Exfoliation of teeth due to systemic causes: Secondary | ICD-10-CM | POA: Diagnosis not present

## 2022-07-05 MED ORDER — DICYCLOMINE HCL 10 MG PO CAPS: 10.0000 mg | ORAL_CAPSULE | Freq: Three times a day (TID) | ORAL | 0 refills | Status: DC

## 2022-07-05 NOTE — Telephone Encounter (Signed)
Spoke with the patient who was hospitalized after multiple syncopal episodes. She was discharged with instructions of no driving for 6 months and she would like to know if she can drive now since she has not had anymore episodes.  Patient was never set up for a follow up with cardiology after her hospitalization. Advised that she would need an appointment before she can be cleared to resume driving. Patient verbalized understanding. Appointment has been made.

## 2022-07-05 NOTE — Progress Notes (Unsigned)
Cephas Darby, MD [address]  [place]  [place], [place] [address]  Main: [phone]  Fax: [phone]    Gastroenterology Consultation  Referring Provider:     Waunita Schooner, MD Primary Care Physician:  [name], NP Primary Gastroenterologist:  Dr. Cephas Darby Reason for Consultation:     ***        HPI:   Kim Pennington is a 67 y.o. female referred by Dr. [name], NP  for consultation & management of ***  NSAIDs: ***  Antiplts/Anticoagulants/Anti thrombotics: ***  GI Procedures:  Surgical [P], colon nos, random sites - BENIGN COLONIC MUCOSA WITH DENUDED EPITHELIUM AND EDEMATOUS CHANGES - NO ACTIVE INFLAMMATION OR EVIDENCE OF MICROSCOPIC COLITIS - NO HIGH-GRADE DYSPLASIA OR MALIGNANCY IDENTIFIED  Past Medical History:  Diagnosis Date   Acquired solitary kidney    right nephrectomy   Anemia    Anxiety    Arthritis    C. difficile diarrhea    Depression    Hepatitis B    Hyperlipidemia    Hypertension    Kidney disease, chronic, stage III (GFR 30-59 ml/min) (HCC)    Kidney stones    Lymphocytic colitis    Memory loss    Osteoporosis    Pacemaker    Renal failure, chronic, stage 3 (moderate) ([place]) 2015   Left kidney    Skin cancer     Past Surgical History:  Procedure Laterality Date   ABDOMINAL HYSTERECTOMY  11/2006   ABDOMINOPLASTY     BREAST LUMPECTOMY Right 1979   CARDIAC CATHETERIZATION  04/24/2014   CARPAL TUNNEL RELEASE Bilateral 03/29/2009   CESAREAN SECTION     x4   COLONOSCOPY     FOOT SURGERY Bilateral    HIP ARTHROSCOPY Right 09/14/2016   reduction of lesser trochanter    HIP ARTHROSCOPY Left 04/25/2016   w/ femoroplasty   left thumb tendon  04/25/2016   LIPOSUCTION     LUMBAR LAMINECTOMY  11/23/2019   foraminotomy, discectomy, facectomy   NEPHRECTOMY Right 2009   PACEMAKER IMPLANT  04/24/2014   RHINOPLASTY     x 2, 1999, 2003   ROTATOR CUFF REPAIR Right    SHOULDER ARTHROSCOPY Right 12/30/2018   x2    TONSILLECTOMY  4818   UMBILICAL HERNIA REPAIR  1998   UPPER GASTROINTESTINAL ENDOSCOPY     WEIL OSTEOTOMY Bilateral 11/27/2019    Prior to Admission medications   Medication Sig Start Date End Date Taking? Authorizing Provider  acyclovir (ZOVIRAX) 400 MG tablet Take 1 tablet (400 mg total) by mouth 2 (two) times daily. 06/19/22  Yes [name], NP  alendronate (FOSAMAX) 70 MG tablet TAKE 1 TABLET(70 MG) BY MOUTH 1 TIME A WEEK 01/05/22  Yes Waunita Schooner, MD  ALPRAZolam (XANAX) 1 MG tablet TAKE 1 TABLET(1 MG) BY MOUTH AT BEDTIME AS NEEDED Patient taking differently: Take 1 mg by mouth daily as needed for anxiety. 03/10/22  Yes Waunita Schooner, MD  atorvastatin (LIPITOR) 80 MG tablet TAKE 1 TABLET(80 MG) BY MOUTH DAILY Patient taking differently: Take 80 mg by mouth daily. 11/22/21  Yes Waunita Schooner, MD  Calcium Carbonate-Vit D-Min (CALCIUM 1200 PO) Take 1 tablet by mouth daily.   Yes [provider]  CRANBERRY-CALCIUM PO Take 1 tablet by mouth daily.   Yes [provider]  estradiol (ESTRACE) 0.5 MG tablet TAKE 1 TABLET BY MOUTH DAILY 12/19/21  Yes Waunita Schooner, MD  montelukast (SINGULAIR) 10 MG tablet TAKE 1 TABLET BY MOUTH  DAILY AS NEEDED Patient taking differently: Take 10 mg by mouth daily as needed (breathing). 03/10/22  Yes Waunita Schooner, MD  ondansetron (ZOFRAN) 4 MG tablet Take 1 tablet (4 mg total) by mouth every 8 (eight) hours as needed for nausea or vomiting. 01/20/22  Yes Bedsole, Amy E, MD  OVER THE COUNTER MEDICATION Take 3 tablets by mouth daily. Plesus, 1 tab am and 2 in pm   Yes [provider]  prazosin (MINIPRESS) 1 MG capsule Take 1 capsule (1 mg total) by mouth at bedtime. 05/19/22  Yes Hongalgi, Lenis Dickinson, MD  vitamin B-12 (CYANOCOBALAMIN) 1000 MCG tablet Take 1 tablet (1,000 mcg total) by mouth daily. 06/29/20  Yes Waunita Schooner, MD    Family History  Problem Relation Age of Onset   Diabetes Mother    Parkinson's disease Mother    Alzheimer's  disease Mother    Heart disease Father    Alzheimer's disease Father    Hypertension Father    CVA Father    Rheum arthritis Father    Stomach cancer Sister    Hyperlipidemia Sister    Hypertension Sister    Diabetes Sister    Liver disease Sister    Drug abuse Sister    Obesity Sister    Hyperlipidemia Sister    Hypertension Sister    Diabetes Sister    Heart disease Sister    Obesity Sister    Diabetes Sister    Breast cancer Sister 34       x2   Hyperlipidemia Sister    Hypertension Sister    Colon polyps Sister    Obesity Sister    Hyperlipidemia Sister    Hypertension Sister    Obesity Sister    Hyperlipidemia Brother    Hypertension Brother    Heart disease Brother    Obesity Brother    Diabetes Maternal Aunt    Diabetes Maternal Grandmother    Heart attack Paternal Grandfather    Osteoarthritis Daughter    Obesity Son    Colon cancer Neg Hx    Esophageal cancer Neg Hx      Social History   Tobacco Use   Smoking status: Never   Smokeless tobacco: Never  Vaping Use   Vaping Use: Never used  Substance Use Topics   Alcohol use: Never   Drug use: Never    Allergies as of 07/05/2022 - Review Complete 07/05/2022  Allergen Reaction Noted   Tape Hives 04/10/2020    Review of Systems:    All systems reviewed and negative except where noted in HPI.   Physical Exam:  BP (!) 145/87 (BP Location: Right Arm, Patient Position: Sitting, Cuff Size: Normal)   Pulse 70   Temp 98.1 F (36.7 C) (Oral)   Ht '5\' 3"'$  (1.6 m)   Wt 138 lb 4 oz (62.7 kg)   BMI 24.49 kg/m  No LMP recorded. Patient has had a hysterectomy.  General:   Alert,  Well-developed, well-nourished, pleasant and cooperative in NAD Head:  Normocephalic and atraumatic. Eyes:  Sclera clear, no icterus.   Conjunctiva pink. Ears:  Normal auditory acuity. Nose:  No deformity, discharge, or lesions. Mouth:  No deformity or lesions,oropharynx pink & moist. Neck:  Supple; no masses or  thyromegaly. Lungs:  Respirations even and unlabored.  Clear throughout to auscultation.   No wheezes, crackles, or rhonchi. No acute distress. Heart:  Regular rate and rhythm; no murmurs, clicks, rubs, or gallops. Abdomen:  Normal bowel sounds. Soft, non-tender  and non-distended without masses, hepatosplenomegaly or hernias noted.  No guarding or rebound tenderness.   Rectal: Not performed Msk:  Symmetrical without gross deformities. Good, equal movement & strength bilaterally. Pulses:  Normal pulses noted. Extremities:  No clubbing or edema.  No cyanosis. Neurologic:  Alert and oriented x3;  grossly normal neurologically. Skin:  Intact without significant lesions or rashes. No jaundice. Lymph Nodes:  No significant cervical adenopathy. Psych:  Alert and cooperative. Normal mood and affect.  Imaging Studies: ***  Assessment and Plan:   Kim Pennington is a 67 y.o. female with ***   Follow up in ***   Cephas Darby, MD

## 2022-07-05 NOTE — Telephone Encounter (Signed)
Patient called to find out if her "no-driving" per Dr. [name] can be lifted as she has been working out and has not had any other episodes.

## 2022-07-06 ENCOUNTER — Encounter: Payer: Self-pay | Admitting: Gastroenterology

## 2022-07-07 LAB — CELIAC DISEASE PANEL
Endomysial IgA: NEGATIVE
IgA/Immunoglobulin A, Serum: 89 mg/dL (ref 87–352)
Transglutaminase IgA: <2 U/mL (ref 0–3)

## 2022-07-07 LAB — ALPHA-GAL PANEL
Allergen Lamb IgE: <0.1 kU/L
Beef IgE: <0.1 kU/L
IgE (Immunoglobulin E), Serum: 12 IU/mL (ref 6–495)
O215-IgE Alpha-Gal: <0.1 kU/L
Pork IgE: <0.1 kU/L

## 2022-07-07 LAB — FOOD ALLERGY PROFILE
Allergen Corn, IgE: <0.1 kU/L
Clam IgE: <0.1 kU/L
Codfish IgE: <0.1 kU/L
Egg White IgE: <0.1 kU/L
Milk IgE: <0.1 kU/L
Peanut IgE: 0.17 kU/L — AB
Scallop IgE: <0.1 kU/L
Sesame Seed IgE: <0.1 kU/L
Shrimp IgE: <0.1 kU/L
Soybean IgE: 0.13 kU/L — AB
Walnut IgE: <0.1 kU/L
Wheat IgE: <0.1 kU/L

## 2022-07-10 ENCOUNTER — Encounter: Payer: Self-pay | Admitting: Gastroenterology

## 2022-07-13 ENCOUNTER — Ambulatory Visit: Payer: Self-pay

## 2022-07-13 NOTE — Patient Outreach (Signed)
  Care Coordination   Follow Up Visit Note   07/13/2022 Name: Kim Pennington MRN: [id number] DOB: 1956/03/24  Kim Pennington is a 67 y.o. year old female who sees [name], NP for primary care. I spoke with  Maren Reamer by phone today.  What matters to the patients health and wellness today?  Patient reports symptoms have resolved.  Patient agrees care coordination goals are met. Patient denies any further needs or concerns for follow up.     Goals Addressed             This Visit's Progress    COMPLETED: Patient Stated:  Management of vasovagal syncope and low  blood pressures       Interventions Today    Flowsheet Row Most Recent Value  Chronic Disease Discussed/Reviewed   Chronic disease discussed/reviewed during today's visit Other  [syncope]  General Interventions   General Interventions Discussed/Reviewed General Interventions Reviewed  [evaluation of current status and evaluation of treatment plan]     Care Coordination Interventions: Evaluation of current treatment plan related to vasovagal syncope and low blood pressures and patient's adherence to plan as established by provider: Patient  reports symptoms have much improved. She states reports blood pressure of 118/90, 120/70-80's.  She states lightheadedness/ dizziness has resolved.  Patient states her doctor discontinued her losartan and amlodipine.  Reviewed medications with patient and discussed importance of compliance Reviewed scheduled/upcoming provider appointments:  Next primary care provider appointment  is 08/29/22.             SDOH assessments and interventions completed:  No     Care Coordination Interventions:  Yes, provided   Follow up plan: No further intervention required.   Encounter Outcome:  Pt. Visit Completed   Quinn Plowman RN,BSN,CCM [place] [phone] direct line

## 2022-07-17 ENCOUNTER — Telehealth: Payer: Self-pay

## 2022-07-17 NOTE — Telephone Encounter (Signed)
Called pharmacy and they said yes insurance did not cover the medication but they had a coupon and it was 5 dollars. Informed patient and patient verbalized understanding

## 2022-07-17 NOTE — Telephone Encounter (Signed)
Patient left a voicemail stating the pharmacy is having trouble filling the Dicyclomine.

## 2022-07-17 NOTE — Telephone Encounter (Signed)
Front desk does medical records will send to front staff

## 2022-07-27 DIAGNOSIS — Z905 Acquired absence of kidney: Secondary | ICD-10-CM | POA: Diagnosis not present

## 2022-07-27 DIAGNOSIS — N130 Hydronephrosis with ureteropelvic junction obstruction: Secondary | ICD-10-CM | POA: Diagnosis not present

## 2022-08-02 ENCOUNTER — Ambulatory Visit: Payer: Medicare Other | Attending: Cardiology | Admitting: Cardiology

## 2022-08-02 ENCOUNTER — Encounter: Payer: Self-pay | Admitting: Cardiology

## 2022-08-02 VITALS — BP 112/70 | HR 71 | Ht 63.0 in | Wt 136.0 lb

## 2022-08-02 DIAGNOSIS — R55 Syncope and collapse: Secondary | ICD-10-CM

## 2022-08-02 DIAGNOSIS — Z950 Presence of cardiac pacemaker: Secondary | ICD-10-CM | POA: Diagnosis not present

## 2022-08-02 DIAGNOSIS — I495 Sick sinus syndrome: Secondary | ICD-10-CM | POA: Diagnosis not present

## 2022-08-02 LAB — PACEMAKER DEVICE OBSERVATION

## 2022-08-02 NOTE — Progress Notes (Signed)
  Electrophysiology Office Follow up Visit Note:    Date:  08/02/2022   ID:  Kim Pennington, DOB 05-23-1956, MRN [id number]  PCP:  Patient, No Pcp Per  [facility] Cardiologist:  None  [facility] Electrophysiologist:  Vickie Epley, MD    Interval History:    Kim Pennington is a 67 y.o. female who presents for a follow up visit.   Last seen August 24, 2021.  He has a permanent pacemaker in situ.  At the last appointment everything was functioning appropriately and 1 year follow-up was planned.  She was hospitalized in December with syncope.  She had a prodrome of diaphoresis and "odd sensation" prior to the syncope.  Her pacemaker showed stable heart rhythm during that time.  I saw her when she was in the hospital on December 15 and thought that her syncope was likely due to hyper vagotonia.   She has been doing okay since leaving the hospital.  No recurrent episodes of presyncope or syncope.  She has been working on staying very active.  She exercises daily.  She continues to struggle with her husband's health.  [name] was recently ruled out with a biopsy.  He is seeing neurology for further workup.      Past medical, surgical, social and family history were reviewed.  ROS:   Please see the history of present illness.    All other systems reviewed and are negative.  EKGs/Labs/Other Studies Reviewed:    The following studies were reviewed today:  August 02, 2022 in clinic device interrogation personally reviewed Battery longevity 3.8 years Lead parameter stable Atrial pacing 16% Ventricular pacing less than 1% Shortened PVR today, turned off rate response PVR     Physical Exam:    VS:  BP 112/70   Pulse 71   Ht 5' 3"$  (1.6 m)   Wt 136 lb (61.7 kg)   SpO2 97%   BMI 24.09 kg/m     Wt Readings from Last 3 Encounters:  08/02/22 136 lb (61.7 kg)  07/05/22 138 lb 4 oz (62.7 kg)  05/31/22 134 lb (60.8 kg)     GEN:  Well nourished, well  developed in no acute distress CARDIAC: RRR, no murmurs, rubs, gallops.  Pacemaker pocket well-healed RESPIRATORY:  Clear to auscultation without rales, wheezing or rhonchi       ASSESSMENT:    No diagnosis found. PLAN:    In order of problems listed above:  #Syncope Not arrhythmic.  Secondary to vasovagal.  Has not been recurrent since leaving the hospital.  At this point, given the lack of recurrence and clear vasovagal nature of the arrhythmia with a prolonged prodrome phase, I think it is reasonable for her to operate a vehicle at this time.  This was discussed in detail with the patient.  If she experiences any prodromal symptoms, she should immediately get to the ground to avoid personal injury. Stay hydrated.  Use compression stockings.  Stay physically active.  #Sinoatrial node dysfunction #Permanent pacemaker in situ Device functioning appropriately.  Continue remote monitoring.    Follow-up 1 year with APP.   Signed, Lars Mage, MD, [facility], [facility] 08/02/2022 3:10 PM    Electrophysiology [facility]

## 2022-08-02 NOTE — Patient Instructions (Signed)
Medication Instructions:  Your physician recommends that you continue on your current medications as directed. Please refer to the Current Medication list given to you today.  *If you need a refill on your cardiac medications before your next appointment, please call your pharmacy*  Follow-Up: At [facility], you and your health needs are our priority.  As part of our continuing mission to provide you with exceptional heart care, we have created designated Provider Care Teams.  These Care Teams include your primary Cardiologist (physician) and Advanced Practice Providers (APPs -  Physician Assistants and Nurse Practitioners) who all work together to provide you with the care you need, when you need it.  Your next appointment:   1 year(s)  Provider:   You may see Vickie Epley, MD or one of the following Advanced Practice Providers on your designated Care Team:   Tommye Standard, [place] Beryle Beams" [place], [place], NP

## 2022-08-16 ENCOUNTER — Ambulatory Visit (INDEPENDENT_AMBULATORY_CARE_PROVIDER_SITE_OTHER): Payer: Medicare Other

## 2022-08-16 DIAGNOSIS — I495 Sick sinus syndrome: Secondary | ICD-10-CM | POA: Diagnosis not present

## 2022-08-17 LAB — CUP PACEART REMOTE DEVICE CHECK
Battery Remaining Longevity: 40 mo
Battery Remaining Percentage: 33 %
Battery Voltage: 2.95 V
Brady Statistic AP VP Percent: 1 %
Brady Statistic AP VS Percent: 31 %
Brady Statistic AS VP Percent: 1 %
Brady Statistic AS VS Percent: 69 %
Brady Statistic RA Percent Paced: 31 %
Brady Statistic RV Percent Paced: 1 %
Date Time Interrogation Session: 20240313020015
Implantable Lead Connection Status: 753985
Implantable Lead Connection Status: 753985
Implantable Lead Implant Date: 20151120
Implantable Lead Implant Date: 20151120
Implantable Lead Location: 753862
Implantable Lead Location: 753862
Implantable Pulse Generator Implant Date: 20151120
Lead Channel Impedance Value: 400 Ohm
Lead Channel Impedance Value: 630 Ohm
Lead Channel Pacing Threshold Amplitude: 0.75 V
Lead Channel Pacing Threshold Amplitude: 0.75 V
Lead Channel Pacing Threshold Pulse Width: 0.5 ms
Lead Channel Pacing Threshold Pulse Width: 0.5 ms
Lead Channel Sensing Intrinsic Amplitude: 1.7 mV
Lead Channel Sensing Intrinsic Amplitude: 6 mV
Lead Channel Setting Pacing Amplitude: 2 V
Lead Channel Setting Pacing Amplitude: 2 V
Lead Channel Setting Pacing Pulse Width: 0.5 ms
Lead Channel Setting Sensing Sensitivity: 0.5 mV
Pulse Gen Model: 2240
Pulse Gen Serial Number: 7689719

## 2022-08-28 ENCOUNTER — Other Ambulatory Visit: Payer: Self-pay | Admitting: General Practice

## 2022-08-28 DIAGNOSIS — F4310 Post-traumatic stress disorder, unspecified: Secondary | ICD-10-CM

## 2022-08-28 DIAGNOSIS — F5104 Psychophysiologic insomnia: Secondary | ICD-10-CM

## 2022-08-28 NOTE — Telephone Encounter (Signed)
Pt called in stated she her medication refill until her [facility] appointment on 09/13/22 . RX atorvastatin 80 mg ,prazosin (MINIPRESS) 1 MG capsule , ALPRAZolam (XANAX) 1 MG tablet  . Please advise [phone]

## 2022-08-29 ENCOUNTER — Ambulatory Visit: Payer: Medicare Other | Admitting: Nurse Practitioner

## 2022-08-29 MED ORDER — ALPRAZOLAM 1 MG PO TABS: 1.0000 mg | ORAL_TABLET | Freq: Every evening | ORAL | 0 refills | Status: DC | PRN

## 2022-08-29 MED ORDER — PRAZOSIN HCL 1 MG PO CAPS: 1.0000 mg | ORAL_CAPSULE | Freq: Every day | ORAL | 3 refills | Status: DC

## 2022-09-05 ENCOUNTER — Ambulatory Visit: Payer: Medicare Other | Admitting: Nurse Practitioner

## 2022-09-13 ENCOUNTER — Ambulatory Visit: Payer: Medicare Other | Admitting: Family Medicine

## 2022-09-13 ENCOUNTER — Telehealth: Payer: Self-pay | Admitting: Nurse Practitioner

## 2022-09-13 NOTE — Telephone Encounter (Signed)
Prescription Request  09/13/2022  LOV: 05/31/2022  What is the name of the medication or equipment? Atorvastatin  80mg  and Montelukast 10 mg  Have you contacted your pharmacy to request a refill? No   Which pharmacy would you like this sent to?  [id number] - [place], [place] - [address] AT [facility] [address] [facility] [address] [place] [address] Phone: [phone] Fax: [phone]    Patient notified that their request is being sent to the clinical staff for review and that they should receive a response within 2 business days.   Please advise at Mobile [phone] (mobile)

## 2022-09-15 NOTE — Telephone Encounter (Signed)
Mychart msg sent informing

## 2022-09-20 DIAGNOSIS — D2261 Melanocytic nevi of right upper limb, including shoulder: Secondary | ICD-10-CM | POA: Diagnosis not present

## 2022-09-20 DIAGNOSIS — L280 Lichen simplex chronicus: Secondary | ICD-10-CM | POA: Diagnosis not present

## 2022-09-20 DIAGNOSIS — D2272 Melanocytic nevi of left lower limb, including hip: Secondary | ICD-10-CM | POA: Diagnosis not present

## 2022-09-20 DIAGNOSIS — D485 Neoplasm of uncertain behavior of skin: Secondary | ICD-10-CM | POA: Diagnosis not present

## 2022-09-20 DIAGNOSIS — L538 Other specified erythematous conditions: Secondary | ICD-10-CM | POA: Diagnosis not present

## 2022-09-20 DIAGNOSIS — L820 Inflamed seborrheic keratosis: Secondary | ICD-10-CM | POA: Diagnosis not present

## 2022-09-20 DIAGNOSIS — Z85828 Personal history of other malignant neoplasm of skin: Secondary | ICD-10-CM | POA: Diagnosis not present

## 2022-09-20 DIAGNOSIS — D225 Melanocytic nevi of trunk: Secondary | ICD-10-CM | POA: Diagnosis not present

## 2022-09-20 DIAGNOSIS — R208 Other disturbances of skin sensation: Secondary | ICD-10-CM | POA: Diagnosis not present

## 2022-09-20 NOTE — Progress Notes (Signed)
Remote pacemaker transmission.   

## 2022-09-21 ENCOUNTER — Ambulatory Visit (INDEPENDENT_AMBULATORY_CARE_PROVIDER_SITE_OTHER): Payer: Medicare Other | Admitting: Family Medicine

## 2022-09-21 VITALS — BP 132/90 | HR 84 | Temp 98.4°F | Ht 63.0 in | Wt 126.6 lb

## 2022-09-21 DIAGNOSIS — N1831 Chronic kidney disease, stage 3a: Secondary | ICD-10-CM

## 2022-09-21 DIAGNOSIS — Z1159 Encounter for screening for other viral diseases: Secondary | ICD-10-CM

## 2022-09-21 DIAGNOSIS — I5032 Chronic diastolic (congestive) heart failure: Secondary | ICD-10-CM | POA: Diagnosis not present

## 2022-09-21 DIAGNOSIS — Z780 Asymptomatic menopausal state: Secondary | ICD-10-CM

## 2022-09-21 DIAGNOSIS — B002 Herpesviral gingivostomatitis and pharyngotonsillitis: Secondary | ICD-10-CM

## 2022-09-21 DIAGNOSIS — F5104 Psychophysiologic insomnia: Secondary | ICD-10-CM

## 2022-09-21 DIAGNOSIS — I10 Essential (primary) hypertension: Secondary | ICD-10-CM

## 2022-09-21 DIAGNOSIS — R7989 Other specified abnormal findings of blood chemistry: Secondary | ICD-10-CM

## 2022-09-21 DIAGNOSIS — Z1231 Encounter for screening mammogram for malignant neoplasm of breast: Secondary | ICD-10-CM

## 2022-09-21 DIAGNOSIS — Z950 Presence of cardiac pacemaker: Secondary | ICD-10-CM

## 2022-09-21 DIAGNOSIS — I495 Sick sinus syndrome: Secondary | ICD-10-CM

## 2022-09-21 MED ORDER — ALPRAZOLAM 0.5 MG PO TABS
0.5000 mg | ORAL_TABLET | Freq: Every evening | ORAL | 0 refills | Status: DC | PRN
Start: 2022-09-21 — End: 2023-01-08

## 2022-09-21 MED ORDER — VITAMIN B-12 1000 MCG SL SUBL
1000.0000 ug | SUBLINGUAL_TABLET | Freq: Every day | SUBLINGUAL | 3 refills | Status: DC
Start: 2022-09-21 — End: 2022-11-09

## 2022-09-21 MED ORDER — VALACYCLOVIR HCL 1 G PO TABS
1000.0000 mg | ORAL_TABLET | Freq: Two times a day (BID) | ORAL | 1 refills | Status: DC
Start: 2022-09-21 — End: 2023-02-06
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
## 2022-09-21 NOTE — Patient Instructions (Addendum)
It was a pleasure meeting you today. Thank you for allowing me to take part in your health care.  Our goals for today as we discussed include:  Please have shingles and tetanus records sent to clinic to update.  Referral sent for Mammogram and Dexa. Due  Please call to schedule appointment.0/2024. [facility] [address] [place], [place] [address] [phone]   Schedule fasting lab appointment. Fast for 10 hours before blood taken,  If you have any questions or concerns, please do not hesitate to call the office at [phone].  I look forward to our next visit and until then take care and stay safe.  Regards,   Dana Allan, MD   [facility]

## 2022-09-25 DIAGNOSIS — Z950 Presence of cardiac pacemaker: Secondary | ICD-10-CM | POA: Diagnosis not present

## 2022-09-25 DIAGNOSIS — M47817 Spondylosis without myelopathy or radiculopathy, lumbosacral region: Secondary | ICD-10-CM | POA: Diagnosis not present

## 2022-09-25 DIAGNOSIS — M47816 Spondylosis without myelopathy or radiculopathy, lumbar region: Secondary | ICD-10-CM | POA: Diagnosis not present

## 2022-09-25 DIAGNOSIS — Z45018 Encounter for adjustment and management of other part of cardiac pacemaker: Secondary | ICD-10-CM | POA: Diagnosis not present

## 2022-09-25 DIAGNOSIS — M48061 Spinal stenosis, lumbar region without neurogenic claudication: Secondary | ICD-10-CM | POA: Diagnosis not present

## 2022-09-25 DIAGNOSIS — M4807 Spinal stenosis, lumbosacral region: Secondary | ICD-10-CM | POA: Diagnosis not present

## 2022-09-27 ENCOUNTER — Other Ambulatory Visit: Payer: Self-pay | Admitting: Family Medicine

## 2022-09-27 ENCOUNTER — Other Ambulatory Visit (INDEPENDENT_AMBULATORY_CARE_PROVIDER_SITE_OTHER): Payer: Medicare Other

## 2022-09-27 ENCOUNTER — Telehealth: Payer: Self-pay | Admitting: *Deleted

## 2022-09-27 DIAGNOSIS — I10 Essential (primary) hypertension: Secondary | ICD-10-CM | POA: Diagnosis not present

## 2022-09-27 DIAGNOSIS — Z1159 Encounter for screening for other viral diseases: Secondary | ICD-10-CM | POA: Diagnosis not present

## 2022-09-27 LAB — CBC WITH DIFFERENTIAL/PLATELET
Basophils Absolute: 0 10*3/uL (ref 0.0–0.1)
Basophils Relative: 1 % (ref 0.0–3.0)
Eosinophils Absolute: 0.1 10*3/uL (ref 0.0–0.7)
Eosinophils Relative: 3.6 % (ref 0.0–5.0)
HCT: 43.2 % (ref 36.0–46.0)
Hemoglobin: 14.6 g/dL (ref 12.0–15.0)
Lymphocytes Relative: 43.7 % (ref 12.0–46.0)
Lymphs Abs: 1.5 10*3/uL (ref 0.7–4.0)
MCHC: 33.8 g/dL (ref 30.0–36.0)
MCV: 93.5 fl (ref 78.0–100.0)
Monocytes Absolute: 0.4 10*3/uL (ref 0.1–1.0)
Monocytes Relative: 12.7 % — ABNORMAL HIGH (ref 3.0–12.0)
Neutro Abs: 1.4 10*3/uL (ref 1.4–7.7)
Neutrophils Relative %: 39 % — ABNORMAL LOW (ref 43.0–77.0)
Platelets: 221 10*3/uL (ref 150.0–400.0)
RBC: 4.62 Mil/uL (ref 3.87–5.11)
RDW: 13.7 % (ref 11.5–15.5)
WBC: 3.5 10*3/uL — ABNORMAL LOW (ref 4.0–10.5)

## 2022-09-27 LAB — COMPREHENSIVE METABOLIC PANEL
ALT: 37 U/L — ABNORMAL HIGH (ref 0–35)
AST: 29 U/L (ref 0–37)
Albumin: 4.5 g/dL (ref 3.5–5.2)
Alkaline Phosphatase: 38 U/L — ABNORMAL LOW (ref 39–117)
BUN: 31 mg/dL — ABNORMAL HIGH (ref 6–23)
CO2: 28 mEq/L (ref 19–32)
Calcium: 9.7 mg/dL (ref 8.4–10.5)
Chloride: 104 mEq/L (ref 96–112)
Creatinine, Ser: 1.01 mg/dL (ref 0.40–1.20)
GFR: 58.3 mL/min — ABNORMAL LOW (ref >60.00)
Glucose, Bld: 90 mg/dL (ref 70–99)
Potassium: 4.7 mEq/L (ref 3.5–5.1)
Sodium: 140 mEq/L (ref 135–145)
Total Bilirubin: 0.5 mg/dL (ref 0.2–1.2)
Total Protein: 6.9 g/dL (ref 6.0–8.3)

## 2022-09-27 LAB — VITAMIN D 25 HYDROXY (VIT D DEFICIENCY, FRACTURES): VITD: 109.21 ng/mL (ref 30.00–100.00)

## 2022-09-27 LAB — LIPID PANEL
Cholesterol: 181 mg/dL (ref 0–200)
HDL: 79.2 mg/dL (ref >39.00)
LDL Cholesterol: 89 mg/dL (ref 0–99)
NonHDL: 101.43
Total CHOL/HDL Ratio: 2
Triglycerides: 63 mg/dL (ref 0.0–149.0)
VLDL: 12.6 mg/dL (ref 0.0–40.0)

## 2022-09-27 LAB — TSH: TSH: 0.9 u[IU]/mL (ref 0.35–5.50)

## 2022-09-27 LAB — VITAMIN B12: Vitamin B-12: >1500 pg/mL — ABNORMAL HIGH (ref 211–911)

## 2022-09-27 LAB — HEMOGLOBIN A1C: Hgb A1c MFr Bld: 5.9 % (ref 4.6–6.5)

## 2022-09-27 NOTE — Telephone Encounter (Signed)
CRITICAL VALUE STICKER  CRITICAL VALUE: Vitamin D- 109.1 (H)  RECEIVER (on-site recipient of call): Silvestre Moment, CMA  DATE & TIME NOTIFIED: 09/27/22 @ 3:55pm  MESSENGER (representative from lab): Henderson Cloud  MD NOTIFIED: Dr. Clent Ridges  TIME OF NOTIFICATION: 4:04pm  RESPONSE:

## 2022-09-28 LAB — HEPATITIS C ANTIBODY: Hepatitis C Ab: NONREACTIVE

## 2022-09-28 NOTE — Telephone Encounter (Signed)
Noted  

## 2022-09-30 ENCOUNTER — Encounter: Payer: Self-pay | Admitting: Family Medicine

## 2022-09-30 DIAGNOSIS — Z1159 Encounter for screening for other viral diseases: Secondary | ICD-10-CM | POA: Insufficient documentation

## 2022-09-30 DIAGNOSIS — Z780 Asymptomatic menopausal state: Secondary | ICD-10-CM | POA: Insufficient documentation

## 2022-09-30 DIAGNOSIS — R7989 Other specified abnormal findings of blood chemistry: Secondary | ICD-10-CM | POA: Insufficient documentation

## 2022-09-30 DIAGNOSIS — Z1231 Encounter for screening mammogram for malignant neoplasm of breast: Secondary | ICD-10-CM | POA: Insufficient documentation

## 2022-09-30 NOTE — Assessment & Plan Note (Signed)
Chronic.  Asymptomatic.  Has pacemaker Follows with cardiology

## 2022-09-30 NOTE — Assessment & Plan Note (Addendum)
Chronic.  Previously on Norvasc and Coreg was discontinued secondary to vasovagal syncope. CKD stage IIIa On Minipress 1 mg for night terrors. Blood pressure goal per JNC 8 guidelines less than 140/90. Could consider increasing Minipress sure continues to elevate.

## 2022-09-30 NOTE — Assessment & Plan Note (Signed)
Chronic.  Outbreaks every 3 months. Discontinue Zovirax Trial of Valtrex 1 g twice daily x 24 hours to start at onset of symptoms.

## 2022-09-30 NOTE — Assessment & Plan Note (Signed)
Chronic.  Status post SSS Follows with Dr. Lalla Brothers

## 2022-09-30 NOTE — Assessment & Plan Note (Signed)
Chronic.  Grade 2 diastolic dysfunction noted on echo from 2022.  Most recent echo improved.  Asymptomatic today and euvolemic on exam. Follows with cardiology

## 2022-09-30 NOTE — Assessment & Plan Note (Signed)
Chronic.  Uses Xanax 0.5 mg and Minipress to help with sleep Refill Xanax 0.5 mg nightly Continue Minipress 1 minute nightly neck Discussed side effects of long-term use of benzodiazepines including falls, confusion, and association with cognitive decline. Encourage CBT

## 2022-09-30 NOTE — Assessment & Plan Note (Addendum)
Chronic.  Stable. Follows with [facility]. Vitamin D elevated.  Recommend discontinuing vitamin D. Check bmet

## 2022-10-03 LAB — TOXASSURE SELECT 13 (MW), URINE

## 2022-11-02 DIAGNOSIS — M5440 Lumbago with sciatica, unspecified side: Secondary | ICD-10-CM | POA: Diagnosis not present

## 2022-11-07 DIAGNOSIS — M7672 Peroneal tendinitis, left leg: Secondary | ICD-10-CM | POA: Diagnosis not present

## 2022-11-09 ENCOUNTER — Encounter: Payer: Self-pay | Admitting: Family Medicine

## 2022-11-09 ENCOUNTER — Ambulatory Visit (INDEPENDENT_AMBULATORY_CARE_PROVIDER_SITE_OTHER): Payer: Medicare Other | Admitting: Family Medicine

## 2022-11-09 VITALS — BP 122/78 | HR 74 | Ht 62.0 in | Wt 127.0 lb

## 2022-11-09 DIAGNOSIS — M65311 Trigger thumb, right thumb: Secondary | ICD-10-CM | POA: Diagnosis not present

## 2022-11-09 DIAGNOSIS — M25541 Pain in joints of right hand: Secondary | ICD-10-CM | POA: Diagnosis not present

## 2022-11-09 MED ORDER — PREDNISONE 20 MG PO TABS
20.0000 mg | ORAL_TABLET | Freq: Every day | ORAL | 0 refills | Status: AC
Start: 2022-11-09 — End: 2022-11-14

## 2022-11-09 NOTE — Progress Notes (Signed)
   SUBJECTIVE:   Chief Complaint  Patient presents with   Arthritis    Right hand joints--getting bigger, painful and need requesting referral   HPI Patient presents to Clinic for acute visit.  Reports 2 months of pain in joints of right hand.  Has noticed increased redness, swelling and difficulty flexing fingers.  Has also noticed right thumb catches intermittently and locks.  Denies any fevers, rash, trauma or repetitive motion.  No decrease in range of motion or decrease in sensation.  Pain elicited on touch otherwise doesn't bother her. No other joint pain today.  She is concerned as she has had a history of CMC surgery in the past and doesn't want to go through this again.  She does follow with orthopedics.   PERTINENT PMH / PSH: History of arthritis History of CMC arthroplasty Family history of rheumatoid arthritis  OBJECTIVE:  BP 122/78   Pulse 74   Ht 5\' 2"  (1.575 m)   Wt 127 lb (57.6 kg)   SpO2 96%   BMI 23.23 kg/m    Physical Exam Musculoskeletal:     Right hand: Swelling and tenderness present. Normal range of motion. Normal strength. Normal sensation. Normal pulse.     Left hand: Swelling and tenderness present. Normal range of motion. Normal strength. Normal sensation. Normal pulse.     Comments: Right first through third MCP joint erythema Right trigger thumb     ASSESSMENT/PLAN:  Arthralgia of right hand Assessment & Plan: Symptoms x 2 months and not improving.  No indication for xray given no recent trauma.   Check CBC, CRP, ESR, ANA, Uric Acid Start Prednisone 20 mg daily If labs positive for rheumatological markers will refer to Rheumatology for evaluation given family history of RA  Orders: -     CBC with Differential/Platelet -     Uric acid -     Sedimentation rate -     C-reactive protein -     Rheumatoid factor -     ANA -     predniSONE; Take 1 tablet (20 mg total) by mouth daily with breakfast for 5 days.  Dispense: 5 tablet; Refill: 0 -      Ferritin -     Anti-nuclear ab-titer (ANA titer)  Trigger finger of right thumb Assessment & Plan: Follow up with orthopedics for trigger thumb, can offer steroid injections. Recommend thumb splint and rest for trigger thumb    PDMP reviewed  Return if symptoms worsen or fail to improve.  Dana Allan, MD

## 2022-11-09 NOTE — Patient Instructions (Addendum)
It was a pleasure meeting you today. Thank you for allowing me to take part in your health care.  Our goals for today as we discussed include:  We will get some labs today.  If they are abnormal or we need to do something about them, I will call you.  If they are normal, I will send you a message on MyChart (if it is active) or a letter in the mail.  If you don't hear from [place] in 2 weeks, please call the office at the number below.   Start Prednisone 20 mg daily  Call orthopedics.  If you need referral would be happy to send to hand specialist   If you have any questions or concerns, please do not hesitate to call the office at [phone].  I look forward to our next visit and until then take care and stay safe.  Regards,   Dana Allan, MD   [facility]

## 2022-11-10 LAB — CBC WITH DIFFERENTIAL/PLATELET
Basophils Absolute: 0.1 10*3/uL (ref 0.0–0.1)
Basophils Relative: 1.6 % (ref 0.0–3.0)
Eosinophils Absolute: 0.1 10*3/uL (ref 0.0–0.7)
Eosinophils Relative: 2.3 % (ref 0.0–5.0)
HCT: 41.9 % (ref 36.0–46.0)
Hemoglobin: 14.1 g/dL (ref 12.0–15.0)
Lymphocytes Relative: 30.4 % (ref 12.0–46.0)
Lymphs Abs: 1.9 10*3/uL (ref 0.7–4.0)
MCHC: 33.7 g/dL (ref 30.0–36.0)
MCV: 95.1 fl (ref 78.0–100.0)
Monocytes Absolute: 0.5 10*3/uL (ref 0.1–1.0)
Monocytes Relative: 8.7 % (ref 3.0–12.0)
Neutro Abs: 3.6 10*3/uL (ref 1.4–7.7)
Neutrophils Relative %: 57 % (ref 43.0–77.0)
Platelets: 247 10*3/uL (ref 150.0–400.0)
RBC: 4.4 Mil/uL (ref 3.87–5.11)
RDW: 14.3 % (ref 11.5–15.5)
WBC: 6.3 10*3/uL (ref 4.0–10.5)

## 2022-11-10 LAB — URIC ACID: Uric Acid, Serum: 2.9 mg/dL (ref 2.4–7.0)

## 2022-11-10 LAB — FERRITIN: Ferritin: 45.8 ng/mL (ref 10.0–291.0)

## 2022-11-10 LAB — SEDIMENTATION RATE: Sed Rate: 7 mm/hr (ref 0–30)

## 2022-11-10 LAB — C-REACTIVE PROTEIN: CRP: <1 mg/dL (ref 0.5–20.0)

## 2022-11-11 LAB — ANA: Anti Nuclear Antibody (ANA): POSITIVE — AB

## 2022-11-11 LAB — ANTI-NUCLEAR AB-TITER (ANA TITER): ANA Titer 1: 1:80 {titer} — ABNORMAL HIGH

## 2022-11-11 LAB — RHEUMATOID FACTOR: Rheumatoid fact SerPl-aCnc: <10 IU/mL (ref <14)

## 2022-11-12 ENCOUNTER — Encounter: Payer: Self-pay | Admitting: Family Medicine

## 2022-11-15 ENCOUNTER — Ambulatory Visit (INDEPENDENT_AMBULATORY_CARE_PROVIDER_SITE_OTHER): Payer: Medicare Other

## 2022-11-15 DIAGNOSIS — M65311 Trigger thumb, right thumb: Secondary | ICD-10-CM | POA: Diagnosis not present

## 2022-11-15 DIAGNOSIS — M13842 Other specified arthritis, left hand: Secondary | ICD-10-CM | POA: Diagnosis not present

## 2022-11-15 DIAGNOSIS — M79641 Pain in right hand: Secondary | ICD-10-CM | POA: Diagnosis not present

## 2022-11-15 DIAGNOSIS — I495 Sick sinus syndrome: Secondary | ICD-10-CM | POA: Diagnosis not present

## 2022-11-15 LAB — CUP PACEART REMOTE DEVICE CHECK
Battery Remaining Longevity: 38 mo
Battery Remaining Percentage: 31 %
Battery Voltage: 2.95 V
Brady Statistic AP VP Percent: 1 %
Brady Statistic AP VS Percent: 12 %
Brady Statistic AS VP Percent: 1 %
Brady Statistic AS VS Percent: 88 %
Brady Statistic RA Percent Paced: 12 %
Brady Statistic RV Percent Paced: 1 %
Date Time Interrogation Session: 20240612031808
Implantable Lead Connection Status: 753985
Implantable Lead Connection Status: 753985
Implantable Lead Implant Date: 20151120
Implantable Lead Implant Date: 20151120
Implantable Lead Location: 753862
Implantable Lead Location: 753862
Implantable Pulse Generator Implant Date: 20151120
Lead Channel Impedance Value: 430 Ohm
Lead Channel Impedance Value: 600 Ohm
Lead Channel Pacing Threshold Amplitude: 0.75 V
Lead Channel Pacing Threshold Amplitude: 0.75 V
Lead Channel Pacing Threshold Pulse Width: 0.5 ms
Lead Channel Pacing Threshold Pulse Width: 0.5 ms
Lead Channel Sensing Intrinsic Amplitude: 2.1 mV
Lead Channel Sensing Intrinsic Amplitude: 6.4 mV
Lead Channel Setting Pacing Amplitude: 2 V
Lead Channel Setting Pacing Amplitude: 2 V
Lead Channel Setting Pacing Pulse Width: 0.5 ms
Lead Channel Setting Sensing Sensitivity: 0.5 mV
Pulse Gen Model: 2240
Pulse Gen Serial Number: 7689719

## 2022-11-16 DIAGNOSIS — M5416 Radiculopathy, lumbar region: Secondary | ICD-10-CM | POA: Diagnosis not present

## 2022-11-16 NOTE — Therapy (Signed)
OUTPATIENT PHYSICAL THERAPY EVALUATION   Patient Name: Kim Pennington MRN: [id number] DOB:1956/04/15, 67 y.o., female Today's Date: 11/23/2022  END OF SESSION:  PT End of Session - 11/23/22 1618     Visit Number 1    Number of Visits 13    Date for PT Re-Evaluation 02/15/23    Authorization Type [employer] MEDICARE reporting period from 11/23/2022    Progress Note Due on Visit 10    PT Start Time 1430    PT Stop Time 1515    PT Time Calculation (min) 45 min    Activity Tolerance Patient tolerated treatment well    Behavior During Therapy WFL for tasks assessed/performed             Past Medical History:  Diagnosis Date   Acquired solitary kidney    right nephrectomy   Anemia    Anxiety    Arthritis    C. difficile diarrhea    Depression    Hepatitis B    Hyperlipidemia    Hypertension    Kidney disease, chronic, stage III (GFR 30-59 ml/min) (HCC)    Kidney stones    Lymphocytic colitis    Memory loss    Osteoporosis    Pacemaker    Renal failure, chronic, stage 3 (moderate) (HCC) 2015   Left kidney    Skin cancer    Past Surgical History:  Procedure Laterality Date   ABDOMINAL HYSTERECTOMY  11/2006   ABDOMINOPLASTY     BREAST LUMPECTOMY Right 1979   CARDIAC CATHETERIZATION  04/24/2014   CARPAL TUNNEL RELEASE Bilateral 03/29/2009   CESAREAN SECTION     x4   COLONOSCOPY     FOOT SURGERY Bilateral    HIP ARTHROSCOPY Right 09/14/2016   reduction of lesser trochanter    HIP ARTHROSCOPY Left 04/25/2016   w/ femoroplasty   left thumb tendon  04/25/2016   LIPOSUCTION     LUMBAR LAMINECTOMY  11/23/2019   foraminotomy, discectomy, facectomy   NEPHRECTOMY Right 2009   PACEMAKER IMPLANT  04/24/2014   RHINOPLASTY     x 2, 1999, 2003   ROTATOR CUFF REPAIR Right    SHOULDER ARTHROSCOPY Right 12/30/2018   x2   TONSILLECTOMY  1961   UMBILICAL HERNIA REPAIR  1998   UPPER GASTROINTESTINAL ENDOSCOPY     WEIL OSTEOTOMY Bilateral 11/27/2019   Patient Active Problem  List   Diagnosis Date Noted   Arthralgia of right hand 11/19/2022   Trigger finger of right thumb 11/19/2022   Low vitamin B12 level 09/30/2022   Encounter for hepatitis C screening test for low risk patient 09/30/2022   Postmenopausal estrogen deficiency 09/30/2022   Breast cancer screening by mammogram 09/30/2022   Syncope 05/18/2022   Glucosuria 05/18/2022   Chronic diastolic CHF (congestive heart failure) (HCC) 05/18/2022   Anxiety 05/18/2022   Lumbar back pain with radiculopathy affecting left lower extremity 01/20/2022   Perineal numbness 01/20/2022   History of back surgery 01/20/2022   Oral herpes 10/11/2021   Osteoarthritis of knees, bilateral 09/06/2021   Seborrheic keratoses 02/01/2021   Chest pain 02/01/2021   Anemia 11/08/2020   History of melanoma 08/10/2020   Scoliosis 06/29/2020   Hormone replacement therapy (HRT) 06/29/2020   Lymphocytic colitis 06/29/2020   Chronic foot pain 06/29/2020   Left flank discomfort 04/10/2020   H/O right nephrectomy 04/10/2020   Chronic renal insufficiency, stage III (moderate) (HCC) 10/02/2018   Gastroesophageal reflux disease 10/02/2018   Hypertension 10/02/2018   Insomnia 10/02/2018  Pacemaker 10/02/2018   Osteoporosis 10/02/2018   Sinoatrial node dysfunction (HCC) 10/02/2018   Recurrent major depressive disorder (HCC) 10/02/2018   Posttraumatic stress disorder 10/02/2018   Cobalamin deficiency 10/02/2018   Arthritis 10/02/2018   Thoracic spine pain 10/02/2018   Enthesopathy of hip region 10/02/2018   Migraine headache 11/18/2012   Left flank pain 06/19/2012    PCP: Dana Allan, MD  REFERRING PROVIDER: Hector Shade, MD  REFERRING DIAG: peroneal tendinitis of left lower extremity  Rationale for Evaluation and Treatment: Rehabilitation  THERAPY DIAG:  Pain in left ankle and joints of left foot  Pain in right ankle and joints of right foot  Neuralgia and neuritis  ONSET DATE: about 8 years ago, surgery  in 2021  SUBJECTIVE:                                                                                                                                                                                           SUBJECTIVE STATEMENT: Patient states she is at physical therapy to address bilateral foot pain. She states her pain in both feet started about 8 years ago. She states she had bilateral foot surgery about 3.5 years ago (Weil Osteotomy). She states it was textbook successful. Her feet felt better for a couple of months after the foot surgery, then it progressively got worse. She states the pain continues to get worse, and now her pain is much worse. She states her pain in both feet  resolved somewhat after her surgery but she feels she was on her feet too early helping her husband with his cervical spine fusion. It feels like the pain is gradually coming up both legs to the knee, especially the left side. Her left toes are starting to splay out, and her right toes are curling. She states she feels like she is walking on broken glass. She cannot get more than about 2 hours of sleep at a time because of the pain in her foot. She gets cramps in her feet and great toe bilaterally.  She had surgery scheduled for her left foot in June with Dr. Pernell Dupre, but he wanted her to explore other avenues when it looked likely that she would be diagnosed with rheumatoid arthritis. Patient states she is currently having a workup done for rheumatoid arthritis. She states there is indicators in her blood that this is the case.   She states she has had some neuropathy that was residual from L3-5 laminectomy about 12 years ago. She has gone off 7 medications since March of this year after she lost 15 lbs and doing a new health program. She does  have some trouble with her back now. She is seeing a back specialist for that. She got a double injection in her back last week. This did not have any significant effect on her leg and feet  pain. Her current pain from her back shoots down from her glutes to the back of her knees on her posterior thighs. She has not noticed a change in this pain with the injections either. Prior to her back surgery she had pain going down both legs to her toes, and the surgery significantly helped it. She states has scoliosis as well.   She had been taking medication sleep, but trying not to. The reason she cannot sleep is the foot pain she experiences at night. She has not taken any medications specifically for her feet. Gabapentin was not effective for her back in the past. She was given injections in her feet by a podiatrist, and they were very painful but not effective. She has not tried any other treatments.   She has a pacemaker that makes it difficult to get an MRI.    PERTINENT HISTORY:  Patient is a 67 y.o. female who presents to outpatient physical therapy with a referral for medical diagnosis peroneal tendinitis of left lower extremity. This patient's chief complaints consist of bilateral foot and lower leg pain, leading to the following functional deficits: difficulty with patience and keeping up with all of her many responsibilities while dealing with constant pain, sleeping, working, standing, walking, sitting, working out, spending time with grandkids, sports, caring for husband, all aspects of life. Relevant past medical history and comorbidities include Left flank discomfort; H/O right nephrectomy; Chronic renal insufficiency, stage III (moderate) (HCC); Gastroesophageal reflux disease; Hypertension; Insomnia; Migraine headache; Pacemaker; Osteoporosis; Sinoatrial node dysfunction (HCC); Recurrent major depressive disorder (HCC); Posttraumatic stress disorder; Cobalamin deficiency; Arthritis; Thoracic spine pain; Scoliosis; Hormone replacement therapy (HRT); Lymphocytic colitis; Chronic foot pain; Enthesopathy of hip region; Left flank pain; History of melanoma (last year was the most recent -  no chemo or radiation); Anemia; Seborrheic keratoses; Chest pain; Oral herpes; Lumbar back pain with radiculopathy affecting left lower extremity; Perineal numbness; History of back surgery; Syncope; Glucosuria; Chronic diastolic CHF (congestive heart failure) (HCC); Anxiety; Osteoarthritis of knees, bilateral; Low vitamin B12 level; Postmenopausal estrogen deficiency; abdominal hysterectomy, breast lumpectomy (1979), bilateral carpal tunnel release, c-section, bilateral foot surgery, bilaterally hip arthroscopy (doing well), lumbar laminectomy, R RTC repair, umbilical hernia repair, Weil Osteotomy (bilateral 11/27/2019). Patient denies hx of stroke, seizures, lung problems, diabetes, unexplained weight loss, unexplained changes in bowel or bladder problems, and unexplained stumbling or dropping things.  PAIN:  Are you having pain? Yes: NPRS scale: Current: 6/10,  Best: 3/10, Worst: 10+/10. Pain location: bottoms of both feet, over the the dorsal and medial aspects of both feet and great toes, and travels over the medial ankle and up to just proximal to the patella on the medial lower legs, but at anterior distal quad in the knees.  Pain description: "like walking on glass" constant burning, like having neuropathy.  Aggravating factors: nothing makes it worse Relieving factors: stop and wash her feet and rub them   FUNCTIONAL LIMITATIONS: difficulty with patience and keeping up with all of her many responsibilities while dealing with constant pain, sleeping, working, standing, walking, sitting, working out, spending time with grandkids, sports, caring for husband, all aspects of life.   LEISURE: working out, all sports, being outdoors with family, doing handwork, kids, grandkids.   PRECAUTIONS: None  WEIGHT BEARING RESTRICTIONS:  No  FALLS:  Has patient fallen in last 6 months? No Not concerned about falling.   LIVING ENVIRONMENT: Lives with: husband who she helps care for and their cat Lives  in: 2 story home Stairs: 3 steps to enter with left handrail, 15 steps on the left. Does not use assistive device.   OCCUPATION: part time health coach (online, computer work, English as a second language teacher), teaching music (piano, violin, voice, standing), retired from [employer] caring for husband who falls a lot.   PLOF: Independent  PATIENT GOALS: "to alleviate some of the pain"   OBJECTIVE  DIAGNOSTIC FINDINGS:  L forefoot MRI report from 07/05/2021:  MRI LEFT FOREFOOT WITHOUT CONTRAST, 07/04/2021 9:50 AM   INDICATION:  PACEMAKER \ M77.52 Capsulitis of metatarsophalangeal (MTP) joint of left foot  COMPARISON:  None.   TECHNIQUE:  Multiplanar, multi-sequence surface-coil imaging of the left forefoot was performed without contrast.   FINDINGS:  . Moderate degenerative changes of the first MTP joint including the sesamoids. The medial sesamoid is tripartite and demonstrates edema-like signal suggestive of sesamoiditis.  . Pressure related soft tissue changes medially.  . Ganglion cyst at the first metatarsal medial eminence can be seen with hallux valgus.  . Micrometallic artifact at the second MTP joint and presumed osteotomy defect in the second metatarsal head. Fluid signal present within the presumed osteotomy defect and within the metatarsal head fragment. The metatarsal head fragment 2-3 mm is subluxed medially at the fluid-filled osteotomy site and nearly abuts the first MTP joint. Synovitis, granulation tissue, and/or fibrosis surrounds the second MTP joint; assessment limited by the hardware artifact.  . Postoperative changes of the third metatarsal suggestive of third metatarsal head osteotomy similar to the second metatarsal. No abnormal marrow signal. No fluid in the osteotomy defect. No displacement of the metatarsal head fragment in the axial plane. Limited evaluation secondary to hardware artifact.  . Severe degenerative changes of the medial naviculocuneiform joint. Mild  degenerative changes of the remainder of the visualized midfoot.   IMPRESSION:   1. Status post second and third metatarsal head osteotomies. Fluid signal and medial subluxation of the second metatarsal head fragment, reflects either unhealed or fracture osteotomy. Edema in the third metatarsal head fragment is either reactive and secondary to micromotion, bone contusion, though early/active osteonecrosis may also demonstrate this MR appearance. Synovitis, granulation tissue, and/or capsular fibrosis of the postoperative second MTP joint.  2.  Third metatarsal head osteotomy without gross complication.  3.  Medial hallux sesamoiditis, either degenerative or chronic stress-related.  4.  Severe medial naviculocuneiform degenerative changes.  5.  Cystic changes of the first metatarsal median eminence dorsomedially suggests of hallux valgus, within limitations of this nonweightbearing imaging examination.   Addendum: Consider radiographic correlation and comparison with prior outside  imaging, if available, for further evaluation, if this information will  affect management.    R forefoot MRI report from 07/05/2021 MRI RIGHT FOREFOOT WITHOUT CONTRAST, 07/04/2021 9:50 AM   INDICATION:  PACEMAKER \ M77.51 Capsulitis of metatarsophalangeal (MTP) joint of right foot  COMPARISON:  None.   TECHNIQUE:  Multiplanar, multi-sequence surface-coil imaging of the right forefoot was performed without contrast.   FINDINGS:  . Edema-like signal of the medial and lateral hallux sesamoids suggestive of sesamoiditis.  . Pressure related soft tissue changes of the medial eminence of the first metatarsal head.  . Likely chronic bony remodeling of the first metatarsal head at the medial capsular/collateral ligament attachment, possibly related to hallux valgus.  . Moderate degenerative changes of  the first MTP joint.  . Status post second and third metatarsal head osteotomies. Degenerative subchondral cyst of the  base of the second proximal phalanx with surrounding ill-defined edema-like marrow signal throughout the rest of the proximal end segment and extending into the diaphysis may reflect stress reaction or sequelae of recent trauma or active repetitive microtrauma.  . Second and third metatarsal convergence with the heads nearly abutting each other. Synovitis, granulation tissue, and/or capsular fibrosis surrounding the second MTP joint. Moderate degenerative changes of the second MTP joint.   IMPRESSION:   1. Status post second and third metatarsal head osteotomies. Degenerative and/or stress-related changes to the proximal end of the second proximal phalanx. No fracture. Consider radiographic examination for correlation.  2.  Synovitis, granulation tissue, and/or (less favored) fibrosis around the second MTP joint.  3.  Medial and lateral hallux sesamoiditis, either degenerative or stress-related.  4.  Bony remodeling of the first metatarsal head suggestive of hallux valgus within limits of a nonweightbearing imaging examination.   Lumbar MRI report from 09/25/2022:  MRI LUMBAR SPINE WITHOUT CONTRAST, 09/25/2022 1:47 PM   INDICATION: Lumbago with sciatica, unspecified side \ M54.40 Lumbago with sciatica, unspecified side   COMPARISON: None   TECHNIQUE: Multiplanar, multisequence surface-coil magnetic resonance imaging of the lumbar spine was performed without contrast.    LEVELS IMAGED: Lower thoracic region to upper sacrum.    FINDINGS:   Transitional lumbosacral anatomy with lumbarized S1 vertebral body.   Alignment: Levoconvex curvature of the lumbar spine. Slight anterolisthesis of L5 on S1.   Vertebrae: Vertebral body heights are maintained. No marrow signal abnormalities to suggest neoplasm.   Conus medullaris: In normal position, terminating at the level of the mid L2 vertebral body.. Normal signal and contour.   Degenerative changes:   T12-L1: No substantial canal or foraminal  stenosis.  L1-L2: No substantial canal or foraminal stenosis.  L2-L3: No substantial canal or foraminal stenosis.  L3-L4: Broad-based disc bulge with few marginal endplate osteophytes. Ligamentum flavum thickening and mild degenerative facet hypertrophy. Degenerative changes contribute to mild crowding of the bilateral subarticular zones and mild effacement of the inferior aspect of the right L3-L4 neural foramen.  L4-L5: Broad-based disc bulge, ligamentum flavum thickening, and left greater than right degenerative facet disease. Findings contribute to crowding of the bilateral subarticular zones, left greater than right, with contact of the descending left L5 nerve roots. There is mild left eccentric spinal canal stenosis and mild left neural foraminal stenosis.  L5-S1: Laminectomy changes. Slight anterolisthesis with broad-based posterior disc bulge. Moderate degenerative facet hypertrophy. Degenerative findings contribute to moderate crowding of the bilateral subarticular zones with close abutment of the bilateral descending S1 nerve roots. Canal stenosis is mild and neural foraminal stenosis is mild bilaterally.  S1-S2: Minimal posterior disc bulge which mildly crowds the bilateral subarticular zones. No substantial spinal canal stenosis.   Upper sacrum: No focal lesion identified.   Additional findings: Multiloculated cystic appearing structure involving the left renal sinus, previously described as pelvocaliectasis on outside imaging reports from 2021.   IMPRESSION:  Transitional lumbosacral anatomy with lumbarized S1 vertebral body. If surgical intervention is planned, recommend correlation with the numbering convention annotated in PACS.   1.  No high grade canal or foraminal stenosis within the lumbar spine.  2.  Mild spinal canal stenosis at L5-S1. Additional degenerative changes as described in the body of the report.  3.  Findings suggestive of left UPJ obstruction, previously described  on outside imaging reports dating back  to 2021. Renal ultrasound or repeat CT could further evaluate.   See care everywhere for left and right xray reports from 06/13/2022.   OBSERVATION/INSPECTION Posture Posture (seated): forward head, rounded shoulders, slumped in sitting.  Posture (standing): left lesser toes with lateral drift, right lesser toes with clawing, hindfoot fairly neutral bilaterally, well preserved arches, increased joint size in several toe and foot joints.  Anthropometrics Tremor: none Body composition: WNL Muscle bulk: excellent throughout body, WNL in feet.  Skin: skin break at R 2nd toe, does not appear to have excessive redness, swelling, or warmth suggesting infection.  Edema: none Functional Mobility Bed mobility: supine <> sit I Transfers: sit <> stand I Gait: grossly WFL for household and short community ambulation. More detailed gait analysis deferred to later date as needed.   SPINE MOTION  LUMBAR SPINE AROM *Indicates pain Flexion: hands to floor Extension: WFL, increased pain in feet, R > L Side Flexion:   R WFL  L WFL Rotation:  R WFL L WFL   NEUROLOGICAL Patient with decreased sensation to light touch over entire foot and lower leg as well as distal thigh bilaterally. She started to have more normal sensation about mid thigh and proximally. There was no dermatomal pattern to sensation loss.   PERIPHERAL JOINT MOTION (in degrees)  ACTIVE RANGE OF MOTION (AROM) *Indicates pain 11/23/22 Date Date  Joint/Motion R/L R/L R/L  Ankle/Foot     Dorsiflexion (knee ext) 18/ / /  Plantarflexion 55/34 / /  Everison 55/44 / /  Inversion 18/22 / /  Great toe extension 80/90 / /  Great toe flexion / / /  Comments:  11/23/2022: patient reports pain during all movements.   PASSIVE RANGE OF MOTION (PROM) *Indicates pain 11/23/22 Date Date  Joint/Motion R/L R/L R/L  Ankle/Foot     Dorsiflexion (knee ext) / / /  Dorsiflexion (knee flex) / / /  Great toe  extension 80/90 / /  Comments:  11/23/2022: patient reports pain during all movements  MUSCLE PERFORMANCE (MMT):  *Indicates pain 11/23/22 Date Date  Joint/Motion R/L R/L R/L  Ankle/Foot     Dorsiflexion (L4) 5/5 / /  Great toe extension (L5) 4+/4+ / /  Eversion (S1) 5/5 / /  Plantarflexion (S1) 4+/4 / /  Inversion 5/5 / /  Pronation 4/4- / /  Comments:  11/23/2022: patient reports pain during all movements.   SPECIAL TESTS:  LOWER LIMB NEURODYNAMIC TESTS Straight Leg Raise (Sciatic nerve) R  = increased concordant foot pain, increased with hip ER and adduction.  L  = increased concordant foot pain, increased with hip ER and adduction, plantarflexion.   Tinel's Test at tarsal tunnel: positive bilaterally Metatarsal Squeeze testing: painful but not specific.   ACCESSORY MOTION: CPA to mid lumbar spine increases foot symptoms, L > R  PALPATION: Feet and lower legs tender to palpation generally.   FUNCTIONAL/BALANCE TESTS: Single leg heel raise: 30 + reps each side Single leg stance: > 30 seconds both sides.     TODAY'S TREATMENT:   Therapeutic exercise: to centralize symptoms and improve ROM, strength, muscular endurance, and activity tolerance required for successful completion of functional activities.  - seated Toe splays, repeated 3 sec holds. Ball of foot and heel maintains contact with floor. To improve intrinsic foot muscle activation and strength in order to better support arch and intrinsic foot structures. 1x5 B LE - seated Toe Yoga: great toe extension with small toes flexion pressure into floor, repeated 3 sec holds;  small toes extension with great toe flexion pressure into floor, repeated 3 sec holds. Ball of foot and heel maintains contact with floor. To improve intrinsic foot muscle activation and strength in order to better support arch and intrinsic foot structures. 1x5 each direction.  - Education on diagnosis, prognosis, POC, anatomy and physiology of current  condition.  - Education on HEP including handout   Pt required multimodal cuing for proper technique and to facilitate improved neuromuscular control, strength, range of motion, and functional ability resulting in improved performance and form.   PATIENT EDUCATION:  Education details: Exercise purpose/form. Self management techniques. Education on diagnosis, prognosis, POC, anatomy and physiology of current condition Education on HEP including handout. Person educated: Patient Education method: Explanation, Demonstration, Tactile cues, Verbal cues, and Handouts Education comprehension: verbalized understanding, returned demonstration, and needs further education  HOME EXERCISE PROGRAM: Access Code: [email or web address] URL: [email or web address] Date: 11/23/2022 Prepared by: Norton Blizzard  Exercises - Toe Yoga - Alternating Great Toe and Lesser Toe Extension  - 1 x daily - 1 sets - 20 reps - 4 seconds hold - Toe Spreading  - 1 x daily - 1 sets - 20 reps - 4 seconds hold   ASSESSMENT:  CLINICAL IMPRESSION: Patient is a 67 y.o. female referred to outpatient physical therapy with a medical diagnosis of peroneal tendinitis of left lower extremity who presents with signs and symptoms consistent with bilateral foot and lower leg pain with potentially multi-factorial causes. Pain description and stocking pattern of sensory loss suggests neuropathy. Patient also has evidence of widespread arthritic changes with possible rheumatoid arthritis, chronic lumbar pain with increased concordant pain at feet with CPA to mid to upper lumbar spine which suggests lumbar contribution. Patient may benefit from improving intrinsic foot muscle strength, and exploring other interventions for pain relief such as neurodynamics, load management, and desensitization techniques.  Patient presents with significant pain, ROM, joint stiffness, posture, neurodynamic, motor control, sensation, muscle performance  (strength/power/endurance), and activity tolerance impairments that are limiting ability to complete her usual activities such as keeping up with all of her many responsibilities while dealing with constant pain, sleeping, working, standing, walking, sitting, working out, spending time with grandkids, sports, caring for husband, all aspects of life without difficulty. Patient will benefit from skilled physical therapy intervention to address current body structure impairments and activity limitations to improve function and work towards goals set in current POC in order to return to prior level of function or maximal functional improvement.  OBJECTIVE IMPAIRMENTS: decreased activity tolerance, decreased coordination, decreased endurance, decreased knowledge of condition, difficulty walking, decreased ROM, decreased strength, hypomobility, impaired sensation, and pain.   ACTIVITY LIMITATIONS: sitting, standing, sleeping, and   difficulty with patience and keeping up with all of her many responsibilities while dealing with constant pain, sleeping, working, standing, walking, sitting, working out, spending time with grandkids, sports, caring for husband, all aspects of life  PARTICIPATION LIMITATIONS: meal prep, cleaning, laundry, interpersonal relationship, driving, shopping, and community activity  PERSONAL FACTORS: Past/current experiences, Time since onset of injury/illness/exacerbation, and 3+ comorbidities:   Left flank discomfort; H/O right nephrectomy; Chronic renal insufficiency, stage III (moderate) (HCC); Gastroesophageal reflux disease; Hypertension; Insomnia; Migraine headache; Pacemaker; Osteoporosis; Sinoatrial node dysfunction (HCC); Recurrent major depressive disorder (HCC); Posttraumatic stress disorder; Cobalamin deficiency; Arthritis; Thoracic spine pain; Scoliosis; Hormone replacement therapy (HRT); Lymphocytic colitis; Chronic foot pain; Enthesopathy of hip region; Left flank pain; History  of melanoma (last year was the most recent - no chemo or radiation); Anemia; Seborrheic  keratoses; Chest pain; Oral herpes; Lumbar back pain with radiculopathy affecting left lower extremity; Perineal numbness; History of back surgery; Syncope; Glucosuria; Chronic diastolic CHF (congestive heart failure) (HCC); Anxiety; Osteoarthritis of knees, bilateral; Low vitamin B12 level; Postmenopausal estrogen deficiency; abdominal hysterectomy, breast lumpectomy (1979), bilateral carpal tunnel release, c-section, bilateral foot surgery, bilaterally hip arthroscopy (doing well), lumbar laminectomy, R RTC repair, umbilical hernia repair, Weil Osteotomy (bilateral 11/27/2019) are also affecting patient's functional outcome.   REHAB POTENTIAL: Fair due to nature and severity of symptoms and patient's baseline high level of fitness and care for her health.   CLINICAL DECISION MAKING: Evolving/moderate complexity  EVALUATION COMPLEXITY: Moderate   GOALS: Goals reviewed with patient? No  SHORT TERM GOALS: Target date: 12/07/2022  Patient will be independent with initial home exercise program for self-management of symptoms. Baseline: Initial HEP provided at [facility] (11/23/22); Goal status: INITIAL   LONG TERM GOALS: Target date: 02/15/2023  Patient will be independent with a long-term home exercise program for self-management of symptoms.  Baseline: Initial HEP provided at [facility] (11/23/22); Goal status: INITIAL  2.  Patient will demonstrate improved FOTO by equal or greater than 10 points by visit #10 to demonstrate improvement in overall condition and self-reported functional ability.  Baseline: to be measured visit 2 as appropriate (11/23/22); Goal status: INITIAL  3.  Patient will report being able to sleep through the night with 75% less limitation due to B foot/lower leg pain.  Baseline: limited to 2 hours due to foot pain (11/23/22); Goal status: INITIAL  4.  Patient will demonstrate ability to perform  toe yoga and splays with good motor control and strength while in short foot position in standing to demonstrate improved intrinsic foot muscle control for improve ability to complete standing and weight bearing tasks.  Baseline: 11/23/2022 (11/23/22); Goal status: INITIAL  5.  Patient will complete community, work and/or recreational activities with 50% less limitation due to current condition.  Baseline: difficulty with patience and keeping up with all of her many responsibilities while dealing with constant pain, sleeping, working, standing, walking, sitting, working out, spending time with grandkids, sports, caring for husband, all aspects of life (11/23/22); Goal status: INITIAL  6.  Patient will report pain no greater than 3/10 during functional activities to demonstrate improved ability to complete daily activities with less frustration.  Baseline: up to 10+/10 (11/23/2022);  Goal status: INITIAL   PLAN:  PT FREQUENCY: 1-2x/week  PT DURATION: 12 weeks  PLANNED INTERVENTIONS: Therapeutic exercises, Therapeutic activity, Neuromuscular re-education, Balance training, Gait training, Patient/Family education, Self Care, Joint mobilization, Aquatic Therapy, Dry Needling, Electrical stimulation, Spinal mobilization, Cryotherapy, Moist heat, Manual therapy, and Re-evaluation.  PLAN FOR NEXT SESSION: update HEP as appropriate, progressive intrinsic foot/LE/functional strengthening as tolerated, neurodynamics, desensitization techniques, education, manual therapy/dry needling as needed.    Cira Rue, PT, DPT 11/23/2022, 7:35 PM   [facility] [address] [place], [place] [address] P: [phone] I F: [phone]

## 2022-11-18 ENCOUNTER — Other Ambulatory Visit: Payer: Self-pay | Admitting: Family Medicine

## 2022-11-18 DIAGNOSIS — M25541 Pain in joints of right hand: Secondary | ICD-10-CM

## 2022-11-18 DIAGNOSIS — R768 Other specified abnormal immunological findings in serum: Secondary | ICD-10-CM

## 2022-11-19 ENCOUNTER — Encounter: Payer: Self-pay | Admitting: Family Medicine

## 2022-11-19 DIAGNOSIS — M25541 Pain in joints of right hand: Secondary | ICD-10-CM | POA: Insufficient documentation

## 2022-11-19 DIAGNOSIS — M65311 Trigger thumb, right thumb: Secondary | ICD-10-CM | POA: Insufficient documentation

## 2022-11-19 NOTE — Assessment & Plan Note (Signed)
Follow up with orthopedics for trigger thumb, can offer steroid injections. Recommend thumb splint and rest for trigger thumb

## 2022-11-19 NOTE — Assessment & Plan Note (Addendum)
Symptoms x 2 months and not improving.  No indication for xray given no recent trauma.   Check CBC, CRP, ESR, ANA, Uric Acid Start Prednisone 20 mg daily If labs positive for rheumatological markers will refer to Rheumatology for evaluation given family history of RA

## 2022-11-23 ENCOUNTER — Ambulatory Visit: Payer: Medicare Other | Attending: Orthopedic Surgery | Admitting: Physical Therapy

## 2022-11-23 ENCOUNTER — Encounter: Payer: Self-pay | Admitting: Physical Therapy

## 2022-11-23 DIAGNOSIS — M064 Inflammatory polyarthropathy: Secondary | ICD-10-CM | POA: Diagnosis not present

## 2022-11-23 DIAGNOSIS — M25571 Pain in right ankle and joints of right foot: Secondary | ICD-10-CM | POA: Diagnosis not present

## 2022-11-23 DIAGNOSIS — M25572 Pain in left ankle and joints of left foot: Secondary | ICD-10-CM | POA: Diagnosis not present

## 2022-11-23 DIAGNOSIS — M2550 Pain in unspecified joint: Secondary | ICD-10-CM | POA: Diagnosis not present

## 2022-11-23 DIAGNOSIS — Z111 Encounter for screening for respiratory tuberculosis: Secondary | ICD-10-CM | POA: Diagnosis not present

## 2022-11-23 DIAGNOSIS — M792 Neuralgia and neuritis, unspecified: Secondary | ICD-10-CM | POA: Insufficient documentation

## 2022-11-23 DIAGNOSIS — Z79899 Other long term (current) drug therapy: Secondary | ICD-10-CM | POA: Diagnosis not present

## 2022-11-23 DIAGNOSIS — M7989 Other specified soft tissue disorders: Secondary | ICD-10-CM | POA: Diagnosis not present

## 2022-11-23 DIAGNOSIS — H04123 Dry eye syndrome of bilateral lacrimal glands: Secondary | ICD-10-CM | POA: Diagnosis not present

## 2022-11-23 DIAGNOSIS — R768 Other specified abnormal immunological findings in serum: Secondary | ICD-10-CM | POA: Diagnosis not present

## 2022-11-23 NOTE — Therapy (Signed)
OUTPATIENT PHYSICAL THERAPY TREATMENT NOTE   Patient Name: Kim Pennington MRN: [id number] DOB:29-Jul-1955, 67 y.o., female Today's Date: 11/27/2022  END OF SESSION:  PT End of Session - 11/27/22 1834     Visit Number 2    Number of Visits 13    Date for PT Re-Evaluation 02/15/23    Authorization Type [employer] MEDICARE reporting period from 11/23/2022    Progress Note Due on Visit 10    PT Start Time 1735    PT Stop Time 1815    PT Time Calculation (min) 40 min    Activity Tolerance Patient tolerated treatment well    Behavior During Therapy WFL for tasks assessed/performed              Past Medical History:  Diagnosis Date   Acquired solitary kidney    right nephrectomy   Anemia    Anxiety    Arthritis    C. difficile diarrhea    Depression    Hepatitis B    Hyperlipidemia    Hypertension    Kidney disease, chronic, stage III (GFR 30-59 ml/min) (HCC)    Kidney stones    Lymphocytic colitis    Memory loss    Osteoporosis    Pacemaker    Renal failure, chronic, stage 3 (moderate) (HCC) 2015   Left kidney    Skin cancer    Past Surgical History:  Procedure Laterality Date   ABDOMINAL HYSTERECTOMY  11/2006   ABDOMINOPLASTY     BREAST LUMPECTOMY Right 1979   CARDIAC CATHETERIZATION  04/24/2014   CARPAL TUNNEL RELEASE Bilateral 03/29/2009   CESAREAN SECTION     x4   COLONOSCOPY     FOOT SURGERY Bilateral    HIP ARTHROSCOPY Right 09/14/2016   reduction of lesser trochanter    HIP ARTHROSCOPY Left 04/25/2016   w/ femoroplasty   left thumb tendon  04/25/2016   LIPOSUCTION     LUMBAR LAMINECTOMY  11/23/2019   foraminotomy, discectomy, facectomy   NEPHRECTOMY Right 2009   PACEMAKER IMPLANT  04/24/2014   RHINOPLASTY     x 2, 1999, 2003   ROTATOR CUFF REPAIR Right    SHOULDER ARTHROSCOPY Right 12/30/2018   x2   TONSILLECTOMY  1961   UMBILICAL HERNIA REPAIR  1998   UPPER GASTROINTESTINAL ENDOSCOPY     WEIL OSTEOTOMY Bilateral 11/27/2019   Patient Active  Problem List   Diagnosis Date Noted   Arthralgia of right hand 11/19/2022   Trigger finger of right thumb 11/19/2022   Low vitamin B12 level 09/30/2022   Encounter for hepatitis C screening test for low risk patient 09/30/2022   Postmenopausal estrogen deficiency 09/30/2022   Breast cancer screening by mammogram 09/30/2022   Syncope 05/18/2022   Glucosuria 05/18/2022   Chronic diastolic CHF (congestive heart failure) (HCC) 05/18/2022   Anxiety 05/18/2022   Lumbar back pain with radiculopathy affecting left lower extremity 01/20/2022   Perineal numbness 01/20/2022   History of back surgery 01/20/2022   Oral herpes 10/11/2021   Osteoarthritis of knees, bilateral 09/06/2021   Seborrheic keratoses 02/01/2021   Chest pain 02/01/2021   Anemia 11/08/2020   History of melanoma 08/10/2020   Scoliosis 06/29/2020   Hormone replacement therapy (HRT) 06/29/2020   Lymphocytic colitis 06/29/2020   Chronic foot pain 06/29/2020   Left flank discomfort 04/10/2020   H/O right nephrectomy 04/10/2020   Chronic renal insufficiency, stage III (moderate) (HCC) 10/02/2018   Gastroesophageal reflux disease 10/02/2018   Hypertension 10/02/2018   Insomnia 10/02/2018  Pacemaker 10/02/2018   Osteoporosis 10/02/2018   Sinoatrial node dysfunction (HCC) 10/02/2018   Recurrent major depressive disorder (HCC) 10/02/2018   Posttraumatic stress disorder 10/02/2018   Cobalamin deficiency 10/02/2018   Arthritis 10/02/2018   Thoracic spine pain 10/02/2018   Enthesopathy of hip region 10/02/2018   Migraine headache 11/18/2012   Left flank pain 06/19/2012    PCP: Dana Allan, MD  REFERRING PROVIDER: Hector Shade, MD  REFERRING DIAG: peroneal tendinitis of left lower extremity  Rationale for Evaluation and Treatment: Rehabilitation  THERAPY DIAG:  Pain in left ankle and joints of left foot  Pain in right ankle and joints of right foot  Neuralgia and neuritis  ONSET DATE: about 8 years ago,  surgery in 2021  PERTINENT HISTORY:  Patient is a 67 y.o. female who presents to outpatient physical therapy with a referral for medical diagnosis peroneal tendinitis of left lower extremity. This patient's chief complaints consist of bilateral foot and lower leg pain, leading to the following functional deficits: difficulty with patience and keeping up with all of her many responsibilities while dealing with constant pain, sleeping, working, standing, walking, sitting, working out, spending time with grandkids, sports, caring for husband, all aspects of life. Relevant past medical history and comorbidities include Left flank discomfort; H/O right nephrectomy; Chronic renal insufficiency, stage III (moderate) (HCC); Gastroesophageal reflux disease; Hypertension; Insomnia; Migraine headache; Pacemaker; Osteoporosis; Sinoatrial node dysfunction (HCC); Recurrent major depressive disorder (HCC); Posttraumatic stress disorder; Cobalamin deficiency; Arthritis; Thoracic spine pain; Scoliosis; Hormone replacement therapy (HRT); Lymphocytic colitis; Chronic foot pain; Enthesopathy of hip region; Left flank pain; History of melanoma (last year was the most recent - no chemo or radiation); Anemia; Seborrheic keratoses; Chest pain; Oral herpes; Lumbar back pain with radiculopathy affecting left lower extremity; Perineal numbness; History of back surgery; Syncope; Glucosuria; Chronic diastolic CHF (congestive heart failure) (HCC); Anxiety; Osteoarthritis of knees, bilateral; Low vitamin B12 level; Postmenopausal estrogen deficiency; abdominal hysterectomy, breast lumpectomy (1979), bilateral carpal tunnel release, c-section, bilateral foot surgery, bilaterally hip arthroscopy (doing well), lumbar laminectomy, R RTC repair, umbilical hernia repair, Weil Osteotomy (bilateral 11/27/2019). Patient denies hx of stroke, seizures, lung problems, diabetes, unexplained weight loss, unexplained changes in bowel or bladder problems, and  unexplained stumbling or dropping things.  SUBJECTIVE:                                                                                                                                                                                           SUBJECTIVE STATEMENT: Patient reports her pain is higher today after wearing some shoes that she hasn't worn in a long time. She states RA has  been confirmed and they want to start her on methotrexate. She is unsure about starting this medication because of concerns about the side effects.   PAIN:  Are you having pain? yes NPRS: 8/10 Pain location: B feet and lower extremities up to knees  PRECAUTIONS: osteoporosis, pacemaker   PATIENT GOALS: "to alleviate some of the pain"   OBJECTIVE   TODAY'S TREATMENT:   Therapeutic exercise: to centralize symptoms and improve ROM, strength, muscular endurance, and activity tolerance required for successful completion of functional activities.  - standing Toe splays, repeated 3 sec holds. Ball of foot and heel maintains contact with floor. Cuing for improved foot posture. 1x10 B LE - standing Toe Yoga: great toe extension with small toes flexion pressure into floor, repeated 3 sec holds; small toes extension with great toe flexion pressure into floor, repeated 3 sec holds. Ball of foot and heel maintains contact with floor. Cuing for improved foot posture. 1x10 each position B LE - standing arch lifts, 1x10 L LE.  - standing great toe flexion into floor with GTB resistance, 1x10 each side.  - seated L foot inversion and eversion with green theraband, 1x10 each direction.  - seated sciatic nerve glide, slider technique, 1x10 each side.  - pain science education with handout for bean discrimination activity.  - Education on HEP including handout   Manual therapy: to reduce pain and tissue tension, improve range of motion, neuromodulation, in order to promote improved ability to complete functional  activities. SEATED - L 2nd and 3rd MTP joint flexion and distraction mobilization/PROM with overpressure, grade III-IV - R 2nd and 3rd toe PIP extension PROM   Pt required multimodal cuing for proper technique and to facilitate improved neuromuscular control, strength, range of motion, and functional ability resulting in improved performance and form.   PATIENT EDUCATION:  Education details: Exercise purpose/form. Self management techniques. Education on diagnosis, prognosis, POC, anatomy and physiology of current condition Education on HEP including handout. Person educated: Patient Education method: Explanation, Demonstration, Tactile cues, Verbal cues, and Handouts Education comprehension: verbalized understanding, returned demonstration, and needs further education  HOME EXERCISE PROGRAM: Access Code: [email or web address] URL: [email or web address] Date: 11/27/2022 Prepared by: Norton Blizzard  Exercises - Toe Yoga - Alternating Great Toe and Lesser Toe Extension  - 1 x daily - 1 sets - 20 reps - 4 seconds hold - Toe Spreading  - 1 x daily - 1 sets - 20 reps - 4 seconds hold - Seated Figure 4 Ankle Inversion with Resistance  - 1 x daily - 3 sets - 10-30 reps - Seated Ankle Eversion with Resistance  - 1 x daily - 3 sets - 10-30 reps - Seated Toe Flexion Extension PROM  - 1 x daily - 1 sets - 20 reps - 5 seconds hold  HOME EXERCISE PROGRAM [[email or web address]] view at [email or web address]" using code: [email or web address] GREAT TOE FLEXION WITH BAND -  Repeat 10 Repetitions, Complete 3 Sets, Perform 4 Times a Week   ASSESSMENT:  CLINICAL IMPRESSION: Patient arrives reporting increased R > L pain after wearing some shoes she had not worn for a while. She had redness at base of R 5th metatarsal that correlated with the location of increased pain. Today's session focused on reviewing HEP and updating it with progressive exercises for intrinsic and extrinsic foot strengthening as well as review of neural  gliding for sciatic nerve distribution. Patient already completes exercises that glide the nerves frequently, so it was not added to HEP. Patient was particularly painful at  the L 2nd and 3rd MTP where she has prior surgery, current swelling, and may be undergoing surgery again. Patient would benefit from continued management of limiting condition by skilled physical therapist to address remaining impairments and functional limitations to work towards stated goals and return to PLOF or maximal functional independence.   From Initial PT Evaluation on 11/23/2022:  Patient is a 67 y.o. female referred to outpatient physical therapy with a medical diagnosis of peroneal tendinitis of left lower extremity who presents with signs and symptoms consistent with bilateral foot and lower leg pain with potentially multi-factorial causes. Pain description and stocking pattern of sensory loss suggests neuropathy. Patient also has evidence of widespread arthritic changes with possible rheumatoid arthritis, chronic lumbar pain with increased concordant pain at feet with CPA to mid to upper lumbar spine which suggests lumbar contribution. Patient may benefit from improving intrinsic foot muscle strength, and exploring other interventions for pain relief such as neurodynamics, load management, and desensitization techniques.  Patient presents with significant pain, ROM, joint stiffness, posture, neurodynamic, motor control, sensation, muscle performance (strength/power/endurance), and activity tolerance impairments that are limiting ability to complete her usual activities such as keeping up with all of her many responsibilities while dealing with constant pain, sleeping, working, standing, walking, sitting, working out, spending time with grandkids, sports, caring for husband, all aspects of life without difficulty. Patient will benefit from skilled physical therapy intervention to address current body structure impairments and  activity limitations to improve function and work towards goals set in current POC in order to return to prior level of function or maximal functional improvement.  OBJECTIVE IMPAIRMENTS: decreased activity tolerance, decreased coordination, decreased endurance, decreased knowledge of condition, difficulty walking, decreased ROM, decreased strength, hypomobility, impaired sensation, and pain.   ACTIVITY LIMITATIONS: sitting, standing, sleeping, and   difficulty with patience and keeping up with all of her many responsibilities while dealing with constant pain, sleeping, working, standing, walking, sitting, working out, spending time with grandkids, sports, caring for husband, all aspects of life  PARTICIPATION LIMITATIONS: meal prep, cleaning, laundry, interpersonal relationship, driving, shopping, and community activity  PERSONAL FACTORS: Past/current experiences, Time since onset of injury/illness/exacerbation, and 3+ comorbidities:   Left flank discomfort; H/O right nephrectomy; Chronic renal insufficiency, stage III (moderate) (HCC); Gastroesophageal reflux disease; Hypertension; Insomnia; Migraine headache; Pacemaker; Osteoporosis; Sinoatrial node dysfunction (HCC); Recurrent major depressive disorder (HCC); Posttraumatic stress disorder; Cobalamin deficiency; Arthritis; Thoracic spine pain; Scoliosis; Hormone replacement therapy (HRT); Lymphocytic colitis; Chronic foot pain; Enthesopathy of hip region; Left flank pain; History of melanoma (last year was the most recent - no chemo or radiation); Anemia; Seborrheic keratoses; Chest pain; Oral herpes; Lumbar back pain with radiculopathy affecting left lower extremity; Perineal numbness; History of back surgery; Syncope; Glucosuria; Chronic diastolic CHF (congestive heart failure) (HCC); Anxiety; Osteoarthritis of knees, bilateral; Low vitamin B12 level; Postmenopausal estrogen deficiency; abdominal hysterectomy, breast lumpectomy (1979), bilateral carpal  tunnel release, c-section, bilateral foot surgery, bilaterally hip arthroscopy (doing well), lumbar laminectomy, R RTC repair, umbilical hernia repair, Weil Osteotomy (bilateral 11/27/2019) are also affecting patient's functional outcome.   REHAB POTENTIAL: Fair due to nature and severity of symptoms and patient's baseline high level of fitness and care for her health.   CLINICAL DECISION MAKING: Evolving/moderate complexity  EVALUATION COMPLEXITY: Moderate   GOALS: Goals reviewed with patient? No  SHORT TERM GOALS: Target date: 12/07/2022  Patient will be independent with initial home exercise program for self-management of symptoms. Baseline: Initial HEP provided at IE (11/23/22); Goal status:  In-progress   LONG TERM GOALS: Target date: 02/15/2023  Patient will be independent with a long-term home exercise program for self-management of symptoms.  Baseline: Initial HEP provided at IE (11/23/22); Goal status: In-progress  2.  Patient will demonstrate improved FOTO by equal or greater than 10 points by visit #10 to demonstrate improvement in overall condition and self-reported functional ability.  Baseline: to be measured visit 2 as appropriate (11/23/22); Goal status: In-progress  3.  Patient will report being able to sleep through the night with 75% less limitation due to B foot/lower leg pain.  Baseline: limited to 2 hours due to foot pain (11/23/22); Goal status: In-progress  4.  Patient will demonstrate ability to perform toe yoga and splays with good motor control and strength while in short foot position in standing to demonstrate improved intrinsic foot muscle control for improve ability to complete standing and weight bearing tasks.  Baseline: 11/23/2022 (11/23/22); Goal status: In-progress  5.  Patient will complete community, work and/or recreational activities with 50% less limitation due to current condition.  Baseline: difficulty with patience and keeping up with all of  her many responsibilities while dealing with constant pain, sleeping, working, standing, walking, sitting, working out, spending time with grandkids, sports, caring for husband, all aspects of life (11/23/22); Goal status: In-progress  6.  Patient will report pain no greater than 3/10 during functional activities to demonstrate improved ability to complete daily activities with less frustration.  Baseline: up to 10+/10 (11/23/2022);  Goal status: In-progress   PLAN:  PT FREQUENCY: 1-2x/week  PT DURATION: 12 weeks  PLANNED INTERVENTIONS: Therapeutic exercises, Therapeutic activity, Neuromuscular re-education, Balance training, Gait training, Patient/Family education, Self Care, Joint mobilization, Aquatic Therapy, Dry Needling, Electrical stimulation, Spinal mobilization, Cryotherapy, Moist heat, Manual therapy, and Re-evaluation.  PLAN FOR NEXT SESSION: update HEP as appropriate, progressive intrinsic foot/LE/functional strengthening as tolerated, neurodynamics, desensitization techniques, education, manual therapy/dry needling as needed.    Cira Rue, PT, DPT 11/27/2022, 7:08 PM   [facility] [address] [place], [place] [address] P: [phone] I F: [phone]

## 2022-11-27 ENCOUNTER — Encounter: Payer: Self-pay | Admitting: Physical Therapy

## 2022-11-27 ENCOUNTER — Ambulatory Visit: Payer: Medicare Other | Admitting: Physical Therapy

## 2022-11-27 DIAGNOSIS — M792 Neuralgia and neuritis, unspecified: Secondary | ICD-10-CM

## 2022-11-27 DIAGNOSIS — M25571 Pain in right ankle and joints of right foot: Secondary | ICD-10-CM

## 2022-11-27 DIAGNOSIS — M25572 Pain in left ankle and joints of left foot: Secondary | ICD-10-CM | POA: Diagnosis not present

## 2022-11-29 ENCOUNTER — Ambulatory Visit: Payer: Medicare Other | Admitting: Physical Therapy

## 2022-11-29 ENCOUNTER — Encounter: Payer: Self-pay | Admitting: Physical Therapy

## 2022-11-29 DIAGNOSIS — M25572 Pain in left ankle and joints of left foot: Secondary | ICD-10-CM

## 2022-11-29 DIAGNOSIS — M25571 Pain in right ankle and joints of right foot: Secondary | ICD-10-CM | POA: Diagnosis not present

## 2022-11-29 DIAGNOSIS — M792 Neuralgia and neuritis, unspecified: Secondary | ICD-10-CM

## 2022-11-29 NOTE — Therapy (Signed)
OUTPATIENT PHYSICAL THERAPY TREATMENT NOTE   Patient Name: Kim Pennington MRN: [id number] DOB:24-Jan-1956, 67 y.o., female Today's Date: 11/29/2022  END OF SESSION:  PT End of Session - 11/29/22 1652     Visit Number 3    Number of Visits 13    Date for PT Re-Evaluation 02/15/23    Authorization Type [employer] MEDICARE reporting period from 11/23/2022    Progress Note Due on Visit 10    PT Start Time 1652    PT Stop Time 1730    PT Time Calculation (min) 38 min    Activity Tolerance Patient tolerated treatment well    Behavior During Therapy WFL for tasks assessed/performed              Past Medical History:  Diagnosis Date   Acquired solitary kidney    right nephrectomy   Anemia    Anxiety    Arthritis    C. difficile diarrhea    Depression    Hepatitis B    Hyperlipidemia    Hypertension    Kidney disease, chronic, stage III (GFR 30-59 ml/min) (HCC)    Kidney stones    Lymphocytic colitis    Memory loss    Osteoporosis    Pacemaker    Renal failure, chronic, stage 3 (moderate) (HCC) 2015   Left kidney    Skin cancer    Past Surgical History:  Procedure Laterality Date   ABDOMINAL HYSTERECTOMY  11/2006   ABDOMINOPLASTY     BREAST LUMPECTOMY Right 1979   CARDIAC CATHETERIZATION  04/24/2014   CARPAL TUNNEL RELEASE Bilateral 03/29/2009   CESAREAN SECTION     x4   COLONOSCOPY     FOOT SURGERY Bilateral    HIP ARTHROSCOPY Right 09/14/2016   reduction of lesser trochanter    HIP ARTHROSCOPY Left 04/25/2016   w/ femoroplasty   left thumb tendon  04/25/2016   LIPOSUCTION     LUMBAR LAMINECTOMY  11/23/2019   foraminotomy, discectomy, facectomy   NEPHRECTOMY Right 2009   PACEMAKER IMPLANT  04/24/2014   RHINOPLASTY     x 2, 1999, 2003   ROTATOR CUFF REPAIR Right    SHOULDER ARTHROSCOPY Right 12/30/2018   x2   TONSILLECTOMY  1961   UMBILICAL HERNIA REPAIR  1998   UPPER GASTROINTESTINAL ENDOSCOPY     WEIL OSTEOTOMY Bilateral 11/27/2019   Patient Active  Problem List   Diagnosis Date Noted   Arthralgia of right hand 11/19/2022   Trigger finger of right thumb 11/19/2022   Low vitamin B12 level 09/30/2022   Encounter for hepatitis C screening test for low risk patient 09/30/2022   Postmenopausal estrogen deficiency 09/30/2022   Breast cancer screening by mammogram 09/30/2022   Syncope 05/18/2022   Glucosuria 05/18/2022   Chronic diastolic CHF (congestive heart failure) (HCC) 05/18/2022   Anxiety 05/18/2022   Lumbar back pain with radiculopathy affecting left lower extremity 01/20/2022   Perineal numbness 01/20/2022   History of back surgery 01/20/2022   Oral herpes 10/11/2021   Osteoarthritis of knees, bilateral 09/06/2021   Seborrheic keratoses 02/01/2021   Chest pain 02/01/2021   Anemia 11/08/2020   History of melanoma 08/10/2020   Scoliosis 06/29/2020   Hormone replacement therapy (HRT) 06/29/2020   Lymphocytic colitis 06/29/2020   Chronic foot pain 06/29/2020   Left flank discomfort 04/10/2020   H/O right nephrectomy 04/10/2020   Chronic renal insufficiency, stage III (moderate) (HCC) 10/02/2018   Gastroesophageal reflux disease 10/02/2018   Hypertension 10/02/2018   Insomnia 10/02/2018  Pacemaker 10/02/2018   Osteoporosis 10/02/2018   Sinoatrial node dysfunction (HCC) 10/02/2018   Recurrent major depressive disorder (HCC) 10/02/2018   Posttraumatic stress disorder 10/02/2018   Cobalamin deficiency 10/02/2018   Arthritis 10/02/2018   Thoracic spine pain 10/02/2018   Enthesopathy of hip region 10/02/2018   Migraine headache 11/18/2012   Left flank pain 06/19/2012    PCP: Dana Allan, MD  REFERRING PROVIDER: Hector Shade, MD  REFERRING DIAG: peroneal tendinitis of left lower extremity  Rationale for Evaluation and Treatment: Rehabilitation  THERAPY DIAG:  Pain in left ankle and joints of left foot  Pain in right ankle and joints of right foot  Neuralgia and neuritis  ONSET DATE: about 8 years ago,  surgery in 2021  PERTINENT HISTORY:  Patient is a 67 y.o. female who presents to outpatient physical therapy with a referral for medical diagnosis peroneal tendinitis of left lower extremity. This patient's chief complaints consist of bilateral foot and lower leg pain, leading to the following functional deficits: difficulty with patience and keeping up with all of her many responsibilities while dealing with constant pain, sleeping, working, standing, walking, sitting, working out, spending time with grandkids, sports, caring for husband, all aspects of life. Relevant past medical history and comorbidities include Left flank discomfort; H/O right nephrectomy; Chronic renal insufficiency, stage III (moderate) (HCC); Gastroesophageal reflux disease; Hypertension; Insomnia; Migraine headache; Pacemaker; Osteoporosis; Sinoatrial node dysfunction (HCC); Recurrent major depressive disorder (HCC); Posttraumatic stress disorder; Cobalamin deficiency; Arthritis; Thoracic spine pain; Scoliosis; Hormone replacement therapy (HRT); Lymphocytic colitis; Chronic foot pain; Enthesopathy of hip region; Left flank pain; History of melanoma (last year was the most recent - no chemo or radiation); Anemia; Seborrheic keratoses; Chest pain; Oral herpes; Lumbar back pain with radiculopathy affecting left lower extremity; Perineal numbness; History of back surgery; Syncope; Glucosuria; Chronic diastolic CHF (congestive heart failure) (HCC); Anxiety; Osteoarthritis of knees, bilateral; Low vitamin B12 level; Postmenopausal estrogen deficiency; abdominal hysterectomy, breast lumpectomy (1979), bilateral carpal tunnel release, c-section, bilateral foot surgery, bilaterally hip arthroscopy (doing well), lumbar laminectomy, R RTC repair, umbilical hernia repair, Weil Osteotomy (bilateral 11/27/2019). Patient denies hx of stroke, seizures, lung problems, diabetes, unexplained weight loss, unexplained changes in bowel or bladder problems, and  unexplained stumbling or dropping things.  SUBJECTIVE:                                                                                                                                                                                           SUBJECTIVE STATEMENT: Patient reports her feet are sore. She states she thinks the consistency of doing her HEP is probably adding to the soreness, but  she feels she is tolerating them well enough. She does not have any questions about her HEP and is not having any trouble with them.   PAIN:  Are you having pain? yes NPRS: 6-7/10 Pain location: B feet and lower extremities up to knees  PRECAUTIONS: osteoporosis, pacemaker   PATIENT GOALS: "to alleviate some of the pain"   OBJECTIVE   TODAY'S TREATMENT:   Manual therapy: to reduce pain and tissue tension, improve range of motion, neuromodulation, in order to promote improved ability to complete functional activities. RECLINED SEATED POSITION - STM and multi joint mobilization grade III-IV and PROM over biltaeral toe and metatarsal joints, especially focused on tightness at right 2nd and 3rd MTP joints and L 2nd MTP joint and IP joints of the 2nd and third toes. While providing education about POC, HEP, and stretches/interventions to help with improved ROM.   Pt required multimodal cuing for proper technique and to facilitate improved neuromuscular control, strength, range of motion, and functional ability resulting in improved performance and form.   PATIENT EDUCATION:  Education details: Exercise purpose/form. Self management techniques. Education on diagnosis, prognosis, POC, anatomy and physiology of current condition Education on HEP including handout. Person educated: Patient Education method: Explanation, Demonstration, Tactile cues, Verbal cues, and Handouts Education comprehension: verbalized understanding, returned demonstration, and needs further education  HOME EXERCISE  PROGRAM: Access Code: [email or web address] URL: [email or web address] Date: 11/27/2022 Prepared by: Norton Blizzard  Exercises - Toe Yoga - Alternating Great Toe and Lesser Toe Extension  - 1 x daily - 1 sets - 20 reps - 4 seconds hold - Toe Spreading  - 1 x daily - 1 sets - 20 reps - 4 seconds hold - Seated Figure 4 Ankle Inversion with Resistance  - 1 x daily - 3 sets - 10-30 reps - Seated Ankle Eversion with Resistance  - 1 x daily - 3 sets - 10-30 reps - Seated Toe Flexion Extension PROM  - 1 x daily - 1 sets - 20 reps - 5 seconds hold  HOME EXERCISE PROGRAM [[email or web address]] view at [email or web address]" using code: [email or web address] GREAT TOE FLEXION WITH BAND -  Repeat 10 Repetitions, Complete 3 Sets, Perform 4 Times a Week   ASSESSMENT:  CLINICAL IMPRESSION: Patient arrives with slightly less pain than at last PT session. Today's session focused on manual therapy over bilateral forefeet and toes for pain control and to promote improved mobility in stiff joints. Patient is more comfortable working on exercises at home and plan updated to allow patient time to work on HEP and visit the clinic every 1-2 weeks for updates and any relevant manual interventions. Patient reported pain with manual therapy that resulted in her feet feeling much better by end of session, but reports this is typically temporary. Patient would benefit from continued management of limiting condition by skilled physical therapist to address remaining impairments and functional limitations to work towards stated goals and return to PLOF or maximal functional independence.   From Initial PT Evaluation on 11/23/2022:  Patient is a 67 y.o. female referred to outpatient physical therapy with a medical diagnosis of peroneal tendinitis of left lower extremity who presents with signs and symptoms consistent with bilateral foot and lower leg pain with potentially multi-factorial causes. Pain description and stocking pattern of sensory loss  suggests neuropathy. Patient also has evidence of widespread arthritic changes with possible rheumatoid arthritis, chronic lumbar pain with increased concordant pain at feet with CPA to mid to upper lumbar spine which suggests lumbar contribution. Patient  may benefit from improving intrinsic foot muscle strength, and exploring other interventions for pain relief such as neurodynamics, load management, and desensitization techniques.  Patient presents with significant pain, ROM, joint stiffness, posture, neurodynamic, motor control, sensation, muscle performance (strength/power/endurance), and activity tolerance impairments that are limiting ability to complete her usual activities such as keeping up with all of her many responsibilities while dealing with constant pain, sleeping, working, standing, walking, sitting, working out, spending time with grandkids, sports, caring for husband, all aspects of life without difficulty. Patient will benefit from skilled physical therapy intervention to address current body structure impairments and activity limitations to improve function and work towards goals set in current POC in order to return to prior level of function or maximal functional improvement.  OBJECTIVE IMPAIRMENTS: decreased activity tolerance, decreased coordination, decreased endurance, decreased knowledge of condition, difficulty walking, decreased ROM, decreased strength, hypomobility, impaired sensation, and pain.   ACTIVITY LIMITATIONS: sitting, standing, sleeping, and   difficulty with patience and keeping up with all of her many responsibilities while dealing with constant pain, sleeping, working, standing, walking, sitting, working out, spending time with grandkids, sports, caring for husband, all aspects of life  PARTICIPATION LIMITATIONS: meal prep, cleaning, laundry, interpersonal relationship, driving, shopping, and community activity  PERSONAL FACTORS: Past/current experiences, Time since  onset of injury/illness/exacerbation, and 3+ comorbidities:   Left flank discomfort; H/O right nephrectomy; Chronic renal insufficiency, stage III (moderate) (HCC); Gastroesophageal reflux disease; Hypertension; Insomnia; Migraine headache; Pacemaker; Osteoporosis; Sinoatrial node dysfunction (HCC); Recurrent major depressive disorder (HCC); Posttraumatic stress disorder; Cobalamin deficiency; Arthritis; Thoracic spine pain; Scoliosis; Hormone replacement therapy (HRT); Lymphocytic colitis; Chronic foot pain; Enthesopathy of hip region; Left flank pain; History of melanoma (last year was the most recent - no chemo or radiation); Anemia; Seborrheic keratoses; Chest pain; Oral herpes; Lumbar back pain with radiculopathy affecting left lower extremity; Perineal numbness; History of back surgery; Syncope; Glucosuria; Chronic diastolic CHF (congestive heart failure) (HCC); Anxiety; Osteoarthritis of knees, bilateral; Low vitamin B12 level; Postmenopausal estrogen deficiency; abdominal hysterectomy, breast lumpectomy (1979), bilateral carpal tunnel release, c-section, bilateral foot surgery, bilaterally hip arthroscopy (doing well), lumbar laminectomy, R RTC repair, umbilical hernia repair, Weil Osteotomy (bilateral 11/27/2019) are also affecting patient's functional outcome.   REHAB POTENTIAL: Fair due to nature and severity of symptoms and patient's baseline high level of fitness and care for her health.   CLINICAL DECISION MAKING: Evolving/moderate complexity  EVALUATION COMPLEXITY: Moderate   GOALS: Goals reviewed with patient? No  SHORT TERM GOALS: Target date: 12/07/2022  Patient will be independent with initial home exercise program for self-management of symptoms. Baseline: Initial HEP provided at IE (11/23/22); Goal status: In-progress   LONG TERM GOALS: Target date: 02/15/2023  Patient will be independent with a long-term home exercise program for self-management of symptoms.  Baseline: Initial  HEP provided at IE (11/23/22); Goal status: In-progress  2.  Patient will demonstrate improved FOTO by equal or greater than 10 points by visit #10 to demonstrate improvement in overall condition and self-reported functional ability.  Baseline: to be measured visit 2 as appropriate (11/23/22); Goal status: In-progress  3.  Patient will report being able to sleep through the night with 75% less limitation due to B foot/lower leg pain.  Baseline: limited to 2 hours due to foot pain (11/23/22); Goal status: In-progress  4.  Patient will demonstrate ability to perform toe yoga and splays with good motor control and strength while in short foot position in standing to demonstrate improved intrinsic foot muscle  control for improve ability to complete standing and weight bearing tasks.  Baseline: 11/23/2022 (11/23/22); Goal status: In-progress  5.  Patient will complete community, work and/or recreational activities with 50% less limitation due to current condition.  Baseline: difficulty with patience and keeping up with all of her many responsibilities while dealing with constant pain, sleeping, working, standing, walking, sitting, working out, spending time with grandkids, sports, caring for husband, all aspects of life (11/23/22); Goal status: In-progress  6.  Patient will report pain no greater than 3/10 during functional activities to demonstrate improved ability to complete daily activities with less frustration.  Baseline: up to 10+/10 (11/23/2022);  Goal status: In-progress   PLAN:  PT FREQUENCY: 1-2x/week  PT DURATION: 12 weeks  PLANNED INTERVENTIONS: Therapeutic exercises, Therapeutic activity, Neuromuscular re-education, Balance training, Gait training, Patient/Family education, Self Care, Joint mobilization, Aquatic Therapy, Dry Needling, Electrical stimulation, Spinal mobilization, Cryotherapy, Moist heat, Manual therapy, and Re-evaluation.  PLAN FOR NEXT SESSION: update HEP as  appropriate, progressive intrinsic foot/LE/functional strengthening as tolerated, neurodynamics, desensitization techniques, education, manual therapy/dry needling as needed.    Cira Rue, PT, DPT 11/29/2022, 6:00 PM   [facility] [address] [place], [place] [address] P: [phone] I F: [phone]

## 2022-12-04 ENCOUNTER — Encounter: Payer: Medicare Other | Admitting: Physical Therapy

## 2022-12-04 ENCOUNTER — Encounter: Payer: Medicare Other | Admitting: Family Medicine

## 2022-12-11 ENCOUNTER — Encounter: Payer: Self-pay | Admitting: Physical Therapy

## 2022-12-11 ENCOUNTER — Ambulatory Visit: Payer: Medicare Other | Attending: Orthopedic Surgery | Admitting: Physical Therapy

## 2022-12-11 DIAGNOSIS — M792 Neuralgia and neuritis, unspecified: Secondary | ICD-10-CM | POA: Diagnosis not present

## 2022-12-11 DIAGNOSIS — M25571 Pain in right ankle and joints of right foot: Secondary | ICD-10-CM | POA: Diagnosis not present

## 2022-12-11 DIAGNOSIS — M25572 Pain in left ankle and joints of left foot: Secondary | ICD-10-CM | POA: Insufficient documentation

## 2022-12-11 NOTE — Therapy (Signed)
OUTPATIENT PHYSICAL THERAPY TREATMENT NOTE / DISCHARGE SUMMARY Dates of reporting from 11/23/2022 to 12/11/2022   Patient Name: Kim Pennington MRN: [id number] DOB:02-Feb-1956, 67 y.o., female Today's Date: 12/11/2022  END OF SESSION:  PT End of Session - 12/11/22 1711     Visit Number 4    Number of Visits 13    Date for PT Re-Evaluation 02/15/23    Authorization Type [employer] MEDICARE reporting period from 11/23/2022    Progress Note Due on Visit 10    PT Start Time 1300    PT Stop Time 1340    PT Time Calculation (min) 40 min    Activity Tolerance Patient tolerated treatment well    Behavior During Therapy WFL for tasks assessed/performed               Past Medical History:  Diagnosis Date   Acquired solitary kidney    right nephrectomy   Anemia    Anxiety    Arthritis    C. difficile diarrhea    Depression    Hepatitis B    Hyperlipidemia    Hypertension    Kidney disease, chronic, stage III (GFR 30-59 ml/min) (HCC)    Kidney stones    Lymphocytic colitis    Memory loss    Osteoporosis    Pacemaker    Renal failure, chronic, stage 3 (moderate) (HCC) 2015   Left kidney    Skin cancer    Past Surgical History:  Procedure Laterality Date   ABDOMINAL HYSTERECTOMY  11/2006   ABDOMINOPLASTY     BREAST LUMPECTOMY Right 1979   CARDIAC CATHETERIZATION  04/24/2014   CARPAL TUNNEL RELEASE Bilateral 03/29/2009   CESAREAN SECTION     x4   COLONOSCOPY     FOOT SURGERY Bilateral    HIP ARTHROSCOPY Right 09/14/2016   reduction of lesser trochanter    HIP ARTHROSCOPY Left 04/25/2016   w/ femoroplasty   left thumb tendon  04/25/2016   LIPOSUCTION     LUMBAR LAMINECTOMY  11/23/2019   foraminotomy, discectomy, facectomy   NEPHRECTOMY Right 2009   PACEMAKER IMPLANT  04/24/2014   RHINOPLASTY     x 2, 1999, 2003   ROTATOR CUFF REPAIR Right    SHOULDER ARTHROSCOPY Right 12/30/2018   x2   TONSILLECTOMY  1961   UMBILICAL HERNIA REPAIR  1998   UPPER GASTROINTESTINAL  ENDOSCOPY     WEIL OSTEOTOMY Bilateral 11/27/2019   Patient Active Problem List   Diagnosis Date Noted   Arthralgia of right hand 11/19/2022   Trigger finger of right thumb 11/19/2022   Low vitamin B12 level 09/30/2022   Encounter for hepatitis C screening test for low risk patient 09/30/2022   Postmenopausal estrogen deficiency 09/30/2022   Breast cancer screening by mammogram 09/30/2022   Syncope 05/18/2022   Glucosuria 05/18/2022   Chronic diastolic CHF (congestive heart failure) (HCC) 05/18/2022   Anxiety 05/18/2022   Lumbar back pain with radiculopathy affecting left lower extremity 01/20/2022   Perineal numbness 01/20/2022   History of back surgery 01/20/2022   Oral herpes 10/11/2021   Osteoarthritis of knees, bilateral 09/06/2021   Seborrheic keratoses 02/01/2021   Chest pain 02/01/2021   Anemia 11/08/2020   History of melanoma 08/10/2020   Scoliosis 06/29/2020   Hormone replacement therapy (HRT) 06/29/2020   Lymphocytic colitis 06/29/2020   Chronic foot pain 06/29/2020   Left flank discomfort 04/10/2020   H/O right nephrectomy 04/10/2020   Chronic renal insufficiency, stage III (moderate) (HCC) 10/02/2018   Gastroesophageal  reflux disease 10/02/2018   Hypertension 10/02/2018   Insomnia 10/02/2018   Pacemaker 10/02/2018   Osteoporosis 10/02/2018   Sinoatrial node dysfunction (HCC) 10/02/2018   Recurrent major depressive disorder (HCC) 10/02/2018   Posttraumatic stress disorder 10/02/2018   Cobalamin deficiency 10/02/2018   Arthritis 10/02/2018   Thoracic spine pain 10/02/2018   Enthesopathy of hip region 10/02/2018   Migraine headache 11/18/2012   Left flank pain 06/19/2012    PCP: Dana Allan, MD  REFERRING PROVIDER: Hector Shade, MD  REFERRING DIAG: peroneal tendinitis of left lower extremity  Rationale for Evaluation and Treatment: Rehabilitation  THERAPY DIAG:  Pain in left ankle and joints of left foot  Pain in right ankle and joints of  right foot  Neuralgia and neuritis  ONSET DATE: about 8 years ago, surgery in 2021  PERTINENT HISTORY:  Patient is a 67 y.o. female who presents to outpatient physical therapy with a referral for medical diagnosis peroneal tendinitis of left lower extremity. This patient's chief complaints consist of bilateral foot and lower leg pain, leading to the following functional deficits: difficulty with patience and keeping up with all of her many responsibilities while dealing with constant pain, sleeping, working, standing, walking, sitting, working out, spending time with grandkids, sports, caring for husband, all aspects of life. Relevant past medical history and comorbidities include Left flank discomfort; H/O right nephrectomy; Chronic renal insufficiency, stage III (moderate) (HCC); Gastroesophageal reflux disease; Hypertension; Insomnia; Migraine headache; Pacemaker; Osteoporosis; Sinoatrial node dysfunction (HCC); Recurrent major depressive disorder (HCC); Posttraumatic stress disorder; Cobalamin deficiency; Arthritis; Thoracic spine pain; Scoliosis; Hormone replacement therapy (HRT); Lymphocytic colitis; Chronic foot pain; Enthesopathy of hip region; Left flank pain; History of melanoma (last year was the most recent - no chemo or radiation); Anemia; Seborrheic keratoses; Chest pain; Oral herpes; Lumbar back pain with radiculopathy affecting left lower extremity; Perineal numbness; History of back surgery; Syncope; Glucosuria; Chronic diastolic CHF (congestive heart failure) (HCC); Anxiety; Osteoarthritis of knees, bilateral; Low vitamin B12 level; Postmenopausal estrogen deficiency; abdominal hysterectomy, breast lumpectomy (1979), bilateral carpal tunnel release, c-section, bilateral foot surgery, bilaterally hip arthroscopy (doing well), lumbar laminectomy, R RTC repair, umbilical hernia repair, Weil Osteotomy (bilateral 11/27/2019). Patient denies hx of stroke, seizures, lung problems, diabetes,  unexplained weight loss, unexplained changes in bowel or bladder problems, and unexplained stumbling or dropping things.  SUBJECTIVE:                                                                                                                                                                                           SUBJECTIVE STATEMENT: Patient reports her feet are the same. She states the  manual therapy from last PT session was the best thing with the best releif for her symptoms she has found so far. It lasted a few hours. She is doing her strengthening, motor control, and stretching exercises at home and they have gotten a little easier cognitively. She states she has learned a lot and feels ready to discharge from PT today.   PAIN:  Are you having pain? yes NPRS: 6/10 Pain location: B feet and lower extremities up to knees  PRECAUTIONS: osteoporosis, pacemaker   PATIENT GOALS: "to alleviate some of the pain"   OBJECTIVE   TODAY'S TREATMENT:   Therapeutic exercise: to centralize symptoms and improve ROM, strength, muscular endurance, and activity tolerance required for successful completion of functional activities.  - standing toe splays, B LE AROM with review on how to complete - standing toe splays with half YTB loops providing resistance to abduction at great toe and 5th toe bilaterally. Patient with good understanding of how to apply bands and complete exercise. Has some difficulty with motor control.  - trial of silicone toe spacers on both feet as an option for improved toe alignment and relief at home. Patinet provided with toe spacers to take home.  - Education on HEP, toe spacers, and taping. Discharge recommendations.   Manual therapy: to reduce pain and tissue tension, improve range of motion, neuromodulation, in order to promote improved ability to complete functional activities. SEATED  - taping of left first three toes toegether to improve alignment of 3rd toe so it  does not overlap 4th toe using athletic tape. Education on how patient might utilize this at home.   RECLINED SEATED POSITION - STM and multi joint mobilization grade III-IV and PROM over biltaeral toe and metatarsal joints, especially focused on tightness at right 2nd and 3rd MTP joints and L 2nd MTP joint and IP joints of the 2nd and third toes.   Pt required multimodal cuing for proper technique and to facilitate improved neuromuscular control, strength, range of motion, and functional ability resulting in improved performance and form.   PATIENT EDUCATION:  Education details: Exercise purpose/form. Self management techniques. Education on diagnosis, prognosis, POC, anatomy and physiology of current condition Education on HEP including handout. Person educated: Patient Education method: Explanation, Demonstration, Tactile cues, Verbal cues, and Handouts Education comprehension: verbalized understanding, returned demonstration, and needs further education  HOME EXERCISE PROGRAM: Access Code: [email or web address] URL: [email or web address] Date: 11/27/2022 Prepared by: Norton Blizzard  Exercises - Toe Yoga - Alternating Great Toe and Lesser Toe Extension  - 1 x daily - 1 sets - 20 reps - 4 seconds hold - Toe Spreading  - 1 x daily - 1 sets - 20 reps - 4 seconds hold - Seated Figure 4 Ankle Inversion with Resistance  - 1 x daily - 3 sets - 10-30 reps - Seated Ankle Eversion with Resistance  - 1 x daily - 3 sets - 10-30 reps - Seated Toe Flexion Extension PROM  - 1 x daily - 1 sets - 20 reps - 5 seconds hold  HOME EXERCISE PROGRAM [[email or web address]] view at [email or web address]" using code: [email or web address] GREAT TOE FLEXION WITH BAND -  Repeat 10 Repetitions, Complete 3 Sets, Perform 4 Times a Week   ASSESSMENT:  CLINICAL IMPRESSION: Patient has attended 4 physical therapy sessions since starting current episode of care on 11/23/2022. She has demonstrated excellent participation in HEP and PT  sessions. Patient's condition is chronic and multi-factorial and patient prefers to complete HEP at home instead of come  to visits where she exercises in the clinic. Patient is now being discharged due to receiving appropriate education and HEP and feeling ready to continue independently. She has likely reached her potential for improvement with PT at this time due to the nature and severity of her condition.    OBJECTIVE IMPAIRMENTS: decreased activity tolerance, decreased coordination, decreased endurance, decreased knowledge of condition, difficulty walking, decreased ROM, decreased strength, hypomobility, impaired sensation, and pain.   ACTIVITY LIMITATIONS: sitting, standing, sleeping, and   difficulty with patience and keeping up with all of her many responsibilities while dealing with constant pain, sleeping, working, standing, walking, sitting, working out, spending time with grandkids, sports, caring for husband, all aspects of life  PARTICIPATION LIMITATIONS: meal prep, cleaning, laundry, interpersonal relationship, driving, shopping, and community activity  PERSONAL FACTORS: Past/current experiences, Time since onset of injury/illness/exacerbation, and 3+ comorbidities:   Left flank discomfort; H/O right nephrectomy; Chronic renal insufficiency, stage III (moderate) (HCC); Gastroesophageal reflux disease; Hypertension; Insomnia; Migraine headache; Pacemaker; Osteoporosis; Sinoatrial node dysfunction (HCC); Recurrent major depressive disorder (HCC); Posttraumatic stress disorder; Cobalamin deficiency; Arthritis; Thoracic spine pain; Scoliosis; Hormone replacement therapy (HRT); Lymphocytic colitis; Chronic foot pain; Enthesopathy of hip region; Left flank pain; History of melanoma (last year was the most recent - no chemo or radiation); Anemia; Seborrheic keratoses; Chest pain; Oral herpes; Lumbar back pain with radiculopathy affecting left lower extremity; Perineal numbness; History of back  surgery; Syncope; Glucosuria; Chronic diastolic CHF (congestive heart failure) (HCC); Anxiety; Osteoarthritis of knees, bilateral; Low vitamin B12 level; Postmenopausal estrogen deficiency; abdominal hysterectomy, breast lumpectomy (1979), bilateral carpal tunnel release, c-section, bilateral foot surgery, bilaterally hip arthroscopy (doing well), lumbar laminectomy, R RTC repair, umbilical hernia repair, Weil Osteotomy (bilateral 11/27/2019) are also affecting patient's functional outcome.   REHAB POTENTIAL: Fair due to nature and severity of symptoms and patient's baseline high level of fitness and care for her health.   CLINICAL DECISION MAKING: Evolving/moderate complexity  EVALUATION COMPLEXITY: Moderate   GOALS: Goals reviewed with patient? No  SHORT TERM GOALS: Target date: 12/07/2022  Patient will be independent with initial home exercise program for self-management of symptoms. Baseline: Initial HEP provided at IE (11/23/22); Goal status: MET   LONG TERM GOALS: Target date: 02/15/2023  Patient will be independent with a long-term home exercise program for self-management of symptoms.  Baseline: Initial HEP provided at IE (11/23/22); Goal status: MET  2.  Patient will demonstrate improved FOTO by equal or greater than 10 points by visit #10 to demonstrate improvement in overall condition and self-reported functional ability.  Baseline: to be measured visit 2 as appropriate (11/23/22); 75 at visit #3 (11/29/2022);  Goal status: Not Measured twice (predicted to get worse)  3.  Patient will report being able to sleep through the night with 75% less limitation due to B foot/lower leg pain.  Baseline: limited to 2 hours due to foot pain (11/23/22); Goal status: not met  4.  Patient will demonstrate ability to perform toe yoga and splays with good motor control and strength while in short foot position in standing to demonstrate improved intrinsic foot muscle control for improve ability  to complete standing and weight bearing tasks.  Baseline: to be measured visit 2 as appropriate (11/23/22); Goal status: partially met  5.  Patient will complete community, work and/or recreational activities with 50% less limitation due to current condition.  Baseline: difficulty with patience and keeping up with all of her many responsibilities while dealing with constant pain, sleeping, working, standing, walking,  sitting, working out, spending time with grandkids, sports, caring for husband, all aspects of life (11/23/22); Goal status: Not met  6.  Patient will report pain no greater than 3/10 during functional activities to demonstrate improved ability to complete daily activities with less frustration.  Baseline: up to 10+/10 (11/23/2022);  Goal status: not met   PLAN:  PT FREQUENCY: 1-2x/week  PT DURATION: 12 weeks  PLANNED INTERVENTIONS: Therapeutic exercises, Therapeutic activity, Neuromuscular re-education, Balance training, Gait training, Patient/Family education, Self Care, Joint mobilization, Aquatic Therapy, Dry Needling, Electrical stimulation, Spinal mobilization, Cryotherapy, Moist heat, Manual therapy, and Re-evaluation.  PLAN FOR NEXT SESSION: Patient is now discharged to long term HEP   Cira Rue, PT, DPT 12/11/2022, 5:23 PM   [facility] [address] [place], [place] [address] P: [phone] I F: [phone]

## 2022-12-11 NOTE — Progress Notes (Signed)
Remote pacemaker transmission.   

## 2022-12-12 ENCOUNTER — Telehealth: Payer: Self-pay | Admitting: Family Medicine

## 2022-12-12 ENCOUNTER — Encounter: Payer: Medicare Other | Admitting: Physical Therapy

## 2022-12-12 DIAGNOSIS — M25511 Pain in right shoulder: Secondary | ICD-10-CM | POA: Diagnosis not present

## 2022-12-12 DIAGNOSIS — H01009 Unspecified blepharitis unspecified eye, unspecified eyelid: Secondary | ICD-10-CM | POA: Diagnosis not present

## 2022-12-12 DIAGNOSIS — M25512 Pain in left shoulder: Secondary | ICD-10-CM | POA: Diagnosis not present

## 2022-12-12 DIAGNOSIS — H0015 Chalazion left lower eyelid: Secondary | ICD-10-CM | POA: Diagnosis not present

## 2022-12-12 DIAGNOSIS — H04123 Dry eye syndrome of bilateral lacrimal glands: Secondary | ICD-10-CM | POA: Diagnosis not present

## 2022-12-12 NOTE — Telephone Encounter (Signed)
Copied from CRM [id number]. Topic: Medicare AWV >> Dec 12, 2022 10:22 AM Payton Doughty wrote: Reason for CRM: LVM 12/12/22 AWV time changed from 11am to 13:30am. Please confirm time change khc   Verlee Rossetti; Care Guide Ambulatory Clinical Support [place] [facility] Direct Dial: [phone]

## 2022-12-14 ENCOUNTER — Encounter: Payer: Medicare Other | Admitting: Physical Therapy

## 2022-12-19 ENCOUNTER — Encounter: Payer: Medicare Other | Admitting: Physical Therapy

## 2022-12-19 DIAGNOSIS — M5416 Radiculopathy, lumbar region: Secondary | ICD-10-CM | POA: Diagnosis not present

## 2022-12-20 ENCOUNTER — Ambulatory Visit: Payer: Medicare Other | Admitting: *Deleted

## 2022-12-20 VITALS — Ht 62.0 in | Wt 120.0 lb

## 2022-12-20 DIAGNOSIS — Z0000 Encounter for general adult medical examination without abnormal findings: Secondary | ICD-10-CM

## 2022-12-20 NOTE — Patient Instructions (Signed)
Per patient no change in vitals since last visit, unable to obtain new vitals due to telehealth visit  Kim Pennington , Thank you for taking time to come for your Medicare Wellness Visit. I appreciate your ongoing commitment to your health goals. Please review the following plan we discussed and let me know if I can assist you in the future.   These are the goals we discussed:  Goals      Patient Stated     Maintain current workout routine     Patient Stated     Maintain health        This is a list of the screening recommended for you and due dates:  Health Maintenance  Topic Date Due   DTaP/Tdap/Td vaccine (1 - Tdap) Never done   Zoster (Shingles) Vaccine (1 of 2) Never done   COVID-19 Vaccine (4 - 2023-24 season) 02/03/2022   Flu Shot  01/04/2023   Mammogram  03/09/2023   Medicare Annual Wellness Visit  12/20/2023   Colon Cancer Screening  10/01/2031   Pneumonia Vaccine  Completed   DEXA scan (bone density measurement)  Completed   Hepatitis C Screening  Completed   HPV Vaccine  Aged Out    Advanced directives:On file  Conditions/risks identified: None  Next appointment: Follow up in one year for your annual wellness visit 12/24/23 @ 11:15   Preventive Care 65 Years and Older, Female Preventive care refers to lifestyle choices and visits with your health care provider that can promote health and wellness. What does preventive care include? A yearly physical exam. This is also called an annual well check. Dental exams once or twice a year. Routine eye exams. Ask your health care provider how often you should have your eyes checked. Personal lifestyle choices, including: Daily care of your teeth and gums. Regular physical activity. Eating a healthy diet. Avoiding tobacco and drug use. Limiting alcohol use. Practicing safe sex. Taking low-dose aspirin every day. Taking vitamin and mineral supplements as recommended by your health care provider. What happens during an  annual well check? The services and screenings done by your health care provider during your annual well check will depend on your age, overall health, lifestyle risk factors, and family history of disease. Counseling  Your health care provider may ask you questions about your: Alcohol use. Tobacco use. Drug use. Emotional well-being. Home and relationship well-being. Sexual activity. Eating habits. History of falls. Memory and ability to understand (cognition). Work and work Astronomer. Reproductive health. Screening  You may have the following tests or measurements: Height, weight, and BMI. Blood pressure. Lipid and cholesterol levels. These may be checked every 5 years, or more frequently if you are over 82 years old. Skin check. Lung cancer screening. You may have this screening every year starting at age 6 if you have a 30-pack-year history of smoking and currently smoke or have quit within the past 15 years. Fecal occult blood test (FOBT) of the stool. You may have this test every year starting at age 77. Flexible sigmoidoscopy or colonoscopy. You may have a sigmoidoscopy every 5 years or a colonoscopy every 10 years starting at age 16. Hepatitis C blood test. Hepatitis B blood test. Sexually transmitted disease (STD) testing. Diabetes screening. This is done by checking your blood sugar (glucose) after you have not eaten for a while (fasting). You may have this done every 1-3 years. Bone density scan. This is done to screen for osteoporosis. You may have this done starting  at age 36. Mammogram. This may be done every 1-2 years. Talk to your health care provider about how often you should have regular mammograms. Talk with your health care provider about your test results, treatment options, and if necessary, the need for more tests. Vaccines  Your health care provider may recommend certain vaccines, such as: Influenza vaccine. This is recommended every year. Tetanus,  diphtheria, and acellular pertussis (Tdap, Td) vaccine. You may need a Td booster every 10 years. Zoster vaccine. You may need this after age 10. Pneumococcal 13-valent conjugate (PCV13) vaccine. One dose is recommended after age 52. Pneumococcal polysaccharide (PPSV23) vaccine. One dose is recommended after age 55. Talk to your health care provider about which screenings and vaccines you need and how often you need them. This information is not intended to replace advice given to you by your health care provider. Make sure you discuss any questions you have with your health care provider. Document Released: 06/18/2015 Document Revised: 02/09/2016 Document Reviewed: 03/23/2015 Elsevier Interactive Patient Education  2017 [employer].  Fall Prevention in the Home Falls can cause injuries. They can happen to people of all ages. There are many things you can do to make your home safe and to help prevent falls. What can I do on the outside of my home? Regularly fix the edges of walkways and driveways and fix any cracks. Remove anything that might make you trip as you walk through a door, such as a raised step or threshold. Trim any bushes or trees on the path to your home. Use bright outdoor lighting. Clear any walking paths of anything that might make someone trip, such as rocks or tools. Regularly check to see if handrails are loose or broken. Make sure that both sides of any steps have handrails. Any raised decks and porches should have guardrails on the edges. Have any leaves, snow, or ice cleared regularly. Use sand or salt on walking paths during winter. Clean up any spills in your garage right away. This includes oil or grease spills. What can I do in the bathroom? Use night lights. Install grab bars by the toilet and in the tub and shower. Do not use towel bars as grab bars. Use non-skid mats or decals in the tub or shower. If you need to sit down in the shower, use a plastic,  non-slip stool. Keep the floor dry. Clean up any water that spills on the floor as soon as it happens. Remove soap buildup in the tub or shower regularly. Attach bath mats securely with double-sided non-slip rug tape. Do not have throw rugs and other things on the floor that can make you trip. What can I do in the bedroom? Use night lights. Make sure that you have a light by your bed that is easy to reach. Do not use any sheets or blankets that are too big for your bed. They should not hang down onto the floor. Have a firm chair that has side arms. You can use this for support while you get dressed. Do not have throw rugs and other things on the floor that can make you trip. What can I do in the kitchen? Clean up any spills right away. Avoid walking on wet floors. Keep items that you use a lot in easy-to-reach places. If you need to reach something above you, use a strong step stool that has a grab bar. Keep electrical cords out of the way. Do not use floor polish or wax that makes  floors slippery. If you must use wax, use non-skid floor wax. Do not have throw rugs and other things on the floor that can make you trip. What can I do with my stairs? Do not leave any items on the stairs. Make sure that there are handrails on both sides of the stairs and use them. Fix handrails that are broken or loose. Make sure that handrails are as long as the stairways. Check any carpeting to make sure that it is firmly attached to the stairs. Fix any carpet that is loose or worn. Avoid having throw rugs at the top or bottom of the stairs. If you do have throw rugs, attach them to the floor with carpet tape. Make sure that you have a light switch at the top of the stairs and the bottom of the stairs. If you do not have them, ask someone to add them for you. What else can I do to help prevent falls? Wear shoes that: Do not have high heels. Have rubber bottoms. Are comfortable and fit you well. Are closed  at the toe. Do not wear sandals. If you use a stepladder: Make sure that it is fully opened. Do not climb a closed stepladder. Make sure that both sides of the stepladder are locked into place. Ask someone to hold it for you, if possible. Clearly mark and make sure that you can see: Any grab bars or handrails. First and last steps. Where the edge of each step is. Use tools that help you move around (mobility aids) if they are needed. These include: Canes. Walkers. Scooters. Crutches. Turn on the lights when you go into a dark area. Replace any light bulbs as soon as they burn out. Set up your furniture so you have a clear path. Avoid moving your furniture around. If any of your floors are uneven, fix them. If there are any pets around you, be aware of where they are. Review your medicines with your doctor. Some medicines can make you feel dizzy. This can increase your chance of falling. Ask your doctor what other things that you can do to help prevent falls. This information is not intended to replace advice given to you by your health care provider. Make sure you discuss any questions you have with your health care provider. Document Released: 03/18/2009 Document Revised: 10/28/2015 Document Reviewed: 06/26/2014 Elsevier Interactive Patient Education  2017 [employer].

## 2022-12-20 NOTE — Progress Notes (Addendum)
Subjective:   Kim Pennington is a 67 y.o. female who presents for Medicare Annual (Subsequent) preventive examination.  Visit Complete: Virtual  I connected with  Aurea Graff on 12/20/22 by a audio enabled telemedicine application and verified that I am speaking with the correct person using two identifiers.  Patient Location: Home  Provider Location: Office/Clinic  I discussed the limitations of evaluation and management by telemedicine. The patient expressed understanding and agreed to proceed.    Review of Systems     Cardiac Risk Factors include: advanced age (>6men, >11 women);hypertension;Other (see comment), Risk factor comments: Pacemaker     Objective:    Today's Vitals   12/20/22 1031 12/20/22 1032  Weight: 120 lb (54.4 kg)   Height: 5\' 2"  (1.575 m)   PainSc:  8    Body mass index is 21.95 kg/m.     12/20/2022   10:46 AM 05/19/2022    2:00 AM 11/28/2021    2:44 PM 02/21/2021    4:09 PM 04/10/2020    4:00 AM  Advanced Directives  Does Patient Have a Medical Advance Directive? Yes No No Yes Yes  Type of Estate agent of [place];Living will   Healthcare Power of [place];Living will --  Does patient want to make changes to medical advance directive? No - Patient declined   No - Patient declined No - Patient declined  Copy of Healthcare Power of Attorney in Chart? Yes - validated most recent copy scanned in chart (See row information)      Would patient like information on creating a medical advance directive?  No - Patient declined Yes (MAU/Ambulatory/Procedural Areas - Information given)      Current Medications (verified) Outpatient Encounter Medications as of 12/20/2022  Medication Sig   alendronate (FOSAMAX) 70 MG tablet TAKE 1 TABLET(70 MG) BY MOUTH 1 TIME A WEEK (Patient taking differently: Monday)   ALPRAZolam (XANAX) 0.5 MG tablet Take 1 tablet (0.5 mg total) by mouth at bedtime as needed.   Calcium Carbonate-Vit D-Min (CALCIUM  1200 PO) Take 1 tablet by mouth daily.   CRANBERRY-CALCIUM PO Take 1 tablet by mouth daily.   Methotrexate 25 MG/ML SOSY Inject into the skin. Inject weekly   prazosin (MINIPRESS) 1 MG capsule Take 1 capsule (1 mg total) by mouth at bedtime.   OVER THE COUNTER MEDICATION Take 3 tablets by mouth daily. Plesus, 1 tab am and 2 in pm (Patient not taking: Reported on 12/20/2022)   valACYclovir (VALTREX) 1000 MG tablet Take 1 tablet (1,000 mg total) by mouth 2 (two) times daily.   No facility-administered encounter medications on file as of 12/20/2022.    Allergies (verified) Tape   History: Past Medical History:  Diagnosis Date   Acquired solitary kidney    right nephrectomy   Anemia    Anxiety    Arthritis    C. difficile diarrhea    Depression    Hepatitis B    Hyperlipidemia    Hypertension    Kidney disease, chronic, stage III (GFR 30-59 ml/min) (HCC)    Kidney stones    Lymphocytic colitis    Memory loss    Osteoporosis    Pacemaker    Renal failure, chronic, stage 3 (moderate) (HCC) 2015   Left kidney    Skin cancer    Past Surgical History:  Procedure Laterality Date   ABDOMINAL HYSTERECTOMY  11/2006   ABDOMINOPLASTY     BREAST LUMPECTOMY Right 1979   CARDIAC CATHETERIZATION  04/24/2014  CARPAL TUNNEL RELEASE Bilateral 03/29/2009   CESAREAN SECTION     x4   COLONOSCOPY     FOOT SURGERY Bilateral    HIP ARTHROSCOPY Right 09/14/2016   reduction of lesser trochanter    HIP ARTHROSCOPY Left 04/25/2016   w/ femoroplasty   left thumb tendon  04/25/2016   LIPOSUCTION     LUMBAR LAMINECTOMY  11/23/2019   foraminotomy, discectomy, facectomy   NEPHRECTOMY Right 2009   PACEMAKER IMPLANT  04/24/2014   RHINOPLASTY     x 2, 1999, 2003   ROTATOR CUFF REPAIR Right    SHOULDER ARTHROSCOPY Right 12/30/2018   x2   TONSILLECTOMY  1961   UMBILICAL HERNIA REPAIR  1998   UPPER GASTROINTESTINAL ENDOSCOPY     WEIL OSTEOTOMY Bilateral 11/27/2019   Family History  Problem  Relation Age of Onset   Diabetes Mother    Parkinson's disease Mother    Alzheimer's disease Mother    Heart disease Father    Alzheimer's disease Father    Hypertension Father    CVA Father    Rheum arthritis Father    Stomach cancer Sister    Hyperlipidemia Sister    Hypertension Sister    Diabetes Sister    Liver disease Sister    Drug abuse Sister    Obesity Sister    Hyperlipidemia Sister    Hypertension Sister    Diabetes Sister    Heart disease Sister    Obesity Sister    Diabetes Sister    Breast cancer Sister 30       x2   Hyperlipidemia Sister    Hypertension Sister    Colon polyps Sister    Obesity Sister    Hyperlipidemia Sister    Hypertension Sister    Obesity Sister    Hyperlipidemia Brother    Hypertension Brother    Heart disease Brother    Obesity Brother    Diabetes Maternal Aunt    Diabetes Maternal Grandmother    Heart attack Paternal Grandfather    Osteoarthritis Daughter    Obesity Son    Colon cancer Neg Hx    Esophageal cancer Neg Hx    Social History   Socioeconomic History   Marital status: Married    Spouse name: Casimiro Needle    Number of children: 4   Years of education: some college   Highest education level: Associate degree: academic program  Occupational History   Occupation: retired Patent examiner  Tobacco Use   Smoking status: Never   Smokeless tobacco: Never  Vaping Use   Vaping status: Never Used  Substance and Sexual Activity   Alcohol use: Never   Drug use: Never   Sexual activity: Not Currently    Birth control/protection: Post-menopausal, Surgical  Other Topics Concern   Not on file  Social History Narrative   06/29/20   From: [place], lived in [place] some, all her children live in [place] 2021   Living: with husband, Casimiro Needle (1990)   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      Family: 4 adult children - Mauro Kaufmann, Sariah and Rolland Bimler - 16 grandchildren (has 4 adult step children)       Enjoys: work out, spend time with family, hand work, music      Exercise: foot pain limits - typically weight lifts 2 hours a day   Diet: good, not as good since Sales executive belts:  Yes    Guns: Yes  and secure   Safe in relationships: Yes    Social Determinants of Health   Financial Resource Strain: Low Risk  (12/20/2022)   Overall Financial Resource Strain (CARDIA)    Difficulty of Paying Living Expenses: Not hard at all  Food Insecurity: No Food Insecurity (12/20/2022)   Hunger Vital Sign    Worried About Running Out of Food in the Last Year: Never true    Ran Out of Food in the Last Year: Never true  Transportation Needs: No Transportation Needs (12/20/2022)   PRAPARE - Administrator, Civil Service (Medical): No    Lack of Transportation (Non-Medical): No  Physical Activity: Sufficiently Active (12/20/2022)   Exercise Vital Sign    Days of Exercise per Week: 6 days    Minutes of Exercise per Session: 90 min  Stress: No Stress Concern Present (12/20/2022)   [employer] of Occupational Health - Occupational Stress Questionnaire    Feeling of Stress : Not at all  Social Connections: Socially Integrated (12/20/2022)   Social Connection and Isolation Panel [NHANES]    Frequency of Communication with Friends and Family: More than three times a week    Frequency of Social Gatherings with Friends and Family: More than three times a week    Attends Religious Services: More than 4 times per year    Active Member of [employer] or Organizations: Yes    Attends Engineer, structural: More than 4 times per year    Marital Status: Married    Tobacco Counseling Counseling given: Not Answered   Clinical Intake:  Pre-visit preparation completed: Yes  Pain : 0-10 Pain Score: 8  (worse in feet) Pain Type: Chronic pain Pain Location: Foot (also pain in shoulders and back) Pain Radiating Towards:  (both feet, shoulder and back) Pain Descriptors /  Indicators: Aching, Burning, Constant Pain Onset: More than a month ago Pain Frequency: Constant Pain Relieving Factors: soaks feet and massage feet  Pain Relieving Factors: soaks feet and massage feet  BMI - recorded: 21.95 Nutritional Status: BMI of 19-24  Normal Nutritional Risks: None Diabetes: No  How often do you need to have someone help you when you read instructions, pamphlets, or other written materials from your doctor or pharmacy?: 1 - Never  Interpreter Needed?: No  Information entered by :: R. Modestine Scherzinger LPN   Activities of Daily Living    12/20/2022   10:36 AM 12/16/2022   10:36 PM  In your present state of health, do you have any difficulty performing the following activities:  Hearing? 0 0  Vision? 0 0  Comment glasses   Difficulty concentrating or making decisions? 0 0  Walking or climbing stairs? 0 0  Dressing or bathing? 0 0  Doing errands, shopping? 0 0  Preparing Food and eating ? N N  Using the Toilet? N N  In the past six months, have you accidently leaked urine? N N  Do you have problems with loss of bowel control? N N  Managing your Medications? N N  Managing your Finances? N N  Housekeeping or managing your Housekeeping? N N    Patient Care Team: Dana Allan, MD as PCP - General (Family Medicine) Lanier Prude, MD as PCP - Electrophysiology (Cardiology)  Indicate any recent Medical Services you may have received from other than [facility] providers in the past year (date may be approximate).     Assessment:   This is a routine  wellness examination for Kissa.  Hearing/Vision screen Hearing Screening - Comments:: No issues Vision Screening - Comments:: glasses  Dietary issues and exercise activities discussed:     Goals Addressed             This Visit's Progress    Patient Stated       Maintain health       Depression Screen    12/20/2022   10:41 AM 11/09/2022    2:37 PM 09/21/2022   11:53 AM 11/28/2021    3:17 PM 06/29/2020    11:47 AM  PHQ 2/9 Scores  PHQ - 2 Score 0 0 0 4 1  PHQ- 9 Score 0 0 1 8 4     Fall Risk    12/20/2022   10:43 AM 12/16/2022   10:36 PM 11/09/2022    2:37 PM 09/21/2022   11:52 AM 11/28/2021    2:46 PM  Fall Risk   Falls in the past year? 0 0 0 0 0  Number falls in past yr: 0  0 0 0  Injury with Fall? 0  0 0 0  Risk for fall due to : No Fall Risks   No Fall Risks No Fall Risks  Follow up Falls prevention discussed;Falls evaluation completed   Falls evaluation completed Falls evaluation completed    MEDICARE RISK AT HOME:  Medicare Risk at Home - 12/20/22 1044     Any stairs in or around the home? Yes    If so, are there any without handrails? Yes    Home free of loose throw rugs in walkways, pet beds, electrical cords, etc? Yes    Adequate lighting in your home to reduce risk of falls? Yes    Life alert? No    Use of a cane, walker or w/c? No    Grab bars in the bathroom? Yes    Shower chair or bench in shower? Yes    Elevated toilet seat or a handicapped toilet? No              Cognitive Function:        12/20/2022   10:46 AM  6CIT Screen  What Year? 0 points  What month? 0 points  What time? 0 points  Count back from 20 0 points  Months in reverse 4 points  Repeat phrase 2 points  Total Score 6 points    Immunizations Immunization History  Administered Date(s) Administered   Influenza,inj,Quad PF,6+ Mos 02/01/2021   PFIZER(Purple Top)SARS-COV-2 Vaccination 08/13/2019, 09/03/2019, 06/14/2020   PNEUMOCOCCAL CONJUGATE-20 05/19/2022    TDAP status: Due, Education has been provided regarding the importance of this vaccine. Advised may receive this vaccine at local pharmacy or Health Dept. Aware to provide a copy of the vaccination record if obtained from local pharmacy or Health Dept. Verbalized acceptance and understanding.  Flu Vaccine status: Up to date Patient will get dates and advise  Pneumococcal vaccine status: Up to date  Covid-19 vaccine status:  Completed vaccines  Qualifies for Shingles Vaccine? Yes   Zostavax completed No   Shingrix Completed?: Yes Patient will get dates and advise  Screening Tests Health Maintenance  Topic Date Due   DTaP/Tdap/Td (1 - Tdap) Never done   Zoster Vaccines- Shingrix (1 of 2) Never done   COVID-19 Vaccine (4 - 2023-24 season) 02/03/2022   INFLUENZA VACCINE  01/04/2023   MAMMOGRAM  03/09/2023   Medicare Annual Wellness (AWV)  12/20/2023   Colonoscopy  10/01/2031   Pneumonia Vaccine 65+  Years old  Completed   DEXA SCAN  Completed   Hepatitis C Screening  Completed   HPV VACCINES  Aged Out    Health Maintenance  Health Maintenance Due  Topic Date Due   DTaP/Tdap/Td (1 - Tdap) Never done   Zoster Vaccines- Shingrix (1 of 2) Never done   COVID-19 Vaccine (4 - 2023-24 season) 02/03/2022    Colorectal cancer screening: Type of screening: Colonoscopy. Completed 4/23. Repeat every 10 years  Mammogram status: Completed 10/23. Repeat every year  Bone Density status: Completed 6/22. Results reflect: Bone density results: OSTEOPOROSIS. Repeat every 2 years.  Lung Cancer Screening: (Low Dose CT Chest recommended if Age 22-80 years, 20 pack-year currently smoking OR have quit w/in 15years.) does not qualify.     Additional Screening:  Hepatitis C Screening: does qualify; Completed 4/24  Vision Screening: Recommended annual ophthalmology exams for early detection of glaucoma and other disorders of the eye. Is the patient up to date with their annual eye exam?  Yes  Who is the provider or what is the name of the office in which the patient attends annual eye exams? [facility] If pt is not established with a provider, would they like to be referred to a provider to establish care? No .   Dental Screening: Recommended annual dental exams for proper oral hygiene   Community Resource Referral / Chronic Care Management: CRR required this visit?  No   CCM required this visit?  No      Plan:     I have personally reviewed and noted the following in the patient's chart:   Medical and social history Use of alcohol, tobacco or illicit drugs  Current medications and supplements including opioid prescriptions. Patient is not currently taking opioid prescriptions. Functional ability and status Nutritional status Physical activity Advanced directives List of other physicians Hospitalizations, surgeries, and ER visits in previous 12 months Vitals Screenings to include cognitive, depression, and falls Referrals and appointments  In addition, I have reviewed and discussed with patient certain preventive protocols, quality metrics, and best practice recommendations. A written personalized care plan for preventive services as well as general preventive health recommendations were provided to patient.     Sydell Axon, LPN   09/11/8117   After Visit Summary: (MyChart) Due to this being a telephonic visit, the after visit summary with patients personalized plan was offered to patient via MyChart   Nurse Notes: None    I have reviewed the above information and agree with above.   Duncan Dull, MD

## 2022-12-21 ENCOUNTER — Encounter: Payer: Medicare Other | Admitting: Physical Therapy

## 2022-12-25 ENCOUNTER — Encounter: Payer: Medicare Other | Admitting: Physical Therapy

## 2022-12-27 ENCOUNTER — Encounter: Payer: Medicare Other | Admitting: Physical Therapy

## 2022-12-27 DIAGNOSIS — M65311 Trigger thumb, right thumb: Secondary | ICD-10-CM | POA: Diagnosis not present

## 2022-12-27 DIAGNOSIS — M13842 Other specified arthritis, left hand: Secondary | ICD-10-CM | POA: Diagnosis not present

## 2022-12-27 DIAGNOSIS — M25341 Other instability, right hand: Secondary | ICD-10-CM | POA: Diagnosis not present

## 2023-01-02 ENCOUNTER — Encounter: Payer: Medicare Other | Admitting: Physical Therapy

## 2023-01-03 DIAGNOSIS — M25511 Pain in right shoulder: Secondary | ICD-10-CM | POA: Diagnosis not present

## 2023-01-04 ENCOUNTER — Encounter: Payer: Medicare Other | Admitting: Physical Therapy

## 2023-01-08 ENCOUNTER — Encounter: Payer: Medicare Other | Admitting: Physical Therapy

## 2023-01-08 ENCOUNTER — Other Ambulatory Visit: Payer: Self-pay | Admitting: Family Medicine

## 2023-01-08 DIAGNOSIS — Z7960 Long term (current) use of unspecified immunomodulators and immunosuppressants: Secondary | ICD-10-CM | POA: Diagnosis not present

## 2023-01-08 DIAGNOSIS — M0609 Rheumatoid arthritis without rheumatoid factor, multiple sites: Secondary | ICD-10-CM | POA: Diagnosis not present

## 2023-01-08 DIAGNOSIS — F5104 Psychophysiologic insomnia: Secondary | ICD-10-CM

## 2023-01-09 ENCOUNTER — Other Ambulatory Visit: Payer: Self-pay

## 2023-01-09 MED ORDER — ALENDRONATE SODIUM 70 MG PO TABS: 70.0000 mg | ORAL_TABLET | ORAL | 3 refills | Status: DC

## 2023-01-09 NOTE — Telephone Encounter (Signed)
LOV 11/09/2022 NOV 03/26/2023

## 2023-01-10 ENCOUNTER — Encounter: Payer: Medicare Other | Admitting: Physical Therapy

## 2023-01-18 DIAGNOSIS — M0550 Rheumatoid polyneuropathy with rheumatoid arthritis of unspecified site: Secondary | ICD-10-CM | POA: Diagnosis not present

## 2023-01-19 DIAGNOSIS — K080 Exfoliation of teeth due to systemic causes: Secondary | ICD-10-CM | POA: Diagnosis not present

## 2023-01-23 DIAGNOSIS — H5203 Hypermetropia, bilateral: Secondary | ICD-10-CM | POA: Diagnosis not present

## 2023-01-23 DIAGNOSIS — H524 Presbyopia: Secondary | ICD-10-CM | POA: Diagnosis not present

## 2023-01-23 DIAGNOSIS — H04123 Dry eye syndrome of bilateral lacrimal glands: Secondary | ICD-10-CM | POA: Diagnosis not present

## 2023-01-23 DIAGNOSIS — H2513 Age-related nuclear cataract, bilateral: Secondary | ICD-10-CM | POA: Diagnosis not present

## 2023-02-06 ENCOUNTER — Ambulatory Visit: Payer: Medicare Other | Admitting: Internal Medicine

## 2023-02-06 ENCOUNTER — Encounter: Payer: Self-pay | Admitting: Internal Medicine

## 2023-02-06 ENCOUNTER — Telehealth: Payer: Self-pay

## 2023-02-06 ENCOUNTER — Ambulatory Visit (INDEPENDENT_AMBULATORY_CARE_PROVIDER_SITE_OTHER): Payer: Medicare Other | Admitting: Emergency Medicine

## 2023-02-06 ENCOUNTER — Encounter: Payer: Self-pay | Admitting: Emergency Medicine

## 2023-02-06 VITALS — BP 132/74 | HR 70 | Temp 98.4°F | Resp 16 | Ht 62.0 in | Wt 129.2 lb

## 2023-02-06 VITALS — BP 130/80 | HR 73 | Temp 97.4°F | Ht 62.0 in | Wt 128.0 lb

## 2023-02-06 DIAGNOSIS — J709 Respiratory conditions due to unspecified external agent: Secondary | ICD-10-CM | POA: Diagnosis not present

## 2023-02-06 NOTE — Patient Instructions (Signed)
Need follow-up with pulmonary or ENT doctor.  Referral placed today.  Health Maintenance After Age 66 After age 39, you are at a higher risk for certain long-term diseases and infections as well as injuries from falls. Falls are a major cause of broken bones and head injuries in people who are older than age 41. Getting regular preventive care can help to keep you healthy and well. Preventive care includes getting regular testing and making lifestyle changes as recommended by your health care provider. Talk with your health care provider about: Which screenings and tests you should have. A screening is a test that checks for a disease when you have no symptoms. A diet and exercise plan that is right for you. What should I know about screenings and tests to prevent falls? Screening and testing are the best ways to find a health problem early. Early diagnosis and treatment give you the best chance of managing medical conditions that are common after age 38. Certain conditions and lifestyle choices may make you more likely to have a fall. Your health care provider may recommend: Regular vision checks. Poor vision and conditions such as cataracts can make you more likely to have a fall. If you wear glasses, make sure to get your prescription updated if your vision changes. Medicine review. Work with your health care provider to regularly review all of the medicines you are taking, including over-the-counter medicines. Ask your health care provider about any side effects that may make you more likely to have a fall. Tell your health care provider if any medicines that you take make you feel dizzy or sleepy. Strength and balance checks. Your health care provider may recommend certain tests to check your strength and balance while standing, walking, or changing positions. Foot health exam. Foot pain and numbness, as well as not wearing proper footwear, can make you more likely to have a fall. Screenings,  including: Osteoporosis screening. Osteoporosis is a condition that causes the bones to get weaker and break more easily. Blood pressure screening. Blood pressure changes and medicines to control blood pressure can make you feel dizzy. Depression screening. You may be more likely to have a fall if you have a fear of falling, feel depressed, or feel unable to do activities that you used to do. Alcohol use screening. Using too much alcohol can affect your balance and may make you more likely to have a fall. Follow these instructions at home: Lifestyle Do not drink alcohol if: Your health care provider tells you not to drink. If you drink alcohol: Limit how much you have to: 0-1 drink a day for women. 0-2 drinks a day for men. Know how much alcohol is in your drink. In the [place] one drink equals one 12 oz bottle of beer (355 mL), one 5 oz glass of wine (148 mL), or one 1 oz glass of hard liquor (44 mL). Do not use any products that contain nicotine or tobacco. These products include cigarettes, chewing tobacco, and vaping devices, such as e-cigarettes. If you need help quitting, ask your health care provider. Activity  Follow a regular exercise program to stay fit. This will help you maintain your balance. Ask your health care provider what types of exercise are appropriate for you. If you need a cane or walker, use it as recommended by your health care provider. Wear supportive shoes that have nonskid soles. Safety  Remove any tripping hazards, such as rugs, cords, and clutter. Install safety equipment such as grab  bars in bathrooms and safety rails on stairs. Keep rooms and walkways well-lit. General instructions Talk with your health care provider about your risks for falling. Tell your health care provider if: You fall. Be sure to tell your health care provider about all falls, even ones that seem minor. You feel dizzy, tiredness (fatigue), or off-balance. Take over-the-counter and  prescription medicines only as told by your health care provider. These include supplements. Eat a healthy diet and maintain a healthy weight. A healthy diet includes low-fat dairy products, low-fat (lean) meats, and fiber from whole grains, beans, and lots of fruits and vegetables. Stay current with your vaccines. Schedule regular health, dental, and eye exams. Summary Having a healthy lifestyle and getting preventive care can help to protect your health and wellness after age 75. Screening and testing are the best way to find a health problem early and help you avoid having a fall. Early diagnosis and treatment give you the best chance for managing medical conditions that are more common for people who are older than age 22. Falls are a major cause of broken bones and head injuries in people who are older than age 78. Take precautions to prevent a fall at home. Work with your health care provider to learn what changes you can make to improve your health and wellness and to prevent falls. This information is not intended to replace advice given to you by your health care provider. Make sure you discuss any questions you have with your health care provider. Document Revised: 10/11/2020 Document Reviewed: 10/11/2020 Elsevier Patient Education  2024 [employer].

## 2023-02-06 NOTE — Progress Notes (Signed)
02/05/26- 61 yoF never smoker new patient for inhalation injury due to particulate matter, courtesy of Dr.  Alvy Bimler. Medical problem list includes SA Node dysfunction/Pacemaker, Migraine, Hypertension, dCHF, Oral Herpes, Lymphocytic Colitis, GERD, Lumbar Back Pain, Scoliosis, Osteoporosis, CKD3, Depression, PTSD, Insomnia, Rheumatoid Arthritis, Patient was Patient was brushing long-haired cat on 8/30 and says she inhaled a cat hair. She believes it is still in her upper airway because she says can feel it. It has not caused evident dyspnea, cough or hoarseness. The sensation has not resolved with swallowing, lozenges or other home remedies. She had in mind that we might do bronchoscopy. I discussed with Dr Celine Mans who agrees with me that our group would not offer bronchoscopy with anesthesia for a cat hair.  This procedure would be more traumatic than the cat hair. If foreign body sensation persists over next couple of weeks, can reconsider. Very unlikely that we could see a cat hair during standard video bronchoscopy, and more likely that patient would eventually cough it out if still present.  I considered giving a nebulizer treatment but she declined. Patient accepted this advice and left. CTaPE 05/18/22-  IMPRESSION: 1. No pulmonary embolus or acute aortic syndrome. 2. Left hydronephrosis is unchanged since 04/08/2020.

## 2023-02-06 NOTE — Assessment & Plan Note (Signed)
Clinically stable.  Stable vital signs. In no respiratory distress.  Oxygenating well. Differential diagnosis discussed. Patient seems to think it is located in the trachea and she points to anterior location.  Does not feel like it is in the esophagus. Nonemergent/urgent presentation. Recommend pulmonary/ENT evaluation.  Referral placed today.

## 2023-02-06 NOTE — Progress Notes (Signed)
Kim Pennington 67 y.o.   Chief Complaint  Patient presents with   Swallowed Foreign Body    Pt inhaled cat whisper. Happened Friday night about 10 pm.    HISTORY OF PRESENT ILLNESS: This is a 67 y.o. female states that she inhaled long cat whisker last Friday night and has been lodged in her trachea since.  Feeling worse and not better. Able to swallow well.  Does not think she swallowed it.  Tried "food tricks" in case it was stuck in the esophagus but no luck. Denies difficulty breathing. No other associated symptoms. No other complaints or medical concerns today.  HPI   Prior to Admission medications   Medication Sig Start Date End Date Taking? Authorizing Provider  alendronate (FOSAMAX) 70 MG tablet Take 1 tablet (70 mg total) by mouth once a week. 01/09/23  Yes Dana Allan, MD  ALPRAZolam Prudy Feeler) 0.5 MG tablet TAKE 1 TABLET(0.5 MG) BY MOUTH AT BEDTIME AS NEEDED 01/08/23  Yes Dana Allan, MD  Calcium Carbonate-Vit D-Min (CALCIUM 1200 PO) Take 1 tablet by mouth daily.   Yes [provider]  CRANBERRY-CALCIUM PO Take 1 tablet by mouth daily.   Yes [provider]  Methotrexate 25 MG/ML SOSY Inject into the skin. Inject weekly   Yes [provider]  OVER THE COUNTER MEDICATION Take 3 tablets by mouth daily. Plesus, 1 tab am and 2 in pm   Yes [provider]  prazosin (MINIPRESS) 1 MG capsule Take 1 capsule (1 mg total) by mouth at bedtime. 08/29/22  Yes Bethanie Dicker, NP    No Active Allergies  Patient Active Problem List   Diagnosis Date Noted   Arthralgia of right hand 11/19/2022   Trigger finger of right thumb 11/19/2022   Low vitamin B12 level 09/30/2022   Encounter for hepatitis C screening test for low risk patient 09/30/2022   Postmenopausal estrogen deficiency 09/30/2022   Breast cancer screening by mammogram 09/30/2022   Syncope 05/18/2022   Glucosuria 05/18/2022   Chronic diastolic CHF (congestive heart failure) (HCC) 05/18/2022    Anxiety 05/18/2022   Lumbar back pain with radiculopathy affecting left lower extremity 01/20/2022   Perineal numbness 01/20/2022   History of back surgery 01/20/2022   Oral herpes 10/11/2021   Osteoarthritis of knees, bilateral 09/06/2021   Seborrheic keratoses 02/01/2021   Chest pain 02/01/2021   Anemia 11/08/2020   History of melanoma 08/10/2020   Scoliosis 06/29/2020   Hormone replacement therapy (HRT) 06/29/2020   Lymphocytic colitis 06/29/2020   Chronic foot pain 06/29/2020   Left flank discomfort 04/10/2020   H/O right nephrectomy 04/10/2020   Chronic renal insufficiency, stage III (moderate) (HCC) 10/02/2018   Gastroesophageal reflux disease 10/02/2018   Hypertension 10/02/2018   Insomnia 10/02/2018   Pacemaker 10/02/2018   Osteoporosis 10/02/2018   Sinoatrial node dysfunction (HCC) 10/02/2018   Recurrent major depressive disorder (HCC) 10/02/2018   Posttraumatic stress disorder 10/02/2018   Cobalamin deficiency 10/02/2018   Arthritis 10/02/2018   Thoracic spine pain 10/02/2018   Enthesopathy of hip region 10/02/2018   Migraine headache 11/18/2012   Left flank pain 06/19/2012    Past Medical History:  Diagnosis Date   Acquired solitary kidney    right nephrectomy   Anemia    Anxiety    Arthritis    C. difficile diarrhea    Depression    Hepatitis B    Hyperlipidemia    Hypertension    Kidney disease, chronic, stage III (GFR 30-59 ml/min) (HCC)  Kidney stones    Lymphocytic colitis    Memory loss    Osteoporosis    Pacemaker    Renal failure, chronic, stage 3 (moderate) (HCC) 2015   Left kidney    Skin cancer     Past Surgical History:  Procedure Laterality Date   ABDOMINAL HYSTERECTOMY  11/2006   ABDOMINOPLASTY     BREAST LUMPECTOMY Right 1979   CARDIAC CATHETERIZATION  04/24/2014   CARPAL TUNNEL RELEASE Bilateral 03/29/2009   CESAREAN SECTION     x4   COLONOSCOPY     FOOT SURGERY Bilateral    HIP ARTHROSCOPY Right 09/14/2016   reduction  of lesser trochanter    HIP ARTHROSCOPY Left 04/25/2016   w/ femoroplasty   left thumb tendon  04/25/2016   LIPOSUCTION     LUMBAR LAMINECTOMY  11/23/2019   foraminotomy, discectomy, facectomy   NEPHRECTOMY Right 2009   PACEMAKER IMPLANT  04/24/2014   RHINOPLASTY     x 2, 1999, 2003   ROTATOR CUFF REPAIR Right    SHOULDER ARTHROSCOPY Right 12/30/2018   x2   TONSILLECTOMY  1961   UMBILICAL HERNIA REPAIR  1998   UPPER GASTROINTESTINAL ENDOSCOPY     WEIL OSTEOTOMY Bilateral 11/27/2019    Social History   Socioeconomic History   Marital status: Married    Spouse name: Casimiro Needle    Number of children: 4   Years of education: some college   Highest education level: Associate degree: academic program  Occupational History   Occupation: retired Patent examiner  Tobacco Use   Smoking status: Never   Smokeless tobacco: Never  Vaping Use   Vaping status: Never Used  Substance and Sexual Activity   Alcohol use: Never   Drug use: Never   Sexual activity: Not Currently    Birth control/protection: Post-menopausal, Surgical  Other Topics Concern   Not on file  Social History Narrative   06/29/20   From: [place], lived in [place] some, all her children live in [place] 2021   Living: with husband, Casimiro Needle [phone])   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      Family: 4 adult children - Earna Coder, Sheldon, Jaci Carrel and Rolland Bimler - 16 grandchildren (has 4 adult step children)      Enjoys: work out, spend time with family, hand work, music      Exercise: foot pain limits - typically weight lifts 2 hours a day   Diet: good, not as good since Sales executive belts: Yes    Guns: Yes  and secure   Safe in relationships: Yes    Social Determinants of Health   Financial Resource Strain: Low Risk  (12/20/2022)   Overall Financial Resource Strain (CARDIA)    Difficulty of Paying Living Expenses: Not hard at all  Food Insecurity: No Food Insecurity (12/20/2022)    Hunger Vital Sign    Worried About Running Out of Food in the Last Year: Never true    Ran Out of Food in the Last Year: Never true  Transportation Needs: No Transportation Needs (12/20/2022)   PRAPARE - Administrator, Civil Service (Medical): No    Lack of Transportation (Non-Medical): No  Physical Activity: Sufficiently Active (12/20/2022)   Exercise Vital Sign    Days of Exercise per Week: 6 days    Minutes of Exercise per Session: 90 min  Stress: No Stress Concern Present (12/20/2022)   [employer]  of Occupational Health - Occupational Stress Questionnaire    Feeling of Stress : Not at all  Social Connections: Socially Integrated (12/20/2022)   Social Connection and Isolation Panel [NHANES]    Frequency of Communication with Friends and Family: More than three times a week    Frequency of Social Gatherings with Friends and Family: More than three times a week    Attends Religious Services: More than 4 times per year    Active Member of [employer] or Organizations: Yes    Attends Engineer, structural: More than 4 times per year    Marital Status: Married  Catering manager Violence: Not At Risk (12/20/2022)   Humiliation, Afraid, Rape, and Kick questionnaire    Fear of Current or Ex-Partner: No    Emotionally Abused: No    Physically Abused: No    Sexually Abused: No    Family History  Problem Relation Age of Onset   Diabetes Mother    Parkinson's disease Mother    Alzheimer's disease Mother    Heart disease Father    Alzheimer's disease Father    Hypertension Father    CVA Father    Rheum arthritis Father    Stomach cancer Sister    Hyperlipidemia Sister    Hypertension Sister    Diabetes Sister    Liver disease Sister    Drug abuse Sister    Obesity Sister    Hyperlipidemia Sister    Hypertension Sister    Diabetes Sister    Heart disease Sister    Obesity Sister    Diabetes Sister    Breast cancer Sister 30       x2   Hyperlipidemia  Sister    Hypertension Sister    Colon polyps Sister    Obesity Sister    Hyperlipidemia Sister    Hypertension Sister    Obesity Sister    Hyperlipidemia Brother    Hypertension Brother    Heart disease Brother    Obesity Brother    Diabetes Maternal Aunt    Diabetes Maternal Grandmother    Heart attack Paternal Grandfather    Osteoarthritis Daughter    Obesity Son    Colon cancer Neg Hx    Esophageal cancer Neg Hx      Review of Systems  Constitutional: Negative.  Negative for chills and fever.  HENT:  Negative for congestion and sore throat.   Respiratory: Negative.  Negative for cough and shortness of breath.   Cardiovascular: Negative.  Negative for chest pain and palpitations.  Gastrointestinal:  Negative for abdominal pain, diarrhea, nausea and vomiting.  Skin: Negative.  Negative for rash.  Neurological: Negative.  Negative for dizziness and headaches.  All other systems reviewed and are negative.   Vitals:   02/06/23 1313  BP: 132/74  Pulse: 70  Resp: 16  Temp: 98.4 F (36.9 C)  SpO2: 97%    Physical Exam Vitals reviewed.  Constitutional:      Appearance: Normal appearance.  HENT:     Head: Normocephalic.     Mouth/Throat:     Mouth: Mucous membranes are moist.     Pharynx: Oropharynx is clear.  Eyes:     Extraocular Movements: Extraocular movements intact.     Pupils: Pupils are equal, round, and reactive to light.  Cardiovascular:     Rate and Rhythm: Normal rate and regular rhythm.     Pulses: Normal pulses.     Heart sounds: Normal heart sounds.  Pulmonary:  Effort: Pulmonary effort is normal.     Breath sounds: Normal breath sounds.  Musculoskeletal:     Cervical back: No tenderness.  Lymphadenopathy:     Cervical: No cervical adenopathy.  Skin:    General: Skin is warm and dry.  Neurological:     Mental Status: She is alert and oriented to person, place, and time.  Psychiatric:        Mood and Affect: Mood normal.         Behavior: Behavior normal.      ASSESSMENT & PLAN: A total of 32 minutes was spent with the patient and counseling/coordination of care regarding preparing for this visit, review of most recent office visit notes, review of chronic medical conditions under management, review of all medications, diagnosis of inhaled organic foreign body material and prognosis, need for pulmonary/ENT evaluation, ED precautions, prognosis, documentation and need for follow-up.  Problem List Items Addressed This Visit       Respiratory   Inhalation injury due to particulate matter (HCC) - Primary    Clinically stable.  Stable vital signs. In no respiratory distress.  Oxygenating well. Differential diagnosis discussed. Patient seems to think it is located in the trachea and she points to anterior location.  Does not feel like it is in the esophagus. Nonemergent/urgent presentation. Recommend pulmonary/ENT evaluation.  Referral placed today.      Relevant Orders   Ambulatory referral to Pulmonology   Patient Instructions  Need follow-up with pulmonary or ENT doctor.  Referral placed today.  Health Maintenance After Age 48 After age 36, you are at a higher risk for certain long-term diseases and infections as well as injuries from falls. Falls are a major cause of broken bones and head injuries in people who are older than age 69. Getting regular preventive care can help to keep you healthy and well. Preventive care includes getting regular testing and making lifestyle changes as recommended by your health care provider. Talk with your health care provider about: Which screenings and tests you should have. A screening is a test that checks for a disease when you have no symptoms. A diet and exercise plan that is right for you. What should I know about screenings and tests to prevent falls? Screening and testing are the best ways to find a health problem early. Early diagnosis and treatment give you the best  chance of managing medical conditions that are common after age 51. Certain conditions and lifestyle choices may make you more likely to have a fall. Your health care provider may recommend: Regular vision checks. Poor vision and conditions such as cataracts can make you more likely to have a fall. If you wear glasses, make sure to get your prescription updated if your vision changes. Medicine review. Work with your health care provider to regularly review all of the medicines you are taking, including over-the-counter medicines. Ask your health care provider about any side effects that may make you more likely to have a fall. Tell your health care provider if any medicines that you take make you feel dizzy or sleepy. Strength and balance checks. Your health care provider may recommend certain tests to check your strength and balance while standing, walking, or changing positions. Foot health exam. Foot pain and numbness, as well as not wearing proper footwear, can make you more likely to have a fall. Screenings, including: Osteoporosis screening. Osteoporosis is a condition that causes the bones to get weaker and break more easily. Blood pressure screening.  Blood pressure changes and medicines to control blood pressure can make you feel dizzy. Depression screening. You may be more likely to have a fall if you have a fear of falling, feel depressed, or feel unable to do activities that you used to do. Alcohol use screening. Using too much alcohol can affect your balance and may make you more likely to have a fall. Follow these instructions at home: Lifestyle Do not drink alcohol if: Your health care provider tells you not to drink. If you drink alcohol: Limit how much you have to: 0-1 drink a day for women. 0-2 drinks a day for men. Know how much alcohol is in your drink. In the [place] one drink equals one 12 oz bottle of beer (355 mL), one 5 oz glass of wine (148 mL), or one 1 oz glass of hard  liquor (44 mL). Do not use any products that contain nicotine or tobacco. These products include cigarettes, chewing tobacco, and vaping devices, such as e-cigarettes. If you need help quitting, ask your health care provider. Activity  Follow a regular exercise program to stay fit. This will help you maintain your balance. Ask your health care provider what types of exercise are appropriate for you. If you need a cane or walker, use it as recommended by your health care provider. Wear supportive shoes that have nonskid soles. Safety  Remove any tripping hazards, such as rugs, cords, and clutter. Install safety equipment such as grab bars in bathrooms and safety rails on stairs. Keep rooms and walkways well-lit. General instructions Talk with your health care provider about your risks for falling. Tell your health care provider if: You fall. Be sure to tell your health care provider about all falls, even ones that seem minor. You feel dizzy, tiredness (fatigue), or off-balance. Take over-the-counter and prescription medicines only as told by your health care provider. These include supplements. Eat a healthy diet and maintain a healthy weight. A healthy diet includes low-fat dairy products, low-fat (lean) meats, and fiber from whole grains, beans, and lots of fruits and vegetables. Stay current with your vaccines. Schedule regular health, dental, and eye exams. Summary Having a healthy lifestyle and getting preventive care can help to protect your health and wellness after age 36. Screening and testing are the best way to find a health problem early and help you avoid having a fall. Early diagnosis and treatment give you the best chance for managing medical conditions that are more common for people who are older than age 38. Falls are a major cause of broken bones and head injuries in people who are older than age 25. Take precautions to prevent a fall at home. Work with your health care  provider to learn what changes you can make to improve your health and wellness and to prevent falls. This information is not intended to replace advice given to you by your health care provider. Make sure you discuss any questions you have with your health care provider. Document Revised: 10/11/2020 Document Reviewed: 10/11/2020 Elsevier Patient Education  2024 [employer]    Edwina Barth, MD [facility]

## 2023-02-07 NOTE — Assessment & Plan Note (Signed)
She thinks that she might have inhaled a cat hair 4 days ago. We did not feel an invasive procedure-bronchoscopy-would be helpful.  See discussion. Plan-return as needed

## 2023-02-09 ENCOUNTER — Telehealth: Payer: Self-pay | Admitting: Family Medicine

## 2023-02-09 DIAGNOSIS — R0989 Other specified symptoms and signs involving the circulatory and respiratory systems: Secondary | ICD-10-CM | POA: Diagnosis not present

## 2023-02-09 NOTE — Telephone Encounter (Signed)
Patient called and would like a referral to pulmonary in [place].

## 2023-02-09 NOTE — Telephone Encounter (Signed)
Left message to return call to our office.  

## 2023-02-12 ENCOUNTER — Telehealth: Payer: Self-pay | Admitting: Family Medicine

## 2023-02-12 NOTE — Telephone Encounter (Signed)
Patient just called and said her referral for a pulmonary can be cancelled. She said she doesn't need it anymore.

## 2023-02-12 NOTE — Telephone Encounter (Signed)
Noted  

## 2023-02-13 LAB — CUP PACEART REMOTE DEVICE CHECK
Battery Remaining Longevity: 35 mo
Battery Remaining Percentage: 29 %
Battery Voltage: 2.93 V
Brady Statistic AP VP Percent: 1 %
Brady Statistic AP VS Percent: 15 %
Brady Statistic AS VP Percent: 1 %
Brady Statistic AS VS Percent: 85 %
Brady Statistic RA Percent Paced: 15 %
Brady Statistic RV Percent Paced: 1 %
Date Time Interrogation Session: 20240910035459
Implantable Lead Connection Status: 753985
Implantable Lead Connection Status: 753985
Implantable Lead Implant Date: 20151120
Implantable Lead Implant Date: 20151120
Implantable Lead Location: 753862
Implantable Lead Location: 753862
Implantable Pulse Generator Implant Date: 20151120
Lead Channel Impedance Value: 410 Ohm
Lead Channel Impedance Value: 560 Ohm
Lead Channel Pacing Threshold Amplitude: 0.75 V
Lead Channel Pacing Threshold Amplitude: 0.75 V
Lead Channel Pacing Threshold Pulse Width: 0.5 ms
Lead Channel Pacing Threshold Pulse Width: 0.5 ms
Lead Channel Sensing Intrinsic Amplitude: 2.3 mV
Lead Channel Sensing Intrinsic Amplitude: 6.3 mV
Lead Channel Setting Pacing Amplitude: 2 V
Lead Channel Setting Pacing Amplitude: 2 V
Lead Channel Setting Pacing Pulse Width: 0.5 ms
Lead Channel Setting Sensing Sensitivity: 0.5 mV
Pulse Gen Model: 2240
Pulse Gen Serial Number: 7689719

## 2023-02-14 ENCOUNTER — Ambulatory Visit (INDEPENDENT_AMBULATORY_CARE_PROVIDER_SITE_OTHER): Payer: Medicare Other

## 2023-02-14 DIAGNOSIS — I495 Sick sinus syndrome: Secondary | ICD-10-CM

## 2023-03-01 ENCOUNTER — Other Ambulatory Visit: Payer: Self-pay | Admitting: Nurse Practitioner

## 2023-03-01 DIAGNOSIS — F5104 Psychophysiologic insomnia: Secondary | ICD-10-CM

## 2023-03-01 NOTE — Progress Notes (Signed)
Remote pacemaker transmission.   

## 2023-03-12 ENCOUNTER — Other Ambulatory Visit: Payer: Self-pay

## 2023-03-12 ENCOUNTER — Ambulatory Visit: Payer: Self-pay | Admitting: Urology

## 2023-03-12 ENCOUNTER — Encounter: Payer: Self-pay | Admitting: Cardiology

## 2023-03-12 ENCOUNTER — Encounter (HOSPITAL_COMMUNITY)
Admission: RE | Disposition: A | Discharge status: Home / Self Care | Payer: Self-pay | Source: Ambulatory Visit | Attending: Urology

## 2023-03-12 ENCOUNTER — Ambulatory Visit (HOSPITAL_COMMUNITY)
Admission: RE | Admit: 2023-03-12 | Discharge: 2023-03-12 | Disposition: A | Discharge status: Home / Self Care | Payer: Medicare Other | Source: Ambulatory Visit | Attending: Urology | Admitting: Urology

## 2023-03-12 ENCOUNTER — Ambulatory Visit (HOSPITAL_BASED_OUTPATIENT_CLINIC_OR_DEPARTMENT_OTHER): Payer: Medicare Other | Admitting: Anesthesiology

## 2023-03-12 ENCOUNTER — Other Ambulatory Visit: Payer: Medicare Other

## 2023-03-12 ENCOUNTER — Encounter (HOSPITAL_COMMUNITY): Payer: Self-pay | Admitting: Urology

## 2023-03-12 ENCOUNTER — Telehealth: Payer: Self-pay

## 2023-03-12 ENCOUNTER — Ambulatory Visit (HOSPITAL_COMMUNITY): Payer: Medicare Other | Admitting: Anesthesiology

## 2023-03-12 ENCOUNTER — Ambulatory Visit (HOSPITAL_COMMUNITY): Payer: Medicare Other

## 2023-03-12 DIAGNOSIS — Z905 Acquired absence of kidney: Secondary | ICD-10-CM | POA: Insufficient documentation

## 2023-03-12 DIAGNOSIS — M069 Rheumatoid arthritis, unspecified: Secondary | ICD-10-CM | POA: Diagnosis not present

## 2023-03-12 DIAGNOSIS — Z950 Presence of cardiac pacemaker: Secondary | ICD-10-CM | POA: Diagnosis not present

## 2023-03-12 DIAGNOSIS — R109 Unspecified abdominal pain: Secondary | ICD-10-CM | POA: Diagnosis not present

## 2023-03-12 DIAGNOSIS — N200 Calculus of kidney: Secondary | ICD-10-CM

## 2023-03-12 DIAGNOSIS — N183 Chronic kidney disease, stage 3 unspecified: Secondary | ICD-10-CM | POA: Diagnosis not present

## 2023-03-12 DIAGNOSIS — F413 Other mixed anxiety disorders: Secondary | ICD-10-CM

## 2023-03-12 DIAGNOSIS — N201 Calculus of ureter: Secondary | ICD-10-CM | POA: Diagnosis not present

## 2023-03-12 DIAGNOSIS — N130 Hydronephrosis with ureteropelvic junction obstruction: Secondary | ICD-10-CM | POA: Diagnosis not present

## 2023-03-12 DIAGNOSIS — I130 Hypertensive heart and chronic kidney disease with heart failure and stage 1 through stage 4 chronic kidney disease, or unspecified chronic kidney disease: Secondary | ICD-10-CM | POA: Insufficient documentation

## 2023-03-12 DIAGNOSIS — N134 Hydroureter: Secondary | ICD-10-CM | POA: Diagnosis not present

## 2023-03-12 DIAGNOSIS — M1990 Unspecified osteoarthritis, unspecified site: Secondary | ICD-10-CM | POA: Insufficient documentation

## 2023-03-12 DIAGNOSIS — I509 Heart failure, unspecified: Secondary | ICD-10-CM | POA: Insufficient documentation

## 2023-03-12 HISTORY — PX: CYSTOSCOPY WITH RETROGRADE PYELOGRAM, URETEROSCOPY AND STENT PLACEMENT: SHX5789

## 2023-03-12 LAB — CBC
HCT: 41.9 % (ref 36.0–46.0)
Hemoglobin: 14 g/dL (ref 12.0–15.0)
MCH: 32.5 pg (ref 26.0–34.0)
MCHC: 33.4 g/dL (ref 30.0–36.0)
MCV: 97.2 fL (ref 80.0–100.0)
Platelets: 205 10*3/uL (ref 150–400)
RBC: 4.31 MIL/uL (ref 3.87–5.11)
RDW: 13.7 % (ref 11.5–15.5)
WBC: 5 10*3/uL (ref 4.0–10.5)
nRBC: 0 % (ref 0.0–0.2)

## 2023-03-12 LAB — COMPREHENSIVE METABOLIC PANEL
ALT: 22 U/L (ref 0–44)
AST: 20 U/L (ref 15–41)
Albumin: 4.2 g/dL (ref 3.5–5.0)
Alkaline Phosphatase: 45 U/L (ref 38–126)
Anion gap: 8 (ref 5–15)
BUN: 32 mg/dL — ABNORMAL HIGH (ref 8–23)
CO2: 25 mmol/L (ref 22–32)
Calcium: 9.3 mg/dL (ref 8.9–10.3)
Chloride: 103 mmol/L (ref 98–111)
Creatinine, Ser: 1.07 mg/dL — ABNORMAL HIGH (ref 0.44–1.00)
GFR, Estimated: 57 mL/min — ABNORMAL LOW (ref >60)
Glucose, Bld: 92 mg/dL (ref 70–99)
Potassium: 4.5 mmol/L (ref 3.5–5.1)
Sodium: 136 mmol/L (ref 135–145)
Total Bilirubin: 0.5 mg/dL (ref 0.3–1.2)
Total Protein: 7 g/dL (ref 6.5–8.1)

## 2023-03-12 SURGERY — CYSTOURETEROSCOPY, WITH RETROGRADE PYELOGRAM AND STENT INSERTION
Anesthesia: General | Site: Ureter | Laterality: Left

## 2023-03-12 MED ORDER — 0.9 % SODIUM CHLORIDE (POUR BTL) OPTIME
TOPICAL | Status: DC | PRN
Start: 2023-03-12 — End: 2023-03-12
  Administered 2023-03-12: 1000 mL

## 2023-03-12 MED ORDER — ONDANSETRON HCL 4 MG/2ML IJ SOLN
INTRAMUSCULAR | Status: DC | PRN
  Administered 2023-03-12: 4 mg via INTRAVENOUS

## 2023-03-12 MED ORDER — OXYCODONE HCL 5 MG PO TABS: 5.0000 mg | ORAL_TABLET | Freq: Once | ORAL | Status: AC | PRN

## 2023-03-12 MED ORDER — LIDOCAINE 2% (20 MG/ML) 5 ML SYRINGE
INTRAMUSCULAR | Status: DC | PRN
  Administered 2023-03-12: 60 mg via INTRAVENOUS

## 2023-03-12 MED ORDER — ONDANSETRON HCL 4 MG/2ML IJ SOLN: 4.0000 mg | Freq: Once | INTRAMUSCULAR | Status: DC | PRN

## 2023-03-12 MED ORDER — LACTATED RINGERS IV SOLN: INTRAVENOUS | Status: DC

## 2023-03-12 MED ORDER — PROPOFOL 10 MG/ML IV BOLUS
INTRAVENOUS | Status: AC
  Filled 2023-03-12: qty 20

## 2023-03-12 MED ORDER — PROPOFOL 10 MG/ML IV BOLUS
INTRAVENOUS | Status: DC | PRN
  Administered 2023-03-12: 120 mg via INTRAVENOUS

## 2023-03-12 MED ORDER — EPHEDRINE 5 MG/ML INJ
INTRAVENOUS | Status: AC
  Filled 2023-03-12: qty 5

## 2023-03-12 MED ORDER — SODIUM CHLORIDE 0.9 % IR SOLN
Status: DC | PRN
  Administered 2023-03-12: 3000 mL

## 2023-03-12 MED ORDER — DEXAMETHASONE SODIUM PHOSPHATE 10 MG/ML IJ SOLN
INTRAMUSCULAR | Status: DC | PRN
  Administered 2023-03-12: 10 mg via INTRAVENOUS

## 2023-03-12 MED ORDER — EPHEDRINE SULFATE-NACL 50-0.9 MG/10ML-% IV SOSY
PREFILLED_SYRINGE | INTRAVENOUS | Status: DC | PRN
Start: 2023-03-12 — End: 2023-03-12
  Administered 2023-03-12: 5 mg via INTRAVENOUS
  Administered 2023-03-12: 10 mg via INTRAVENOUS

## 2023-03-12 MED ORDER — IOHEXOL 300 MG/ML  SOLN
INTRAMUSCULAR | Status: DC | PRN
  Administered 2023-03-12: 3.5 mL

## 2023-03-12 MED ORDER — DEXAMETHASONE SODIUM PHOSPHATE 10 MG/ML IJ SOLN
INTRAMUSCULAR | Status: AC
  Filled 2023-03-12: qty 1

## 2023-03-12 MED ORDER — HYDROMORPHONE HCL 1 MG/ML IJ SOLN
INTRAMUSCULAR | Status: AC
  Filled 2023-03-12: qty 1

## 2023-03-12 MED ORDER — HYDROMORPHONE HCL 1 MG/ML IJ SOLN
1.0000 mg | INTRAMUSCULAR | Status: AC
  Administered 2023-03-12: 1 mg via INTRAVENOUS

## 2023-03-12 MED ORDER — CHLORHEXIDINE GLUCONATE 0.12 % MT SOLN
15.0000 mL | Freq: Once | OROMUCOSAL | Status: AC
  Administered 2023-03-12: 15 mL via OROMUCOSAL

## 2023-03-12 MED ORDER — OXYCODONE HCL 5 MG/5ML PO SOLN: 5.0000 mg | Freq: Once | ORAL | Status: AC | PRN

## 2023-03-12 MED ORDER — FENTANYL CITRATE (PF) 100 MCG/2ML IJ SOLN
INTRAMUSCULAR | Status: AC
  Filled 2023-03-12: qty 2

## 2023-03-12 MED ORDER — SODIUM CHLORIDE 0.9 % IV SOLN
2.0000 g | INTRAVENOUS | Status: AC
  Administered 2023-03-12: 2 g via INTRAVENOUS
  Filled 2023-03-12: qty 20

## 2023-03-12 MED ORDER — LIDOCAINE HCL (PF) 2 % IJ SOLN
INTRAMUSCULAR | Status: AC
  Filled 2023-03-12: qty 5

## 2023-03-12 MED ORDER — ORAL CARE MOUTH RINSE: 15.0000 mL | Freq: Once | OROMUCOSAL | Status: AC

## 2023-03-12 MED ORDER — OXYCODONE HCL 5 MG PO TABS
ORAL_TABLET | ORAL | Status: AC
  Administered 2023-03-12: 5 mg via ORAL
  Filled 2023-03-12: qty 1

## 2023-03-12 MED ORDER — HYDROMORPHONE HCL 1 MG/ML IJ SOLN: 0.2500 mg | INTRAMUSCULAR | Status: DC | PRN

## 2023-03-12 MED ORDER — ONDANSETRON HCL 4 MG/2ML IJ SOLN
INTRAMUSCULAR | Status: AC
  Filled 2023-03-12: qty 2

## 2023-03-12 MED ORDER — FENTANYL CITRATE (PF) 100 MCG/2ML IJ SOLN
INTRAMUSCULAR | Status: DC | PRN
  Administered 2023-03-12: 50 ug via INTRAVENOUS

## 2023-03-12 SURGICAL SUPPLY — 25 items
BAG URO CATCHER STRL LF (MISCELLANEOUS) ×1 IMPLANT
BASKET ZERO TIP NITINOL 2.4FR (BASKET) IMPLANT
BSKT STON RTRVL ZERO TP 2.4FR (BASKET)
CATH URETERAL DUAL LUMEN 10F (MISCELLANEOUS) IMPLANT
CATH URETL OPEN END 6FR 70 (CATHETERS) IMPLANT
CLOTH BEACON ORANGE TIMEOUT ST (SAFETY) ×1 IMPLANT
FIBER LASER MOSES 200 DFL (Laser) IMPLANT
FIBER LASER MOSES 365 DFL (Laser) IMPLANT
GLOVE BIO SURGEON STRL SZ8.5 (GLOVE) ×1 IMPLANT
GOWN STRL REUS W/ TWL XL LVL3 (GOWN DISPOSABLE) ×1 IMPLANT
GOWN STRL REUS W/TWL XL LVL3 (GOWN DISPOSABLE) ×1
GUIDEWIRE AMPLATZ STIFF 0.35 (WIRE) IMPLANT
GUIDEWIRE STR DUAL SENSOR (WIRE) ×1 IMPLANT
GUIDEWIRE SUPER STIFF (WIRE) IMPLANT
GUIDEWIRE ZIPWRE .038 STRAIGHT (WIRE) IMPLANT
KIT TURNOVER KIT A (KITS) IMPLANT
MANIFOLD NEPTUNE II (INSTRUMENTS) ×1 IMPLANT
PACK CYSTO (CUSTOM PROCEDURE TRAY) ×1 IMPLANT
PAD PREP 24X48 CUFFED NSTRL (MISCELLANEOUS) ×1 IMPLANT
SHEATH NAVIGATOR HD 11/13X28 (SHEATH) IMPLANT
SHEATH NAVIGATOR HD 11/13X36 (SHEATH) IMPLANT
SHEATH NAVIGATOR HD 12/14X28 (SHEATH) IMPLANT
STENT URET 6FRX24 CONTOUR (STENTS) IMPLANT
TUBING CONNECTING 10 (TUBING) ×1 IMPLANT
TUBING UROLOGY SET (TUBING) ×1 IMPLANT

## 2023-03-12 NOTE — Progress Notes (Unsigned)
PERIOPERATIVE PRESCRIPTION FOR IMPLANTED CARDIAC DEVICE PROGRAMMING  Patient Information: Name:  Kim Pennington  DOB:  1955-07-15  MRN:  [id number]  Planned Procedure:  CYSTOSCOPY WITH RETROGRADE PYELOGRAM, URETEROSCOPY AND STENT PLACEMENT - Left  Surgeon:  Marcha Solders  Date of Procedure:  10/7  Cautery will be used.  Position during surgery:  Supine   Device Information:  Clinic EP Physician:  Dr. Steffanie Dunn  Device Type:  Pacemaker Manufacturer and Phone #:  St. Jude/Abbott: [phone] Pacemaker Dependent?:  No. Date of Last Device Check:  02/13/2023 Normal Device Function?:  Yes.    Electrophysiologist's Recommendations:  Have magnet available. Provide continuous ECG monitoring when magnet is used or reprogramming is to be performed.  Procedure should not interfere with device function.  No device programming or magnet placement needed.  Per Device Clinic Standing Orders, Wiliam Ke, RN  3:09 PM 03/12/2023

## 2023-03-12 NOTE — Telephone Encounter (Signed)
-----   Message from Kim Pennington sent at 03/12/2023  3:08 PM EDT ----- Regarding: post op FU Patient had a left ureteral stent placed today. I need her to come in 1-2 weeks for cysto and stent removal. thanks

## 2023-03-12 NOTE — Anesthesia Postprocedure Evaluation (Signed)
Anesthesia Post Note  Patient: Kim Pennington  Procedure(s) Performed: CYSTOSCOPY WITH RETROGRADE PYELOGRAM, URETEROSCOPY AND STENT PLACEMENT (Left: Ureter)     Patient location during evaluation: PACU Anesthesia Type: General Level of consciousness: awake and alert and oriented Pain management: pain level controlled Vital Signs Assessment: post-procedure vital signs reviewed and stable Respiratory status: spontaneous breathing, nonlabored ventilation and respiratory function stable Cardiovascular status: blood pressure returned to baseline and stable Postop Assessment: no apparent nausea or vomiting Anesthetic complications: no  No notable events documented.  Last Vitals:  Vitals:   03/12/23 1545 03/12/23 1600  BP: 130/78   Pulse: 83   Resp: 20 18  Temp:  (!) 36.4 C  SpO2: 98%     Last Pain:  Vitals:   03/12/23 1600  TempSrc:   PainSc: 0-No pain                 Kourtnei Rauber A.

## 2023-03-12 NOTE — Transfer of Care (Signed)
Immediate Anesthesia Transfer of Care Note  Patient: Kim Pennington  Procedure(s) Performed: CYSTOSCOPY WITH RETROGRADE PYELOGRAM, URETEROSCOPY AND STENT PLACEMENT (Left: Ureter)  Patient Location: PACU  Anesthesia Type:General  Level of Consciousness: awake, alert , and oriented  Airway & Oxygen Therapy: Patient Spontanous Breathing and Patient connected to face mask oxygen  Post-op Assessment: Report given to RN and Post -op Vital signs reviewed and stable  Post vital signs: Reviewed and stable  Last Vitals:  Vitals Value Taken Time  BP    Temp    Pulse 79 03/12/23 1528  Resp    SpO2 99 % 03/12/23 1528  Vitals shown include unfiled device data.  Last Pain:  Vitals:   03/12/23 1405  TempSrc:   PainSc: 10-Worst pain ever         Complications: No notable events documented.

## 2023-03-12 NOTE — Telephone Encounter (Signed)
Pt scheduled for 03/22/23 at 130p. Since pt had a procedure today, will call tomorrow with f/u appt info.

## 2023-03-12 NOTE — Anesthesia Procedure Notes (Signed)
Procedure Name: LMA Insertion Date/Time: 03/12/2023 2:28 PM  Performed by: Doran Clay, CRNAPre-anesthesia Checklist: Patient identified, Emergency Drugs available, Suction available, Timeout performed and Patient being monitored Patient Re-evaluated:Patient Re-evaluated prior to induction Oxygen Delivery Method: Circle system utilized Preoxygenation: Pre-oxygenation with 100% oxygen Induction Type: IV induction LMA: LMA inserted LMA Size: 3.0 Tube type: Oral Number of attempts: 1 Placement Confirmation: positive ETCO2 and breath sounds checked- equal and bilateral Tube secured with: Tape Dental Injury: Teeth and Oropharynx as per pre-operative assessment

## 2023-03-12 NOTE — H&P (Signed)
Assessment: Solitary left kidney with left distal ureteral calculus.   Plan: Discussed with patient option of prompt urinary tract drainage with stone extraction and stent placement.  Rationale especially given solitary kidney reviewed in detail.  She agrees to proceed.  Will add on today for cystoscopy with left retrograde with ureteroscopy possible laser lithotripsy stone extraction and placement of ureteral stent.  Chief Complaint: left flank pain  History of Present Illness:  Kim Pennington is a 67 y.o. female who [facility] today complaining of severe acute onset of left-sided pain nausea and overall malaise.  This began a few days ago.  She has not seen any blood in her urine.  Today on evaluation a CT stone study demonstrates a 2 mm calcification in the left distal ureter near the UVJ.  There is minimal left hydro proximally.  There is moderate left hydro.  Patient has a history of left UPJ obstruction and history of right nephrectomy in 2015 due to kidney stone disease. She last ate at 6 AM and had a few bites of a protein bar. At 10 AM she had a few sips of water.    Past Medical History:  Past Medical History:  Diagnosis Date   Acquired solitary kidney    right nephrectomy   Anemia    Anxiety    Arthritis    C. difficile diarrhea    Depression    Hepatitis B    Hyperlipidemia    Hypertension    Kidney disease, chronic, stage III (GFR 30-59 ml/min) (HCC)    Kidney stones    Lymphocytic colitis    Memory loss    Osteoporosis    Pacemaker    Renal failure, chronic, stage 3 (moderate) (HCC) 2015   Left kidney    Skin cancer     Past Surgical History:  Past Surgical History:  Procedure Laterality Date   ABDOMINAL HYSTERECTOMY  11/2006   ABDOMINOPLASTY     BREAST LUMPECTOMY Right 1979   CARDIAC CATHETERIZATION  04/24/2014   CARPAL TUNNEL RELEASE Bilateral 03/29/2009   CESAREAN SECTION     x4   COLONOSCOPY     FOOT SURGERY  Bilateral    HIP ARTHROSCOPY Right 09/14/2016   reduction of lesser trochanter    HIP ARTHROSCOPY Left 04/25/2016   w/ femoroplasty   left thumb tendon  04/25/2016   LIPOSUCTION     LUMBAR LAMINECTOMY  11/23/2019   foraminotomy, discectomy, facectomy   NEPHRECTOMY Right 2009   PACEMAKER IMPLANT  04/24/2014   RHINOPLASTY     x 2, 1999, 2003   ROTATOR CUFF REPAIR Right    SHOULDER ARTHROSCOPY Right 12/30/2018   x2   TONSILLECTOMY  1961   UMBILICAL HERNIA REPAIR  1998   UPPER GASTROINTESTINAL ENDOSCOPY     WEIL OSTEOTOMY Bilateral 11/27/2019    Allergies:  No Known Allergies  Family History:  Family History  Problem Relation Age of Onset   Diabetes Mother    Parkinson's disease Mother    Alzheimer's disease Mother    Heart disease Father    Alzheimer's disease Father    Hypertension Father    CVA Father    Rheum arthritis Father    Stomach cancer Sister    Hyperlipidemia Sister    Hypertension Sister    Diabetes Sister    Liver disease Sister    Drug abuse Sister    Obesity Sister    Hyperlipidemia Sister    Hypertension Sister  Diabetes Sister    Heart disease Sister    Obesity Sister    Diabetes Sister    Breast cancer Sister 54       x2   Hyperlipidemia Sister    Hypertension Sister    Colon polyps Sister    Obesity Sister    Hyperlipidemia Sister    Hypertension Sister    Obesity Sister    Hyperlipidemia Brother    Hypertension Brother    Heart disease Brother    Obesity Brother    Diabetes Maternal Aunt    Diabetes Maternal Grandmother    Heart attack Paternal Grandfather    Osteoarthritis Daughter    Obesity Son    Colon cancer Neg Hx    Esophageal cancer Neg Hx     Social History:  Social History   Tobacco Use   Smoking status: Never   Smokeless tobacco: Never  Vaping Use   Vaping status: Never Used  Substance Use Topics   Alcohol use: Never   Drug use: Never    Review of symptoms:  Constitutional:  Negative for unexplained  weight loss, night sweats, fever, chills ENT:  Negative for nose bleeds, sinus pain, painful swallowing CV:  Negative for chest pain, shortness of breath, exercise intolerance, palpitations, loss of consciousness Resp:  Negative for cough, wheezing, shortness of breath GI:  Negative for nausea, vomiting, diarrhea, bloody stools GU:  Positives noted in HPI; otherwise negative for gross hematuria, dysuria, urinary incontinence Neuro:  Negative for seizures, poor balance, limb weakness, slurred speech Psych:  Negative for lack of energy, depression, anxiety Endocrine:  Negative for polydipsia, polyuria, symptoms of hypoglycemia (dizziness, hunger, sweating) Hematologic:  Negative for anemia, purpura, petechia, prolonged or excessive bleeding, use of anticoagulants  Allergic:  Negative for difficulty breathing or choking as a result of exposure to anything; no shellfish allergy; no allergic response (rash/itch) to materials, foods  Physical exam: There were no vitals taken for this visit. GENERAL APPEARANCE:  Well appearing, well developed, well nourished, NAD HEENT: Atraumatic, Normocephalic, oropharynx clear. NECK: Supple without lymphadenopathy or thyromegaly. LUNGS: Clear to auscultation bilaterally. HEART: Regular Rate and Rhythm without murmurs, gallops, or rubs. ABDOMEN: Soft, non-tender, No Masses. EXTREMITIES: Moves all extremities well.  Without clubbing, cyanosis, or edema. NEUROLOGIC:  Alert and oriented x 3, normal gait, CN II-XII grossly intact.  MENTAL STATUS:  Appropriate. BACK:  Non-tender to palpation.  No CVAT SKIN:  Warm, dry and intact.

## 2023-03-12 NOTE — Progress Notes (Signed)
Pt. States flank pain continues at 12/10. Requesting meds for pain.  Called Dr. Malen Gauze; order given for Dilaudid 1mg  for pain.

## 2023-03-12 NOTE — Op Note (Signed)
OPERATIVE NOTE   Patient Name: Kim Pennington  MRN: [id number]   Date of Procedure: 03/12/2023   Preoperative diagnosis:  Solitary left kidney, left distal ureteral stone  Postoperative diagnosis:  Solitary left kidney; no stone present  Procedure:  Cysto, left retrograde, left ureteroscopy, left 6x24cm dj stent (tether removed)  Attending: Concepcion Living, MD  Anesthesia: general  Estimated blood loss: minimal  Fluids: Per anesthesia record  Drains: left dj stent  Specimens: none  Antibiotics: rocephin  Findings: mild hydro, no stone  Indications:  Solitary kidney, left flank pain and small left distal stone  Description of Procedure:  The patient was brought to the operating room where she was correctly identified and subsequently underwent general anesthesia uneventfully.  She was then positioned in the modified dorsolithotomy position and prepped and draped in the usual sterile fashion.  Preprocedural timeout was performed.  22 French panendoscope was subsequently inserted per urethra and the bladder was carefully examined.  Both ureteral openings appeared quite pinpoint in nature.  There were of normal location.  There were no focal mucosal lesions or evidence of stone within the bladder.  Left retrograde ureteropyelogram was subsequently performed with a 5 [place] open-ended catheter.  This revealed some mild hydro but no evidence of fixed filling defects within the ureter.  At this time to ensure that there is no residual stone I performed a left ureteroscopy.  I initially could not enter the left ureteral opening with the scope and subsequently dilated the UO in the short part of the intramural ureter with a 10-12 access sheath.  This allowed easy access with the scope.  I was able to pass the scope all the way up to the point just below the UPJ and carefully visualized the entire ureter.  There is no evidence of stone present.  The ureteroscope was subsequently  removed.  The cystoscope was replaced and under endoscopic and fluoroscopic control I placed a 6 x 24 cm double-J stent with good position obtained proximally and distally.  The bladder was then drained and the procedure terminated.  Patient tolerated the procedure well.  No apparent complications.  Complications: None  Condition: Stable, extubated, transferred to PACU  Plan: FU 1 week for cysto/stent removal

## 2023-03-12 NOTE — Anesthesia Preprocedure Evaluation (Addendum)
Anesthesia Evaluation  Patient identified by MRN, date of birth, ID band Patient awake    Reviewed: Allergy & Precautions, NPO status , Patient's Chart, lab work & pertinent test results  Airway Mallampati: II  TM Distance: >3 FB     Dental no notable dental hx. (+) Teeth Intact, Dental Advisory Given   Pulmonary neg pulmonary ROS   Pulmonary exam normal breath sounds clear to auscultation       Cardiovascular hypertension, Pt. on medications +CHF  Normal cardiovascular exam+ pacemaker  Rhythm:Regular Rate:Normal  EKG 05/18/22 NSR, low voltage in precordial leads   Neuro/Psych  Headaches PSYCHIATRIC DISORDERS Anxiety Depression     Neuromuscular disease    GI/Hepatic ,GERD  Medicated,,(+) Hepatitis -, B  Endo/Other  Hyperlipidemia  Renal/GU Renal InsufficiencyRenal diseaseSolitary left kidney S/P right nephrectomy 2021 Nephrotic Left ureteral calculus  negative genitourinary   Musculoskeletal  (+) Arthritis , Osteoarthritis and Rheumatoid disorders,  S/P lumbar laminectomy Rheumatoid Arthritis- methotrexate injection q week    Abdominal   Peds  Hematology  (+) Blood dyscrasia, anemia   Anesthesia Other Findings   Reproductive/Obstetrics                             Anesthesia Physical Anesthesia Plan  ASA: 3  Anesthesia Plan: General   Post-op Pain Management: Minimal or no pain anticipated and Ofirmev IV (intra-op)*   Induction: Intravenous  PONV Risk Score and Plan: Treatment may vary due to age or medical condition, Midazolam and Ondansetron  Airway Management Planned: LMA  Additional Equipment: None  Intra-op Plan:   Post-operative Plan: Extubation in OR  Informed Consent: I have reviewed the patients History and Physical, chart, labs and discussed the procedure including the risks, benefits and alternatives for the proposed anesthesia with the patient or authorized  representative who has indicated his/her understanding and acceptance.     Dental advisory given  Plan Discussed with: Anesthesiologist and CRNA  Anesthesia Plan Comments:         Anesthesia Quick Evaluation

## 2023-03-13 ENCOUNTER — Encounter (HOSPITAL_COMMUNITY): Payer: Self-pay | Admitting: Urology

## 2023-03-13 NOTE — Telephone Encounter (Signed)
Left msg with appt info. Requested a call back from patient to confirm appt.

## 2023-03-14 ENCOUNTER — Telehealth: Payer: Self-pay | Admitting: Urology

## 2023-03-14 NOTE — Telephone Encounter (Signed)
Pt called and LVM regarding appt that is sch for 10/17. Called patient back and LVM letting her know I returned her call and to call us back

## 2023-03-15 ENCOUNTER — Telehealth: Payer: Self-pay | Admitting: Urology

## 2023-03-15 DIAGNOSIS — R350 Frequency of micturition: Secondary | ICD-10-CM | POA: Diagnosis not present

## 2023-03-15 DIAGNOSIS — R3915 Urgency of urination: Secondary | ICD-10-CM | POA: Diagnosis not present

## 2023-03-15 DIAGNOSIS — R1084 Generalized abdominal pain: Secondary | ICD-10-CM | POA: Diagnosis not present

## 2023-03-15 NOTE — Telephone Encounter (Signed)
Patient had to cancel her cruise and needs documentation that says she should not go on a cruise in her condition with proof so she can get her money back.

## 2023-03-19 NOTE — Telephone Encounter (Signed)
Spoke with patient and she canceled her follow up appts with Dr. Margo Aye stating that she would be following up with her former Urologist for continued treatment. Kim Pennington

## 2023-03-22 ENCOUNTER — Encounter: Payer: Medicare Other | Admitting: Urology

## 2023-03-24 ENCOUNTER — Other Ambulatory Visit: Payer: Self-pay | Admitting: Family Medicine

## 2023-03-24 DIAGNOSIS — B002 Herpesviral gingivostomatitis and pharyngotonsillitis: Secondary | ICD-10-CM

## 2023-03-26 ENCOUNTER — Ambulatory Visit: Payer: Medicare Other | Admitting: Family Medicine

## 2023-03-26 DIAGNOSIS — N130 Hydronephrosis with ureteropelvic junction obstruction: Secondary | ICD-10-CM | POA: Diagnosis not present

## 2023-03-29 ENCOUNTER — Encounter: Payer: Self-pay | Admitting: Family Medicine

## 2023-03-29 ENCOUNTER — Ambulatory Visit (INDEPENDENT_AMBULATORY_CARE_PROVIDER_SITE_OTHER): Payer: Medicare Other | Admitting: Family Medicine

## 2023-03-29 VITALS — BP 116/76 | HR 77 | Temp 98.0°F | Resp 16 | Ht 62.0 in | Wt 131.2 lb

## 2023-03-29 DIAGNOSIS — M810 Age-related osteoporosis without current pathological fracture: Secondary | ICD-10-CM

## 2023-03-29 DIAGNOSIS — M0579 Rheumatoid arthritis with rheumatoid factor of multiple sites without organ or systems involvement: Secondary | ICD-10-CM | POA: Diagnosis not present

## 2023-03-29 DIAGNOSIS — M65311 Trigger thumb, right thumb: Secondary | ICD-10-CM | POA: Diagnosis not present

## 2023-03-29 DIAGNOSIS — F39 Unspecified mood [affective] disorder: Secondary | ICD-10-CM

## 2023-03-29 DIAGNOSIS — M5440 Lumbago with sciatica, unspecified side: Secondary | ICD-10-CM | POA: Diagnosis not present

## 2023-03-29 NOTE — Patient Instructions (Signed)
It was a pleasure meeting you today. Thank you for allowing me to take part in your health care.  Our goals for today as we discussed include:  You have received the Pneumonia 20 vaccine in 2023.  No further vaccines needed  This is a list of the screening recommended for you and due dates:  Health Maintenance  Topic Date Due   DTaP/Tdap/Td vaccine (1 - Tdap) Never done   Zoster (Shingles) Vaccine (1 of 2) Never done   COVID-19 Vaccine (4 - 2023-24 season) 02/04/2023   Mammogram  03/09/2023   Flu Shot  09/03/2023*   Medicare Annual Wellness Visit  12/20/2023   Colon Cancer Screening  10/01/2031   Pneumonia Vaccine  Completed   DEXA scan (bone density measurement)  Completed   Hepatitis C Screening  Completed   HPV Vaccine  Aged Out  *Topic was postponed. The date shown is not the original due date.    Follow up as needed  If you have any questions or concerns, please do not hesitate to call the office at [phone].  I look forward to our next visit and until then take care and stay safe.  Regards,   Dana Allan, MD   [facility]

## 2023-03-29 NOTE — Plan of Care (Signed)
CH[facility] Tonsillectomy/Adenoidectomy, Postoperative PEDS care plan entered in error.

## 2023-03-29 NOTE — Progress Notes (Signed)
SUBJECTIVE:   Chief Complaint  Patient presents with   Medical Management of Chronic Issues   HPI Presents to clinic for chronic disease management  Discussed the use of AI scribe software for clinical note transcription with the patient, who gave verbal consent to proceed.  History of Present Illness The patient, with a history of rheumatoid arthritis (RA), hypertension, and kidney stones, reports a significant improvement in her overall health status. She has been successfully weaned off most of her medications, with blood pressure well controlled. She was recently diagnosed with RA and started on a low dose of subcutaneous methotrexate once a week by a rheumatologist.  The patient has been following a meal plan called Opdivo since March, which involves six small meals a day to maintain metabolism. This has resulted in weight loss and now she is in the maintenance phase. She is also on Fosamax once a week and has successfully tapered off Xanax.  Two weeks ago, the patient underwent emergency surgery for kidney stones, which resulted in the placement of a stent. This event led to the cancellation of a planned cruise. The patient reports difficulty sleeping in the two weeks following the surgery. She is now trying to reschedule the cruise.  The patient is also on [employer]. The patient's blood pressure is well controlled. She is under the care of a cardiologist.    PERTINENT PMH / PSH: As above  OBJECTIVE:  BP 116/76   Pulse 77   Temp 98 F (36.7 C)   Resp 16   Ht 5\' 2"  (1.575 m)   Wt 131 lb 4 oz (59.5 kg)   SpO2 97%   BMI 24.01 kg/m    Physical Exam Vitals reviewed.  Constitutional:      General: She is not in acute distress.    Appearance: Normal appearance. She is normal weight. She is not ill-appearing, toxic-appearing or diaphoretic.  Eyes:     General:        Right eye: No discharge.        Left eye: No discharge.     Conjunctiva/sclera: Conjunctivae normal.   Cardiovascular:     Rate and Rhythm: Normal rate and regular rhythm.     Heart sounds: Normal heart sounds.  Pulmonary:     Effort: Pulmonary effort is normal.     Breath sounds: Normal breath sounds.  Abdominal:     General: Bowel sounds are normal.  Musculoskeletal:        General: Normal range of motion.  Skin:    General: Skin is warm and dry.  Neurological:     General: No focal deficit present.     Mental Status: She is alert and oriented to person, place, and time. Mental status is at baseline.  Psychiatric:        Mood and Affect: Mood normal.        Behavior: Behavior normal.        Thought Content: Thought content normal.        Judgment: Judgment normal.        03/29/2023    1:51 PM 02/06/2023    1:17 PM 12/20/2022   10:41 AM 11/09/2022    2:37 PM 09/21/2022   11:53 AM  Depression screen PHQ 2/9  Decreased Interest 0 0 0 0 0  Down, Depressed, Hopeless 0 0 0 0 0  PHQ - 2 Score 0 0 0 0 0  Altered sleeping 0 1 0 0 1  Tired, decreased energy 0  0 0 0 0  Change in appetite 0 0 0 0 0  Feeling bad or failure about yourself  0 0 0 0 0  Trouble concentrating 0 0 0 0 0  Moving slowly or fidgety/restless 0 0 0 0 0  Suicidal thoughts 0 0 0 0 0  PHQ-9 Score 0 1 0 0 1  Difficult doing work/chores Not difficult at all Not difficult at all Not difficult at all Not difficult at all Not difficult at all      03/29/2023    1:52 PM 02/06/2023    1:17 PM 11/09/2022    2:38 PM 09/21/2022   11:53 AM  GAD 7 : Generalized Anxiety Score  Nervous, Anxious, on Edge 0 0 0 0  Control/stop worrying 0 0 0 0  Worry too much - different things 1 0 0 0  Trouble relaxing 1 1 0 1  Restless 1 1 0 2  Easily annoyed or irritable 1 1 0 1  Afraid - awful might happen 0 0 0 0  Total GAD 7 Score 4 3 0 4  Anxiety Difficulty Not difficult at all Not difficult at all Not difficult at all Not difficult at all    ASSESSMENT/PLAN:  Rheumatoid arthritis involving multiple sites with positive rheumatoid  factor (HCC) Assessment & Plan: New diagnosis. Seronegative RA, now following with Rheumatology at [facility]. On low dose Methotrexate, increased to 7mg  weekly. No significant improvement noted.  -Continue Methotrexate as prescribed by rheumatologist.    Trigger finger of right thumb Assessment & Plan: Follows with Orthopedics, had steroid injection. -Plan for surgical release of trigger thumb.   Osteoporosis, unspecified osteoporosis type, unspecified pathological fracture presence Assessment & Plan: On Fosamax once weekly, no reported issues. -Continue Fosamax as prescribed.   Mood disorder (HCC) Assessment & Plan: Tapered off Xanax about a week ago, no current issues reported. -Keep Xanax prescription active for potential future use if needed.    Weight Management On Opdivo program since March, reports weight loss and now in maintenance phase. Blood pressure well controlled. -Continue Opdivo program.  Immunizations Received flu and COVID vaccines. Discussed and agreed to receive Pneumonia vaccine received 2023 Recommend Shingles vaccine Recommend Tetanus booster  PDMP reviewed  Return if symptoms worsen or fail to improve, for PCP.  Dana Allan, MD

## 2023-04-01 ENCOUNTER — Encounter: Payer: Self-pay | Admitting: Family Medicine

## 2023-04-01 DIAGNOSIS — M069 Rheumatoid arthritis, unspecified: Secondary | ICD-10-CM | POA: Insufficient documentation

## 2023-04-01 NOTE — Assessment & Plan Note (Signed)
Tapered off Xanax about a week ago, no current issues reported. -Keep Xanax prescription active for potential future use if needed.

## 2023-04-01 NOTE — Assessment & Plan Note (Signed)
On Fosamax once weekly, no reported issues. -Continue Fosamax as prescribed.

## 2023-04-01 NOTE — Assessment & Plan Note (Signed)
New diagnosis. Seronegative RA, now following with Rheumatology at [facility]. On low dose Methotrexate, increased to 7mg  weekly. No significant improvement noted.  -Continue Methotrexate as prescribed by rheumatologist.

## 2023-04-01 NOTE — Assessment & Plan Note (Signed)
Follows with Orthopedics, had steroid injection. -Plan for surgical release of trigger thumb.

## 2023-04-03 NOTE — Telephone Encounter (Signed)
Error

## 2023-04-25 DIAGNOSIS — M0609 Rheumatoid arthritis without rheumatoid factor, multiple sites: Secondary | ICD-10-CM | POA: Diagnosis not present

## 2023-04-25 DIAGNOSIS — Z7960 Long term (current) use of unspecified immunomodulators and immunosuppressants: Secondary | ICD-10-CM | POA: Diagnosis not present

## 2023-04-27 ENCOUNTER — Other Ambulatory Visit: Payer: Self-pay | Admitting: Family Medicine

## 2023-04-27 DIAGNOSIS — F5104 Psychophysiologic insomnia: Secondary | ICD-10-CM

## 2023-05-01 DIAGNOSIS — M2041 Other hammer toe(s) (acquired), right foot: Secondary | ICD-10-CM | POA: Diagnosis not present

## 2023-05-01 DIAGNOSIS — M2042 Other hammer toe(s) (acquired), left foot: Secondary | ICD-10-CM | POA: Diagnosis not present

## 2023-05-01 DIAGNOSIS — M0609 Rheumatoid arthritis without rheumatoid factor, multiple sites: Secondary | ICD-10-CM | POA: Diagnosis not present

## 2023-05-16 ENCOUNTER — Ambulatory Visit (INDEPENDENT_AMBULATORY_CARE_PROVIDER_SITE_OTHER): Payer: Medicare Other

## 2023-05-16 DIAGNOSIS — I495 Sick sinus syndrome: Secondary | ICD-10-CM

## 2023-05-16 LAB — CUP PACEART REMOTE DEVICE CHECK
Battery Remaining Longevity: 33 mo
Battery Remaining Percentage: 27 %
Battery Voltage: 2.93 V
Brady Statistic AP VP Percent: 1 %
Brady Statistic AP VS Percent: 15 %
Brady Statistic AS VP Percent: 1 %
Brady Statistic AS VS Percent: 85 %
Brady Statistic RA Percent Paced: 14 %
Brady Statistic RV Percent Paced: 1 %
Date Time Interrogation Session: 20241211020013
Implantable Lead Connection Status: 753985
Implantable Lead Connection Status: 753985
Implantable Lead Implant Date: 20151120
Implantable Lead Implant Date: 20151120
Implantable Lead Location: 753862
Implantable Lead Location: 753862
Implantable Pulse Generator Implant Date: 20151120
Lead Channel Impedance Value: 400 Ohm
Lead Channel Impedance Value: 590 Ohm
Lead Channel Pacing Threshold Amplitude: 0.75 V
Lead Channel Pacing Threshold Amplitude: 0.75 V
Lead Channel Pacing Threshold Pulse Width: 0.5 ms
Lead Channel Pacing Threshold Pulse Width: 0.5 ms
Lead Channel Sensing Intrinsic Amplitude: 1.5 mV
Lead Channel Sensing Intrinsic Amplitude: 5.8 mV
Lead Channel Setting Pacing Amplitude: 2 V
Lead Channel Setting Pacing Amplitude: 2 V
Lead Channel Setting Pacing Pulse Width: 0.5 ms
Lead Channel Setting Sensing Sensitivity: 0.5 mV
Pulse Gen Model: 2240
Pulse Gen Serial Number: 7689719

## 2023-05-31 ENCOUNTER — Ambulatory Visit
Admission: RE | Admit: 2023-05-31 | Discharge: 2023-05-31 | Disposition: A | Discharge status: Home / Self Care | Payer: Medicare Other | Source: Ambulatory Visit | Attending: Family Medicine | Admitting: Family Medicine

## 2023-05-31 DIAGNOSIS — Z780 Asymptomatic menopausal state: Secondary | ICD-10-CM | POA: Diagnosis not present

## 2023-05-31 DIAGNOSIS — M810 Age-related osteoporosis without current pathological fracture: Secondary | ICD-10-CM | POA: Diagnosis not present

## 2023-05-31 DIAGNOSIS — Z1231 Encounter for screening mammogram for malignant neoplasm of breast: Secondary | ICD-10-CM | POA: Insufficient documentation

## 2023-06-19 DIAGNOSIS — N130 Hydronephrosis with ureteropelvic junction obstruction: Secondary | ICD-10-CM | POA: Diagnosis not present

## 2023-06-22 NOTE — Progress Notes (Signed)
Remote pacemaker transmission.   

## 2023-06-25 ENCOUNTER — Other Ambulatory Visit: Payer: Self-pay | Admitting: Nurse Practitioner

## 2023-06-25 ENCOUNTER — Other Ambulatory Visit: Payer: Self-pay | Admitting: Family Medicine

## 2023-06-25 DIAGNOSIS — B002 Herpesviral gingivostomatitis and pharyngotonsillitis: Secondary | ICD-10-CM

## 2023-06-25 DIAGNOSIS — F4310 Post-traumatic stress disorder, unspecified: Secondary | ICD-10-CM

## 2023-06-28 DIAGNOSIS — M65311 Trigger thumb, right thumb: Secondary | ICD-10-CM | POA: Diagnosis not present

## 2023-07-12 DIAGNOSIS — M79644 Pain in right finger(s): Secondary | ICD-10-CM | POA: Diagnosis not present

## 2023-07-19 DIAGNOSIS — M79644 Pain in right finger(s): Secondary | ICD-10-CM | POA: Diagnosis not present

## 2023-08-02 DIAGNOSIS — M0609 Rheumatoid arthritis without rheumatoid factor, multiple sites: Secondary | ICD-10-CM | POA: Diagnosis not present

## 2023-08-02 DIAGNOSIS — Z7960 Long term (current) use of unspecified immunomodulators and immunosuppressants: Secondary | ICD-10-CM | POA: Diagnosis not present

## 2023-08-08 DIAGNOSIS — K080 Exfoliation of teeth due to systemic causes: Secondary | ICD-10-CM | POA: Diagnosis not present

## 2023-08-09 DIAGNOSIS — M25511 Pain in right shoulder: Secondary | ICD-10-CM | POA: Diagnosis not present

## 2023-08-09 DIAGNOSIS — M25512 Pain in left shoulder: Secondary | ICD-10-CM | POA: Diagnosis not present

## 2023-08-15 ENCOUNTER — Ambulatory Visit (INDEPENDENT_AMBULATORY_CARE_PROVIDER_SITE_OTHER): Payer: Medicare Other

## 2023-08-15 DIAGNOSIS — I495 Sick sinus syndrome: Secondary | ICD-10-CM | POA: Diagnosis not present

## 2023-08-15 LAB — CUP PACEART REMOTE DEVICE CHECK
Battery Remaining Longevity: 31 mo
Battery Remaining Percentage: 25 %
Battery Voltage: 2.92 V
Brady Statistic AP VP Percent: 1 %
Brady Statistic AP VS Percent: 13 %
Brady Statistic AS VP Percent: 1 %
Brady Statistic AS VS Percent: 87 %
Brady Statistic RA Percent Paced: 13 %
Brady Statistic RV Percent Paced: 1 %
Date Time Interrogation Session: 20250312020015
Implantable Lead Connection Status: 753985
Implantable Lead Connection Status: 753985
Implantable Lead Implant Date: 20151120
Implantable Lead Implant Date: 20151120
Implantable Lead Location: 753862
Implantable Lead Location: 753862
Implantable Pulse Generator Implant Date: 20151120
Lead Channel Impedance Value: 410 Ohm
Lead Channel Impedance Value: 590 Ohm
Lead Channel Pacing Threshold Amplitude: 0.75 V
Lead Channel Pacing Threshold Amplitude: 0.75 V
Lead Channel Pacing Threshold Pulse Width: 0.5 ms
Lead Channel Pacing Threshold Pulse Width: 0.5 ms
Lead Channel Sensing Intrinsic Amplitude: 0.9 mV
Lead Channel Sensing Intrinsic Amplitude: 6.3 mV
Lead Channel Setting Pacing Amplitude: 2 V
Lead Channel Setting Pacing Amplitude: 2 V
Lead Channel Setting Pacing Pulse Width: 0.5 ms
Lead Channel Setting Sensing Sensitivity: 0.5 mV
Pulse Gen Model: 2240
Pulse Gen Serial Number: 7689719

## 2023-08-16 ENCOUNTER — Encounter: Payer: Self-pay | Admitting: Cardiology

## 2023-08-23 ENCOUNTER — Encounter: Payer: Self-pay | Admitting: Family Medicine

## 2023-08-23 ENCOUNTER — Encounter: Payer: Self-pay | Admitting: Pharmacist

## 2023-08-23 ENCOUNTER — Ambulatory Visit (INDEPENDENT_AMBULATORY_CARE_PROVIDER_SITE_OTHER): Admitting: Family Medicine

## 2023-08-23 VITALS — BP 110/76 | HR 93 | Temp 98.0°F | Ht 62.0 in | Wt 136.0 lb

## 2023-08-23 DIAGNOSIS — M810 Age-related osteoporosis without current pathological fracture: Secondary | ICD-10-CM | POA: Diagnosis not present

## 2023-08-23 DIAGNOSIS — M5416 Radiculopathy, lumbar region: Secondary | ICD-10-CM | POA: Diagnosis not present

## 2023-08-23 DIAGNOSIS — M0600 Rheumatoid arthritis without rheumatoid factor, unspecified site: Secondary | ICD-10-CM | POA: Diagnosis not present

## 2023-08-23 DIAGNOSIS — F39 Unspecified mood [affective] disorder: Secondary | ICD-10-CM | POA: Diagnosis not present

## 2023-08-23 DIAGNOSIS — Z6825 Body mass index (BMI) 25.0-25.9, adult: Secondary | ICD-10-CM | POA: Diagnosis not present

## 2023-08-23 DIAGNOSIS — B002 Herpesviral gingivostomatitis and pharyngotonsillitis: Secondary | ICD-10-CM

## 2023-08-23 MED ORDER — ALENDRONATE SODIUM 70 MG PO TABS: 70.0000 mg | ORAL_TABLET | ORAL | 3 refills | Status: DC

## 2023-08-23 MED ORDER — VALACYCLOVIR HCL 1 G PO TABS: 1000.0000 mg | ORAL_TABLET | Freq: Two times a day (BID) | ORAL | 3 refills | Status: DC

## 2023-08-23 MED ORDER — ALPRAZOLAM 0.5 MG PO TABS: 0.5000 mg | ORAL_TABLET | ORAL | 5 refills | Status: DC | PRN

## 2023-08-23 NOTE — Patient Instructions (Addendum)
 It was a pleasure meeting you today. Thank you for allowing me to take part in your health care.  Our goals for today as we discussed include:  Refills sent for requested medication  This is a list of the screening recommended for you and due dates:  Health Maintenance  Topic Date Due   DTaP/Tdap/Td vaccine (1 - Tdap) Never done   Zoster (Shingles) Vaccine (2 of 2) 08/08/2022   COVID-19 Vaccine (6 - Pfizer risk 2024-25 season) 09/04/2023   Medicare Annual Wellness Visit  12/20/2023   Mammogram  05/30/2024   Colon Cancer Screening  10/01/2031   Pneumonia Vaccine  Completed   Flu Shot  Completed   DEXA scan (bone density measurement)  Completed   Hepatitis C Screening  Completed   HPV Vaccine  Aged Out      If you have any questions or concerns, please do not hesitate to call the office at [phone].  I look forward to our next visit and until then take care and stay safe.  Regards,   Dana Allan, MD   [facility]

## 2023-08-23 NOTE — Progress Notes (Signed)
 SUBJECTIVE:   Chief Complaint  Patient presents with   Rheumatoid Arthritis   HPI Presents to clinic for follow up chronic disease management  Discussed the use of AI scribe software for clinical note transcription with the patient, who gave verbal consent to proceed.  History of Present Illness Kim Pennington is a 68 year old female with rheumatoid arthritis who presents with concerns about her current treatment regimen.  She is following up regarding her rheumatoid arthritis management. Her symptoms include swelling in her hands, worsening pain in her toes and back, and flare-ups, which have not improved with her current treatment. She has been on methotrexate injections weekly for six months, but her liver enzymes have increased, and she feels the medication is ineffective.  She is also dealing with scoliosis and has had bilateral foot surgeries in the past to address issues related to her rheumatoid arthritis. She is concerned about the possibility of needing further surgery due to the painful nature of the procedure and the potential for recurrence of issues due to RA.  Her current medications include methotrexate injections, folic acid daily, alprazolam, and Fosamax. She is also taking Minipress at night. She is trying to taper off alprazolam but wishes to continue it due to stress related to her husband's failing health and the recent death of her sister.  In terms of social history, she is an Air cabin crew, promoting healthy habits and nutrition. She is the primary caregiver for her husband, who has heart failure, and she has recently experienced the loss of her sister due to obesity.  No chest pain, shortness of breath, fevers, constipation, diarrhea, or weight loss. She maintains her weight and is satisfied with her current health program.      PERTINENT PMH / PSH: As above  OBJECTIVE:  BP 110/76   Pulse 93   Temp 98 F (36.7 C)   Ht 5\' 2"  (1.575 m)    Wt 136 lb (61.7 kg)   SpO2 95%   BMI 24.87 kg/m    Physical Exam Vitals reviewed.  Constitutional:      General: She is not in acute distress.    Appearance: Normal appearance. She is normal weight. She is not ill-appearing, toxic-appearing or diaphoretic.  Eyes:     General:        Right eye: No discharge.        Left eye: No discharge.     Conjunctiva/sclera: Conjunctivae normal.  Cardiovascular:     Rate and Rhythm: Normal rate and regular rhythm.     Heart sounds: Normal heart sounds.  Pulmonary:     Effort: Pulmonary effort is normal.     Breath sounds: Normal breath sounds.  Abdominal:     General: Bowel sounds are normal.  Musculoskeletal:        General: Tenderness and deformity present. Normal range of motion.     Comments: PIP, MCP joints bilateral hands  Skin:    General: Skin is warm and dry.  Neurological:     General: No focal deficit present.     Mental Status: She is alert and oriented to person, place, and time. Mental status is at baseline.  Psychiatric:        Mood and Affect: Mood normal.        Behavior: Behavior normal.        Thought Content: Thought content normal.        Judgment: Judgment normal.  03/29/2023    1:51 PM 02/06/2023    1:17 PM 12/20/2022   10:41 AM 11/09/2022    2:37 PM 09/21/2022   11:53 AM  Depression screen PHQ 2/9  Decreased Interest 0 0 0 0 0  Down, Depressed, Hopeless 0 0 0 0 0  PHQ - 2 Score 0 0 0 0 0  Altered sleeping 0 1 0 0 1  Tired, decreased energy 0 0 0 0 0  Change in appetite 0 0 0 0 0  Feeling bad or failure about yourself  0 0 0 0 0  Trouble concentrating 0 0 0 0 0  Moving slowly or fidgety/restless 0 0 0 0 0  Suicidal thoughts 0 0 0 0 0  PHQ-9 Score 0 1 0 0 1  Difficult doing work/chores Not difficult at all Not difficult at all Not difficult at all Not difficult at all Not difficult at all      03/29/2023    1:52 PM 02/06/2023    1:17 PM 11/09/2022    2:38 PM 09/21/2022   11:53 AM  GAD 7 :  Generalized Anxiety Score  Nervous, Anxious, on Edge 0 0 0 0  Control/stop worrying 0 0 0 0  Worry too much - different things 1 0 0 0  Trouble relaxing 1 1 0 1  Restless 1 1 0 2  Easily annoyed or irritable 1 1 0 1  Afraid - awful might happen 0 0 0 0  Total GAD 7 Score 4 3 0 4  Anxiety Difficulty Not difficult at all Not difficult at all Not difficult at all Not difficult at all    ASSESSMENT/PLAN:  Rheumatoid arthritis with negative rheumatoid factor, involving unspecified site [facility]) Assessment & Plan: Worsening symptoms with methotrexate treatment, elevated liver enzymes suggest hepatotoxicity. Humira ordered but unaware of approval. Financial constraints limit Enbrel use. - Refer to pharmacist for Enbrel assistance programs. - Encourage contact with rheumatologist Dr. Allena Katz regarding Humira prescription and treatment changes. - Continue methotrexate injections until further notice from rheumatologist. - Continue daily folic acid supplementation.   Mood disorder [facility]) Assessment & Plan: Anxiety due to husband's health and sister's death.  Had weaned off but recent restarted due to increasing stress at home. - Refill alprazolam prescription with consideration for tapering as appropriate.   Orders: -     ALPRAZolam; Take 1 tablet (0.5 mg total) by mouth as needed for anxiety.  Dispense: 30 tablet; Refill: 5  Oral herpes Assessment & Plan: Chronic.  Outbreaks every 3 months. Refill Valtrex 1 g  Orders: -     valACYclovir HCl; Take 1 tablet (1,000 mg total) by mouth 2 (two) times daily.  Dispense: 30 tablet; Refill: 3  Osteoporosis, unspecified osteoporosis type, unspecified pathological fracture presence Assessment & Plan: On Fosamax, recent labs satisfactory. Vitamin D and creatinine levels monitored. - Refill Fosamax prescription. - Ensure annual monitoring of vitamin D and creatinine levels.    Orders: -     Alendronate Sodium; Take 1 tablet (70 mg total) by mouth  once a week.  Dispense: 12 tablet; Refill: 3      PDMP reviewed  Return if symptoms worsen or fail to improve, for PCP.  Dana Allan, MD

## 2023-08-23 NOTE — Progress Notes (Signed)
 Phone Call:  Reason for Call: Enbrel Cost  Summary of Call: Sees Dr. Allena Katz at [facility]  - Currently prescribed MTX for RA, could not tolerate titration (LFT elevation).  - Rheum discussed transition to Enbrel  - She has already applied for PAP Audiological scientist) and was denied due to her income exceeding program limits.   Coverage: Blue Medicare Choice (HMO) Plan ID: [id number] Drug Deductible: $375.00 (excludes Tiers 1, 2, and 6) Tier 5 (specialty drugs) - No deductible. Co-insurance 25% of drug cost  Patient reports current copay of $220/month which is significantly less than 25% of drug cost, unless this is calculated in some other way.  Provided patient with her specific insurance plan's summary of benefit and drug formulary to review with her Rheumatologist.  Recommend she also follow up with insurance via phone to confirm Tier 5 co-insurance rate (all biologics are Tier 5, meaning if she does have co-insurance then all meds aside from oral DMARDs may be expensive) Provided patient with links to a couple of Rheum Grants that are closed/waitlisted.     Loree Fee, PharmD Clinical Pharmacist [facility] [phone]

## 2023-08-29 ENCOUNTER — Encounter: Payer: Self-pay | Admitting: Family Medicine

## 2023-09-01 NOTE — Assessment & Plan Note (Signed)
 Anxiety due to husband's health and sister's death.  Had weaned off but recent restarted due to increasing stress at home. - Refill alprazolam prescription with consideration for tapering as appropriate.

## 2023-09-01 NOTE — Assessment & Plan Note (Signed)
 Worsening symptoms with methotrexate treatment, elevated liver enzymes suggest hepatotoxicity. Humira ordered but unaware of approval. Financial constraints limit Enbrel use. - Refer to pharmacist for Enbrel assistance programs. - Encourage contact with rheumatologist Dr. Allena Katz regarding Humira prescription and treatment changes. - Continue methotrexate injections until further notice from rheumatologist. - Continue daily folic acid supplementation.

## 2023-09-01 NOTE — Assessment & Plan Note (Signed)
 Chronic.  Outbreaks every 3 months. Refill Valtrex 1 g

## 2023-09-01 NOTE — Assessment & Plan Note (Signed)
 On Fosamax, recent labs satisfactory. Vitamin D and creatinine levels monitored. - Refill Fosamax prescription. - Ensure annual monitoring of vitamin D and creatinine levels.

## 2023-09-06 ENCOUNTER — Other Ambulatory Visit: Payer: Self-pay | Admitting: Family Medicine

## 2023-09-06 DIAGNOSIS — F39 Unspecified mood [affective] disorder: Secondary | ICD-10-CM

## 2023-09-07 NOTE — Telephone Encounter (Signed)
 Called pharmacy and they stated 5 refills.

## 2023-09-24 DIAGNOSIS — R1084 Generalized abdominal pain: Secondary | ICD-10-CM | POA: Diagnosis not present

## 2023-09-24 DIAGNOSIS — R6883 Chills (without fever): Secondary | ICD-10-CM | POA: Diagnosis not present

## 2023-09-24 DIAGNOSIS — K449 Diaphragmatic hernia without obstruction or gangrene: Secondary | ICD-10-CM | POA: Diagnosis not present

## 2023-09-24 DIAGNOSIS — N130 Hydronephrosis with ureteropelvic junction obstruction: Secondary | ICD-10-CM | POA: Diagnosis not present

## 2023-09-24 DIAGNOSIS — R109 Unspecified abdominal pain: Secondary | ICD-10-CM | POA: Diagnosis not present

## 2023-09-25 LAB — LAB REPORT - SCANNED: EGFR: 58.7

## 2023-09-26 DIAGNOSIS — D2261 Melanocytic nevi of right upper limb, including shoulder: Secondary | ICD-10-CM | POA: Diagnosis not present

## 2023-09-26 DIAGNOSIS — L570 Actinic keratosis: Secondary | ICD-10-CM | POA: Diagnosis not present

## 2023-09-26 DIAGNOSIS — D2262 Melanocytic nevi of left upper limb, including shoulder: Secondary | ICD-10-CM | POA: Diagnosis not present

## 2023-09-26 DIAGNOSIS — L820 Inflamed seborrheic keratosis: Secondary | ICD-10-CM | POA: Diagnosis not present

## 2023-09-26 DIAGNOSIS — D0439 Carcinoma in situ of skin of other parts of face: Secondary | ICD-10-CM | POA: Diagnosis not present

## 2023-09-26 DIAGNOSIS — D225 Melanocytic nevi of trunk: Secondary | ICD-10-CM | POA: Diagnosis not present

## 2023-09-26 DIAGNOSIS — D2272 Melanocytic nevi of left lower limb, including hip: Secondary | ICD-10-CM | POA: Diagnosis not present

## 2023-09-26 DIAGNOSIS — L538 Other specified erythematous conditions: Secondary | ICD-10-CM | POA: Diagnosis not present

## 2023-09-26 DIAGNOSIS — D485 Neoplasm of uncertain behavior of skin: Secondary | ICD-10-CM | POA: Diagnosis not present

## 2023-09-26 DIAGNOSIS — R208 Other disturbances of skin sensation: Secondary | ICD-10-CM | POA: Diagnosis not present

## 2023-10-01 NOTE — Progress Notes (Signed)
 Remote pacemaker transmission.

## 2023-10-02 DIAGNOSIS — M5416 Radiculopathy, lumbar region: Secondary | ICD-10-CM | POA: Diagnosis not present

## 2023-10-02 DIAGNOSIS — Z6825 Body mass index (BMI) 25.0-25.9, adult: Secondary | ICD-10-CM | POA: Diagnosis not present

## 2023-10-02 DIAGNOSIS — M546 Pain in thoracic spine: Secondary | ICD-10-CM | POA: Diagnosis not present

## 2023-10-04 DIAGNOSIS — M5414 Radiculopathy, thoracic region: Secondary | ICD-10-CM | POA: Diagnosis not present

## 2023-10-09 DIAGNOSIS — M2042 Other hammer toe(s) (acquired), left foot: Secondary | ICD-10-CM | POA: Diagnosis not present

## 2023-10-09 DIAGNOSIS — M0609 Rheumatoid arthritis without rheumatoid factor, multiple sites: Secondary | ICD-10-CM | POA: Diagnosis not present

## 2023-10-09 DIAGNOSIS — M2041 Other hammer toe(s) (acquired), right foot: Secondary | ICD-10-CM | POA: Diagnosis not present

## 2023-10-10 ENCOUNTER — Telehealth: Payer: Self-pay

## 2023-10-10 NOTE — Telephone Encounter (Signed)
 Copied from CRM [id number]. Topic: General - Other >> Oct 10, 2023 11:53 AM Albertha Alosa wrote: Reason for CRM: Patient called wanting to know her blood type and if it is in her chart, would like for a nurse to give her a callback regarding this

## 2023-10-11 DIAGNOSIS — M5414 Radiculopathy, thoracic region: Secondary | ICD-10-CM | POA: Diagnosis not present

## 2023-10-16 NOTE — Telephone Encounter (Signed)
 Called Patient and let her know that her blood type is not in her chart. I also let her know that Dr. Sueanne Emerald does not order blood type and screen. I also let the patient know that if you donated blood they will know your blood type and that is the fastest way to get the blood type.

## 2023-10-19 ENCOUNTER — Telehealth: Payer: Self-pay | Admitting: Gastroenterology

## 2023-10-19 NOTE — Telephone Encounter (Signed)
 Good morning Dr. Dominic Friendly,    I received a call from this patient wishing to schedule an appointment with our practice for a hiatal hernia. Patient was previously followed by you in 2023 and had a procedure done by you, She then went to [facility] last year. She would like to be seen by our practice but with a different physician. Would you be okay with this transfer?  Please advise,   Thank you.

## 2023-10-21 NOTE — Telephone Encounter (Signed)
 Since most of the providers with the [facility] in [place] have left, she can return to [facility].  Memory Staggers MD

## 2023-10-23 ENCOUNTER — Encounter: Payer: Self-pay | Admitting: Nurse Practitioner

## 2023-10-23 DIAGNOSIS — M5414 Radiculopathy, thoracic region: Secondary | ICD-10-CM | POA: Diagnosis not present

## 2023-10-30 DIAGNOSIS — Z7960 Long term (current) use of unspecified immunomodulators and immunosuppressants: Secondary | ICD-10-CM | POA: Diagnosis not present

## 2023-10-30 DIAGNOSIS — M0609 Rheumatoid arthritis without rheumatoid factor, multiple sites: Secondary | ICD-10-CM | POA: Diagnosis not present

## 2023-10-30 DIAGNOSIS — R7989 Other specified abnormal findings of blood chemistry: Secondary | ICD-10-CM | POA: Diagnosis not present

## 2023-11-13 DIAGNOSIS — M546 Pain in thoracic spine: Secondary | ICD-10-CM | POA: Diagnosis not present

## 2023-11-13 DIAGNOSIS — Z6825 Body mass index (BMI) 25.0-25.9, adult: Secondary | ICD-10-CM | POA: Diagnosis not present

## 2023-11-14 ENCOUNTER — Ambulatory Visit (INDEPENDENT_AMBULATORY_CARE_PROVIDER_SITE_OTHER): Payer: Self-pay

## 2023-11-14 ENCOUNTER — Encounter: Payer: Self-pay | Admitting: Student

## 2023-11-14 DIAGNOSIS — I495 Sick sinus syndrome: Secondary | ICD-10-CM

## 2023-11-14 LAB — CUP PACEART REMOTE DEVICE CHECK
Battery Remaining Longevity: 28 mo
Battery Remaining Percentage: 23 %
Battery Voltage: 2.92 V
Brady Statistic AP VP Percent: 1 %
Brady Statistic AP VS Percent: 14 %
Brady Statistic AS VP Percent: 1 %
Brady Statistic AS VS Percent: 86 %
Brady Statistic RA Percent Paced: 14 %
Brady Statistic RV Percent Paced: 1 %
Date Time Interrogation Session: 20250611020150
Implantable Lead Connection Status: 753985
Implantable Lead Connection Status: 753985
Implantable Lead Implant Date: 20151120
Implantable Lead Implant Date: 20151120
Implantable Lead Location: 753862
Implantable Lead Location: 753862
Implantable Pulse Generator Implant Date: 20151120
Lead Channel Impedance Value: 430 Ohm
Lead Channel Impedance Value: 600 Ohm
Lead Channel Pacing Threshold Amplitude: 0.75 V
Lead Channel Pacing Threshold Amplitude: 0.75 V
Lead Channel Pacing Threshold Pulse Width: 0.5 ms
Lead Channel Pacing Threshold Pulse Width: 0.5 ms
Lead Channel Sensing Intrinsic Amplitude: 1.4 mV
Lead Channel Sensing Intrinsic Amplitude: 6.7 mV
Lead Channel Setting Pacing Amplitude: 2 V
Lead Channel Setting Pacing Amplitude: 2 V
Lead Channel Setting Pacing Pulse Width: 0.5 ms
Lead Channel Setting Sensing Sensitivity: 0.5 mV
Pulse Gen Model: 2240
Pulse Gen Serial Number: 7689719

## 2023-11-15 ENCOUNTER — Other Ambulatory Visit: Payer: Self-pay | Admitting: Student

## 2023-11-15 ENCOUNTER — Ambulatory Visit: Admitting: Family Medicine

## 2023-11-15 ENCOUNTER — Encounter: Payer: Self-pay | Admitting: Family Medicine

## 2023-11-15 VITALS — BP 133/78 | HR 75 | Ht 62.0 in | Wt 135.0 lb

## 2023-11-15 DIAGNOSIS — M546 Pain in thoracic spine: Secondary | ICD-10-CM

## 2023-11-15 DIAGNOSIS — M059 Rheumatoid arthritis with rheumatoid factor, unspecified: Secondary | ICD-10-CM | POA: Diagnosis not present

## 2023-11-15 DIAGNOSIS — F4310 Post-traumatic stress disorder, unspecified: Secondary | ICD-10-CM

## 2023-11-15 DIAGNOSIS — F39 Unspecified mood [affective] disorder: Secondary | ICD-10-CM

## 2023-11-15 DIAGNOSIS — Z780 Asymptomatic menopausal state: Secondary | ICD-10-CM

## 2023-11-15 DIAGNOSIS — K219 Gastro-esophageal reflux disease without esophagitis: Secondary | ICD-10-CM

## 2023-11-15 DIAGNOSIS — M5416 Radiculopathy, lumbar region: Secondary | ICD-10-CM

## 2023-11-15 DIAGNOSIS — M810 Age-related osteoporosis without current pathological fracture: Secondary | ICD-10-CM

## 2023-11-15 DIAGNOSIS — I10 Essential (primary) hypertension: Secondary | ICD-10-CM | POA: Diagnosis not present

## 2023-11-15 DIAGNOSIS — Z79890 Hormone replacement therapy (postmenopausal): Secondary | ICD-10-CM

## 2023-11-15 DIAGNOSIS — N1831 Chronic kidney disease, stage 3a: Secondary | ICD-10-CM

## 2023-11-15 DIAGNOSIS — B002 Herpesviral gingivostomatitis and pharyngotonsillitis: Secondary | ICD-10-CM

## 2023-11-15 DIAGNOSIS — I5032 Chronic diastolic (congestive) heart failure: Secondary | ICD-10-CM

## 2023-11-15 DIAGNOSIS — F5104 Psychophysiologic insomnia: Secondary | ICD-10-CM

## 2023-11-15 DIAGNOSIS — E538 Deficiency of other specified B group vitamins: Secondary | ICD-10-CM

## 2023-11-15 DIAGNOSIS — D649 Anemia, unspecified: Secondary | ICD-10-CM

## 2023-11-15 DIAGNOSIS — Z950 Presence of cardiac pacemaker: Secondary | ICD-10-CM

## 2023-11-15 DIAGNOSIS — G8929 Other chronic pain: Secondary | ICD-10-CM

## 2023-11-15 DIAGNOSIS — Z85820 Personal history of malignant melanoma of skin: Secondary | ICD-10-CM

## 2023-11-15 MED ORDER — ALENDRONATE SODIUM 70 MG PO TABS: 70.0000 mg | ORAL_TABLET | ORAL | 3 refills | Status: AC

## 2023-11-15 MED ORDER — VALACYCLOVIR HCL 1 G PO TABS
1000.0000 mg | ORAL_TABLET | Freq: Two times a day (BID) | ORAL | Status: AC | PRN
Start: 2023-11-15 — End: ?

## 2023-11-15 MED ORDER — ALPRAZOLAM 0.5 MG PO TABS: 0.5000 mg | ORAL_TABLET | ORAL | 5 refills | Status: DC | PRN

## 2023-11-15 MED ORDER — PRAZOSIN HCL 1 MG PO CAPS: 1.0000 mg | ORAL_CAPSULE | Freq: Every day | ORAL | 3 refills | Status: DC

## 2023-11-15 NOTE — Assessment & Plan Note (Signed)
 Chronic  F/u with podiatry

## 2023-11-15 NOTE — Assessment & Plan Note (Signed)
 Chronic Diastolic Heart Failure, stable Chronic diastolic heart failure under electrophysiologist Dr. Candace Cerise care. Blood pressure well-controlled at 133/78 mmHg. Primary caregiver for husband with significant heart issues, adding stress to her condition. - Continue current management and follow up with Dr. Marven Slimmer.

## 2023-11-15 NOTE — Assessment & Plan Note (Signed)
 Rheumatoid Arthritis Rheumatoid arthritis with significant joint involvement, particularly in the hands. Recently switched from methotrexate to Humira due to inadequate symptom control. Two doses of Humira administered biweekly. Reports significant joint pain and swelling. Underwent trigger release surgery for a hand issue. Under rheumatologist Dr. Basilio Both care. Concerned about Humira's cost but obtained through assistance programs. - Continue Humira biweekly. - Follow up with rheumatologist Dr. Lydia Sams.
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
## 2023-11-15 NOTE — Assessment & Plan Note (Signed)
 Chronic  Stable  No symptoms

## 2023-11-15 NOTE — Assessment & Plan Note (Signed)
  Chronic Renal Insufficiency, Stage 3 Stage 3 chronic renal insufficiency under urologist Dr. Ragena Bunker care. Creatinine level 1.0, GFR 58 in October labs. Takes cranberry supplements to prevent UTIs as advised by Dr. Derrick Fling. - Continue monitoring renal function with Dr. Derrick Fling. - Maintain current medications and lifestyle modifications.

## 2023-11-15 NOTE — Assessment & Plan Note (Signed)
 Insomnia, chronic  Insomnia exacerbated by stress related to husband's health issues. Uses alprazolam  as needed for sleep, preferring to continue due to current stressors. - Continue alprazolam  as needed for insomnia. - Refill alprazolam  prescription.

## 2023-11-15 NOTE — Assessment & Plan Note (Signed)
 Osteoporosis Chronic Osteoporosis managed with Fosamax , taken weekly. Nearing end of current prescription. - Continue Fosamax  70 mg weekly. - Refill Fosamax  prescription.

## 2023-11-15 NOTE — Assessment & Plan Note (Signed)
 Chronic  Holding B12 supplement due to elevated levels

## 2023-11-15 NOTE — Assessment & Plan Note (Signed)
 Chronic  Well controlled  Continue lifestyle modification
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
## 2023-11-17 ENCOUNTER — Ambulatory Visit: Payer: Self-pay | Admitting: Cardiology

## 2023-11-20 ENCOUNTER — Other Ambulatory Visit

## 2023-11-22 NOTE — Discharge Instructions (Signed)

## 2023-11-23 ENCOUNTER — Ambulatory Visit
Admission: RE | Admit: 2023-11-23 | Discharge: 2023-11-23 | Disposition: A | Discharge status: Home / Self Care | Source: Ambulatory Visit | Attending: Student | Admitting: Student

## 2023-11-23 DIAGNOSIS — M546 Pain in thoracic spine: Secondary | ICD-10-CM | POA: Diagnosis not present

## 2023-11-23 DIAGNOSIS — M47814 Spondylosis without myelopathy or radiculopathy, thoracic region: Secondary | ICD-10-CM | POA: Diagnosis not present

## 2023-11-23 MED ORDER — ONDANSETRON HCL 4 MG/2ML IJ SOLN: 4.0000 mg | Freq: Once | INTRAMUSCULAR | Status: DC | PRN

## 2023-11-23 MED ORDER — DIAZEPAM 5 MG PO TABS
5.0000 mg | ORAL_TABLET | Freq: Once | ORAL | Status: AC
  Administered 2023-11-23: 5 mg via ORAL

## 2023-11-23 MED ORDER — IOPAMIDOL (ISOVUE-M 300) INJECTION 61%
10.0000 mL | Freq: Once | INTRAMUSCULAR | Status: AC
Start: 2023-11-23 — End: 2023-11-23
  Administered 2023-11-23: 10 mL via INTRATHECAL

## 2023-11-23 MED ORDER — MEPERIDINE HCL 50 MG/ML IJ SOLN: 50.0000 mg | Freq: Once | INTRAMUSCULAR | Status: DC | PRN

## 2023-12-03 DIAGNOSIS — M5416 Radiculopathy, lumbar region: Secondary | ICD-10-CM | POA: Diagnosis not present

## 2023-12-17 DIAGNOSIS — M65331 Trigger finger, right middle finger: Secondary | ICD-10-CM | POA: Diagnosis not present

## 2023-12-17 DIAGNOSIS — Z4789 Encounter for other orthopedic aftercare: Secondary | ICD-10-CM | POA: Diagnosis not present

## 2023-12-17 DIAGNOSIS — M67441 Ganglion, right hand: Secondary | ICD-10-CM | POA: Diagnosis not present

## 2023-12-18 DIAGNOSIS — Z6825 Body mass index (BMI) 25.0-25.9, adult: Secondary | ICD-10-CM | POA: Diagnosis not present

## 2023-12-18 DIAGNOSIS — M546 Pain in thoracic spine: Secondary | ICD-10-CM | POA: Diagnosis not present

## 2023-12-20 ENCOUNTER — Ambulatory Visit: Admitting: Nurse Practitioner

## 2024-01-07 ENCOUNTER — Ambulatory Visit: Payer: Self-pay | Admitting: Cardiology

## 2024-01-07 ENCOUNTER — Ambulatory Visit: Attending: Cardiology | Admitting: Cardiology

## 2024-01-07 VITALS — BP 144/84 | HR 85 | Ht 62.0 in | Wt 140.0 lb

## 2024-01-07 DIAGNOSIS — I495 Sick sinus syndrome: Secondary | ICD-10-CM | POA: Diagnosis not present

## 2024-01-07 DIAGNOSIS — Z950 Presence of cardiac pacemaker: Secondary | ICD-10-CM

## 2024-01-07 LAB — CUP PACEART INCLINIC DEVICE CHECK
Battery Remaining Longevity: 30 mo
Battery Voltage: 2.92 V
Brady Statistic RA Percent Paced: 14 %
Brady Statistic RV Percent Paced: 0.01 %
Date Time Interrogation Session: 20250804151904
Implantable Lead Connection Status: 753985
Implantable Lead Connection Status: 753985
Implantable Lead Implant Date: 20151120
Implantable Lead Implant Date: 20151120
Implantable Lead Location: 753862
Implantable Lead Location: 753862
Implantable Pulse Generator Implant Date: 20151120
Lead Channel Impedance Value: 412.5 Ohm
Lead Channel Impedance Value: 662.5 Ohm
Lead Channel Pacing Threshold Amplitude: 0.5 V
Lead Channel Pacing Threshold Amplitude: 0.5 V
Lead Channel Pacing Threshold Amplitude: 0.75 V
Lead Channel Pacing Threshold Amplitude: 0.75 V
Lead Channel Pacing Threshold Pulse Width: 0.5 ms
Lead Channel Pacing Threshold Pulse Width: 0.5 ms
Lead Channel Pacing Threshold Pulse Width: 0.5 ms
Lead Channel Pacing Threshold Pulse Width: 0.5 ms
Lead Channel Sensing Intrinsic Amplitude: 1.2 mV
Lead Channel Sensing Intrinsic Amplitude: 9.3 mV
Lead Channel Setting Pacing Amplitude: 2 V
Lead Channel Setting Pacing Amplitude: 2 V
Lead Channel Setting Pacing Pulse Width: 0.5 ms
Lead Channel Setting Sensing Sensitivity: 0.5 mV
Pulse Gen Model: 2240
Pulse Gen Serial Number: 7689719

## 2024-01-07 NOTE — Patient Instructions (Signed)
 Medication Instructions:  The current medical regimen is effective;  continue present plan and medications as directed. Please refer to the Current Medication list given to you today.   *If you need a refill on your cardiac medications before your next appointment, please call your pharmacy*  Follow-Up: At [facility], you and your health needs are our priority.  As part of our continuing mission to provide you with exceptional heart care, our providers are all part of one team.  This team includes your primary Cardiologist (physician) and Advanced Practice Providers or APPs (Physician Assistants and Nurse Practitioners) who all work together to provide you with the care you need, when you need it.  Your next appointment:   2 year(s)  Provider:   Ole Holts, MD or Suzann Riddle, NP    We recommend signing up for the patient portal called MyChart.  Sign up information is provided on this After Visit Summary.  MyChart is used to connect with patients for Virtual Visits (Telemedicine).  Patients are able to view lab/test results, encounter notes, upcoming appointments, etc.  Non-urgent messages can be sent to your provider as well.   To learn more about what you can do with MyChart, go to [email or web address].

## 2024-01-07 NOTE — Progress Notes (Signed)
 Electrophysiology Clinic Note    Date:  01/07/2024  Patient ID:  Kim, Pennington [birth year], MRN [id number] PCP:  Sharma Coyer, MD  Cardiologist:  None   Electrophysiologist:  OLE ONEIDA HOLTS, MD  Electrophysiology APP:  Charlotte Brafford, NP      Discussed the use of AI scribe software for clinical note transcription with the patient, who gave verbal consent to proceed.   Patient Profile    Chief Complaint: PPM follow-up  History of Present Illness: Kim Pennington is a 68 y.o. female with PMH notable for SND s/p PPM, syncope, HTN, CKD - 3, OSA; seen today for OLE ONEIDA HOLTS, MD for routine electrophysiology followup.   She last saw Dr. HOLTS 07/2022 after hospitalization for syncope, thought to be vasovagal. No concerning arrhythmias on device interrogation.   On follow-up today, she has had no further syncopal episodes. She was recently hiking on vacation and had several instances of palpitations. No significant SOB but definitely an awareness of the episodes and her breathing. The palpitations were very brief in duration.  She has no chest pain, chest pressure. She remains very active working out regularly.     Arrhythmia/Device History St Jude dual chamber PPM, imp 04/2014; dx SND    ROS:  Please see the history of present illness. All other systems are reviewed and otherwise negative.    Physical Exam    VS:  BP (!) 144/84 (BP Location: Left Arm, Patient Position: Sitting, Cuff Size: Normal)   Pulse 85   Ht 5' 2 (1.575 m)   Wt 140 lb (63.5 kg)   SpO2 98%   BMI 25.61 kg/m  BMI: Body mass index is 25.61 kg/m.     Wt Readings from Last 3 Encounters:  01/07/24 140 lb (63.5 kg)  11/15/23 135 lb (61.2 kg)  08/23/23 136 lb (61.7 kg)     GEN- The patient is well appearing, alert and oriented x 3 today.   Lungs- Clear to ausculation bilaterally, normal work of breathing.  Heart- Regular rate and rhythm, no murmurs, rubs or  gallops Extremities- No peripheral edema, warm, dry Skin-  device pocket well-healed, no tethering   Device interrogation done today and reviewed by myself:  Battery 2-2.5 years Lead thresholds, impedence, sensing stable  Several HVF episodes, do not see beginning of episode, appears atrially driven Several PMT episodes No changes made today   Studies Reviewed   Previous EP, cardiology notes.    EKG is ordered. Personal review of EKG from today shows:    EKG Interpretation Date/Time:  Monday January 07 2024 13:20:21 EDT Ventricular Rate:  85 PR Interval:  154 QRS Duration:  66 QT Interval:  358 QTC Calculation: 426 R Axis:   -7  Text Interpretation: Normal sinus rhythm Low voltage QRS Confirmed by Avrianna Smart [phone]) on 01/07/2024 1:23:22 PM     TTE, 05/18/2022  1. Left ventricular ejection fraction, by estimation, is 60 to 65%. Left ventricular ejection fraction by 3D volume is 61 %. The left ventricle has normal function. The left ventricle has no regional wall motion abnormalities. Left ventricular diastolic parameters were normal. The average left ventricular global longitudinal strain is -18.9 %. The global longitudinal strain is normal.   2. Right ventricular systolic function is normal. The right ventricular size is normal. There is normal pulmonary artery systolic pressure. The estimated right ventricular systolic pressure is 34.4 mmHg.   3. Left atrial size was mildly dilated.   4. The mitral valve  is normal in structure. Trivial mitral valve regurgitation. No evidence of mitral stenosis.   5. The aortic valve is normal in structure. Aortic valve regurgitation is not visualized. No aortic stenosis is present.   6. The inferior vena cava is dilated in size with <50% respiratory variability, suggesting right atrial pressure of 15 mmHg.    Assessment and Plan     #) SND s/p PPM Device functioning well, few episodes of PMT that broke with algorithm Also have elevated  VR episodes, appears atrially driven Episodes are relatively short in duration in terms of symptoms and do not effect her physical activity tolerance or endurance Will continue to monitor       Current medicines are reviewed at length with the patient today.   The patient does not have concerns regarding her medicines.  The following changes were made today:  none  Labs/ tests ordered today include:  Orders Placed This Encounter  Procedures   EKG 12-Lead     Disposition: Follow up with Dr. Cindie or EP APP in 2 years   Continue remote monitoring every 3 months   Signed, Adyn Hoes, NP  01/07/24  3:18 PM  Electrophysiology [facility]

## 2024-01-09 DIAGNOSIS — L820 Inflamed seborrheic keratosis: Secondary | ICD-10-CM | POA: Diagnosis not present

## 2024-01-09 DIAGNOSIS — D0439 Carcinoma in situ of skin of other parts of face: Secondary | ICD-10-CM | POA: Diagnosis not present

## 2024-01-09 DIAGNOSIS — L905 Scar conditions and fibrosis of skin: Secondary | ICD-10-CM | POA: Diagnosis not present

## 2024-01-09 DIAGNOSIS — L2989 Other pruritus: Secondary | ICD-10-CM | POA: Diagnosis not present

## 2024-01-09 DIAGNOSIS — L538 Other specified erythematous conditions: Secondary | ICD-10-CM | POA: Diagnosis not present

## 2024-01-09 NOTE — Progress Notes (Signed)
 Remote pacemaker transmission.

## 2024-01-15 ENCOUNTER — Ambulatory Visit (INDEPENDENT_AMBULATORY_CARE_PROVIDER_SITE_OTHER): Admitting: Family Medicine

## 2024-01-15 VITALS — BP 155/78 | HR 59 | Resp 16 | Ht 63.0 in | Wt 130.0 lb

## 2024-01-15 DIAGNOSIS — K219 Gastro-esophageal reflux disease without esophagitis: Secondary | ICD-10-CM | POA: Diagnosis not present

## 2024-01-15 DIAGNOSIS — I5032 Chronic diastolic (congestive) heart failure: Secondary | ICD-10-CM | POA: Diagnosis not present

## 2024-01-15 DIAGNOSIS — M059 Rheumatoid arthritis with rheumatoid factor, unspecified: Secondary | ICD-10-CM

## 2024-01-15 DIAGNOSIS — Z131 Encounter for screening for diabetes mellitus: Secondary | ICD-10-CM

## 2024-01-15 DIAGNOSIS — M810 Age-related osteoporosis without current pathological fracture: Secondary | ICD-10-CM

## 2024-01-15 DIAGNOSIS — Z0001 Encounter for general adult medical examination with abnormal findings: Secondary | ICD-10-CM

## 2024-01-15 DIAGNOSIS — Z0000 Encounter for general adult medical examination without abnormal findings: Secondary | ICD-10-CM

## 2024-01-15 DIAGNOSIS — Z13220 Encounter for screening for lipoid disorders: Secondary | ICD-10-CM

## 2024-01-15 DIAGNOSIS — I10 Essential (primary) hypertension: Secondary | ICD-10-CM | POA: Diagnosis not present

## 2024-01-15 NOTE — Progress Notes (Signed)
 Subjective:   Kim Pennington is a 68 y.o. female who presents for Medicare Annual (Subsequent) preventive examination.  Visit Complete: In person  Patient Medicare AWV questionnaire was completed by the patient on 01/15/24 ; I have confirmed that all information answered by patient is correct and no changes since this date.   Discussed the use of AI scribe software for clinical note transcription with the patient, who gave verbal consent to proceed.  History of Present Illness Kim Pennington is a 68 year old female with hypertension and chronic diastolic heart failure who presents for a wellness visit.  She is experiencing increased stress due to caring for her husband post-surgery, which has impacted her sleep. She reports not getting much sleep as her husband sleeps better during the day than at night.  Her current medications include Fosamax  70 mg weekly, Xanax  0.5 mg as needed for anxiety, Humira, Flomax  0.4 mg at bedtime, Valtrex  1000 mg twice a day as needed, and Minipress  1 mg at bedtime, although she reports not taking Minipress . She has a history of hypertension, chronic diastolic heart failure, oral HSV, osteoporosis, rheumatoid arthritis, mood disorder, and a pacemaker. She also has a history of migraine headaches and reflux.  She has received two doses of the Shingrix vaccine and has had multiple COVID-19 vaccine doses. She has not had a tetanus vaccine in over ten years.  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 She is currently learning Spanish and ASL with her daughter.         Objective:    Today's Vitals   01/15/24 1347 01/15/24 1349  BP: (!) 153/83 (!) 155/78  Pulse: 63 (!) 59  Resp: 16   SpO2: 97%   Weight: 130 lb (59 kg)   Height: 5' 3 (1.6 m)    Body mass index is 23.03 kg/m.     12/20/2022   10:46 AM 05/19/2022    2:00 AM 11/28/2021    2:44 PM  02/21/2021    4:09 PM 04/10/2020    4:00 AM  Advanced Directives  Does Patient Have a Medical Advance Directive? Yes No No Yes Yes  Type of Estate agent of [place];Living will   Healthcare Power of [place];Living will --  Does patient want to make changes to medical advance directive? No - Patient declined   No - Patient declined No - Patient declined  Copy of Healthcare Power of Attorney in Chart? Yes - validated most recent copy scanned in chart (See row information)      Would patient like information on creating a medical advance directive?  No - Patient declined Yes (MAU/Ambulatory/Procedural Areas - Information given)      Current Medications (verified) Outpatient Encounter Medications as of 01/15/2024  Medication Sig   alendronate  (FOSAMAX ) 70 MG tablet Take 1 tablet (70 mg total) by mouth once a week.   ALPRAZolam  (XANAX ) 0.5 MG tablet Take 1 tablet (0.5 mg total) by mouth as needed for anxiety.   Calcium  Carbonate-Vit D-Min (CALCIUM  1200 PO) Take 1 tablet by mouth daily.   CRANBERRY PO Take 1 tablet by mouth daily.   HUMIRA, 2 PEN, 40 MG/0.4ML pen    tamsulosin  (FLOMAX ) 0.4 MG CAPS capsule Take 0.4 mg by mouth at bedtime.   valACYclovir  (VALTREX ) 1000 MG tablet Take 1 tablet (1,000 mg total) by mouth 2 (two) times daily as needed.   prazosin  (MINIPRESS )  1 MG capsule Take 1 capsule (1 mg total) by mouth at bedtime. (Patient not taking: Reported on 01/15/2024)   No facility-administered encounter medications on file as of 01/15/2024.    Allergies (verified) Patient has no known allergies.   History: Past Medical History:  Diagnosis Date   Acquired solitary kidney    right nephrectomy   Anemia    Anxiety    Arthritis    C. difficile diarrhea    Depression    Hepatitis B    Hyperlipidemia    Hypertension    Kidney disease, chronic, stage III (GFR 30-59 ml/min) (HCC)    Kidney stones    Lymphocytic colitis    Memory loss    Osteoporosis     Pacemaker    Renal failure, chronic, stage 3 (moderate) (HCC) 2015   Left kidney    Skin cancer    Past Surgical History:  Procedure Laterality Date   ABDOMINAL HYSTERECTOMY  11/2006   ABDOMINOPLASTY     BREAST LUMPECTOMY Right 1979   CARDIAC CATHETERIZATION  04/24/2014   CARPAL TUNNEL RELEASE Bilateral 03/29/2009   CESAREAN SECTION     x4   COLONOSCOPY     CYSTOSCOPY WITH RETROGRADE PYELOGRAM, URETEROSCOPY AND STENT PLACEMENT Left 03/12/2023   Procedure: CYSTOSCOPY WITH RETROGRADE PYELOGRAM, URETEROSCOPY AND STENT PLACEMENT;  Surgeon: Shona Layman BROCKS, MD;  Location: [facility];  Service: Urology;  Laterality: Left;   FOOT SURGERY Bilateral    HIP ARTHROSCOPY Right 09/14/2016   reduction of lesser trochanter    HIP ARTHROSCOPY Left 04/25/2016   w/ femoroplasty   left thumb tendon  04/25/2016   LIPOSUCTION     LUMBAR LAMINECTOMY  11/23/2019   foraminotomy, discectomy, facectomy   NEPHRECTOMY Right 2009   PACEMAKER IMPLANT  04/24/2014   RHINOPLASTY     x 2, 1999, 2003   ROTATOR CUFF REPAIR Right    SHOULDER ARTHROSCOPY Right 12/30/2018   x2   TONSILLECTOMY  1961   UMBILICAL HERNIA REPAIR  1998   UPPER GASTROINTESTINAL ENDOSCOPY     WEIL OSTEOTOMY Bilateral 11/27/2019   Family History  Problem Relation Age of Onset   Diabetes Mother    Parkinson's disease Mother    Alzheimer's disease Mother    Heart disease Father    Alzheimer's disease Father    Hypertension Father    CVA Father    Rheum arthritis Father    Stomach cancer Sister    Hyperlipidemia Sister    Hypertension Sister    Diabetes Sister    Liver disease Sister    Drug abuse Sister    Obesity Sister    Hyperlipidemia Sister    Hypertension Sister    Diabetes Sister    Heart disease Sister    Obesity Sister    Diabetes Sister    Breast cancer Sister 30       x2   Hyperlipidemia Sister    Hypertension Sister    Colon polyps Sister    Obesity Sister    Hyperlipidemia Sister    Hypertension Sister     Obesity Sister    Hyperlipidemia Brother    Hypertension Brother    Heart disease Brother    Obesity Brother    Diabetes Maternal Aunt    Diabetes Maternal Grandmother    Heart attack Paternal Grandfather    Osteoarthritis Daughter    Obesity Son    Colon cancer Neg Hx    Esophageal cancer Neg Hx    Social History  Socioeconomic History   Marital status: Married    Spouse name: Ozell    Number of children: 4   Years of education: some college   Highest education level: Associate degree: occupational, Scientist, product/process development, or vocational program  Occupational History   Occupation: retired Patent examiner  Tobacco Use   Smoking status: Never   Smokeless tobacco: Never  Vaping Use   Vaping status: Never Used  Substance and Sexual Activity   Alcohol use: Never   Drug use: Never   Sexual activity: Not Currently    Birth control/protection: Post-menopausal, Surgical  Other Topics Concern   Not on file  Social History Narrative   06/29/20   From: [place] , lived in [place] some, all her children live in [place] 2021   Living: with husband, Ozell [phone])   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      Family: 4 adult children - [place], [place], [place] and [place] - 16 grandchildren (has 4 adult step children)      Enjoys: work out, spend time with family, hand work, music      Exercise: foot pain limits - typically weight lifts 2 hours a day   Diet: good, not as good since Sales executive belts: Yes    Guns: Yes  and secure   Safe in relationships: Yes    Social Drivers of Corporate investment banker Strain: Low Risk  (01/15/2024)   Overall Financial Resource Strain (CARDIA)    Difficulty of Paying Living Expenses: Not very hard  Food Insecurity: No Food Insecurity (01/15/2024)   Hunger Vital Sign    Worried About Running Out of Food in the Last Year: Never true    Ran Out of Food in the Last Year: Never true  Transportation Needs: No  Transportation Needs (01/15/2024)   PRAPARE - Administrator, Civil Service (Medical): No    Lack of Transportation (Non-Medical): No  Physical Activity: Sufficiently Active (01/15/2024)   Exercise Vital Sign    Days of Exercise per Week: 6 days    Minutes of Exercise per Session: 80 min  Stress: Stress Concern Present (01/15/2024)   [employer] of Occupational Health - Occupational Stress Questionnaire    Feeling of Stress: Very much  Social Connections: Socially Integrated (01/15/2024)   Social Connection and Isolation Panel    Frequency of Communication with Friends and Family: More than three times a week    Frequency of Social Gatherings with Friends and Family: More than three times a week    Attends Religious Services: More than 4 times per year    Active Member of [employer] or Organizations: Yes    Attends Engineer, structural: More than 4 times per year    Marital Status: Married    Tobacco Counseling Counseling given: Not Answered   Clinical Intake:                        Activities of Daily Living    01/15/2024    8:22 AM  In your present state of health, do you have any difficulty performing the following activities:  Hearing? 0  Vision? 0  Difficulty concentrating or making decisions? 0  Walking or climbing stairs? 0  Dressing or bathing? 0  Doing errands, shopping? 0  Preparing Food and eating ? N  Using the Toilet? N  In the past six months, have you  accidently leaked urine? Y  Do you have problems with loss of bowel control? N  Managing your Medications? N  Managing your Finances? N  Housekeeping or managing your Housekeeping? N    Patient Care Team: Sharma Coyer, MD as PCP - General (Family Medicine) Cindie Ole DASEN, MD as PCP - Electrophysiology (Cardiology) Myra Jayson Pin, MD as Referring Physician (Orthopedic Surgery) Nieves Cough, MD as Consulting Physician (Urology) Tobie Lady POUR, MD  as Consulting Physician (Rheumatology) Camella Fallow, MD as Consulting Physician (Orthopedic Surgery) Riddle, Suzann, NP as Nurse Practitioner (Clinical Cardiac Electrophysiology)  Indicate any recent Medical Services you may have received from other than [facility] providers in the past year (date may be approximate).   Physical Exam Vitals reviewed.  Constitutional:      General: She is not in acute distress.    Appearance: Normal appearance. She is not ill-appearing.  Cardiovascular:     Rate and Rhythm: Normal rate and regular rhythm.  Pulmonary:     Effort: Pulmonary effort is normal. No respiratory distress.     Breath sounds: No wheezing, rhonchi or rales.  Neurological:     Mental Status: She is alert and oriented to person, place, and time.  Psychiatric:        Mood and Affect: Mood normal.        Behavior: Behavior normal.        Assessment:   This is a routine wellness examination for Kim Pennington.  Hearing/Vision screen No results found.   Goals Addressed   None    Depression Screen    01/15/2024    1:51 PM 11/15/2023   10:17 AM 03/29/2023    1:51 PM 02/06/2023    1:17 PM 12/20/2022   10:41 AM 11/09/2022    2:37 PM 09/21/2022   11:53 AM  PHQ 2/9 Scores  PHQ - 2 Score 1 0 0 0 0 0 0  PHQ- 9 Score 6 2 0 1 0 0 1    Fall Risk    01/15/2024    8:22 AM 03/29/2023    1:51 PM 02/06/2023    1:17 PM 12/20/2022   10:43 AM 12/16/2022   10:36 PM  Fall Risk   Falls in the past year? 0 0 0 0 0  Number falls in past yr:  0 0 0   Injury with Fall?  0 0 0   Risk for fall due to :  No Fall Risks No Fall Risks No Fall Risks   Follow up   Falls evaluation completed Falls prevention discussed;Falls evaluation completed     MEDICARE RISK AT HOME: Medicare Risk at Home Any stairs in or around the home?: (Patient-Rptd) Yes If so, are there any without handrails?: (Patient-Rptd) No Home free of loose throw rugs in walkways, pet beds, electrical cords, etc?: (Patient-Rptd)  Yes Adequate lighting in your home to reduce risk of falls?: (Patient-Rptd) Yes Life alert?: (Patient-Rptd) No Use of a cane, walker or w/c?: (Patient-Rptd) No Grab bars in the bathroom?: (Patient-Rptd) Yes Shower chair or bench in shower?: (Patient-Rptd) Yes Elevated toilet seat or a handicapped toilet?: (Patient-Rptd) No  TIMED UP AND GO:  Was the test performed?  No    Cognitive Function:        12/20/2022   10:46 AM  6CIT Screen  What Year? 0 points  What month? 0 points  What time? 0 points  Count back from 20 0 points  Months in reverse 4 points  Repeat phrase 2 points  Total Score 6 points    Immunizations Immunization History  Administered Date(s) Administered   Fluad Quad(high Dose 65+) 03/22/2022   Influenza, High Dose Seasonal PF 03/06/2023   Influenza,inj,Quad PF,6+ Mos 02/01/2021   Moderna Covid-19 Fall Seasonal Vaccine 9yrs & older 03/22/2022, 03/06/2023   PFIZER(Purple Top)SARS-COV-2 Vaccination 08/13/2019, 09/03/2019, 06/14/2020   PNEUMOCOCCAL CONJUGATE-20 05/19/2022   Zoster Recombinant(Shingrix) 06/13/2022    TDAP status: Due, Education has been provided regarding the importance of this vaccine. Advised may receive this vaccine at local pharmacy or Health Dept. Aware to provide a copy of the vaccination record if obtained from local pharmacy or Health Dept. Verbalized acceptance and understanding.  Flu Vaccine status: Due, Education has been provided regarding the importance of this vaccine. Advised may receive this vaccine at local pharmacy or Health Dept. Aware to provide a copy of the vaccination record if obtained from local pharmacy or Health Dept. Verbalized acceptance and understanding.  Pneumococcal vaccine status: Up to date  Covid-19 vaccine status: Information provided on how to obtain vaccines.   Qualifies for Shingles Vaccine? Yes   Zostavax completed No   Shingrix Completed?: No.    Education has been provided regarding the importance  of this vaccine. Patient has been advised to call insurance company to determine out of pocket expense if they have not yet received this vaccine. Advised may also receive vaccine at local pharmacy or Health Dept. Verbalized acceptance and understanding.  Screening Tests Health Maintenance  Topic Date Due   DTaP/Tdap/Td (1 - Tdap) Never done   Zoster Vaccines- Shingrix (2 of 2) 08/08/2022   COVID-19 Vaccine (6 - Pfizer risk 2024-25 season) 09/04/2023   Medicare Annual Wellness (AWV)  12/20/2023   INFLUENZA VACCINE  01/04/2024   MAMMOGRAM  05/30/2024   Colonoscopy  10/01/2031   Pneumococcal Vaccine: 50+ Years  Completed   DEXA SCAN  Completed   Hepatitis C Screening  Completed   Hepatitis B Vaccines  Aged Out   HPV VACCINES  Aged Out   Meningococcal B Vaccine  Aged Out    Health Maintenance  Health Maintenance Due  Topic Date Due   DTaP/Tdap/Td (1 - Tdap) Never done   Zoster Vaccines- Shingrix (2 of 2) 08/08/2022   COVID-19 Vaccine (6 - Pfizer risk 2024-25 season) 09/04/2023   Medicare Annual Wellness (AWV)  12/20/2023   INFLUENZA VACCINE  01/04/2024    Colorectal cancer screening: Type of screening: Colonoscopy. Completed 2023. Repeat every 10 years  Mammogram status: Completed 05/2024. Repeat every year  Bone Density status: Completed Dec 2024. Results reflect: Bone density results: OSTEOPOROSIS. Repeat every 2 years.  Lung Cancer Screening: (Low Dose CT Chest recommended if Age 11-80 years, 20 pack-year currently smoking OR have quit w/in 15years.) does not qualify.   Lung Cancer Screening Referral: n/a  Additional Screening:  Hepatitis C Screening: does qualify; Completed 09/2022  Vision Screening: Recommended annual ophthalmology exams for early detection of glaucoma and other disorders of the eye. Is the patient up to date with their annual eye exam?  Yes     Dental Screening: Recommended annual dental exams for proper oral hygiene   Community Resource  Referral / Chronic Care Management: CRR required this visit?  No   CCM required this visit?  No     Plan:     I have personally reviewed and noted the following in the patient's chart:   Medical and social history Use of alcohol, tobacco or illicit drugs  Current medications and supplements including opioid  prescriptions. Patient is not currently taking opioid prescriptions. Functional ability and status Nutritional status Physical activity Advanced directives List of other physicians Hospitalizations, surgeries, and ER visits in previous 12 months Vitals Screenings to include cognitive, depression, and falls Referrals and appointments  In addition, I have reviewed and discussed with patient certain preventive protocols, quality metrics, and best practice recommendations. A written personalized care plan for preventive services as well as general preventive health recommendations were provided to patient.   Assessment and Plan Assessment & Plan Adult Wellness Visit Routine wellness visit with emphasis on vaccinations and screenings for preventive care. She has received two doses of Shingrix and is up to date on COVID vaccinations. Tetanus vaccine is overdue, last received over ten years ago. - Recommend shingles vaccine at the pharmacy - Recommend COVID booster - Recommend flu vaccine for the season - Recommend updated tetanus vaccine at the pharmacy - Order metabolic panel, CBC, and diabetes screening   Hypertension Chronic Blood pressure is elevated at 155/78, likely related to stress from caregiving responsibilities. She previously used antihypertensive medication approximately ten years ago. Increased stress due to caregiving for her husband post-surgery may contribute to elevated blood pressure. - Recheck blood pressure in two weeks - recommend resuming minipress  nightly     Rockie Agent, MD   01/15/2024

## 2024-01-16 ENCOUNTER — Encounter: Payer: Self-pay | Admitting: Gastroenterology

## 2024-01-16 ENCOUNTER — Ambulatory Visit: Admitting: Gastroenterology

## 2024-01-16 VITALS — BP 142/76 | HR 85 | Ht 62.0 in | Wt 138.0 lb

## 2024-01-16 DIAGNOSIS — R1310 Dysphagia, unspecified: Secondary | ICD-10-CM | POA: Diagnosis not present

## 2024-01-16 DIAGNOSIS — K449 Diaphragmatic hernia without obstruction or gangrene: Secondary | ICD-10-CM | POA: Diagnosis not present

## 2024-01-16 DIAGNOSIS — K219 Gastro-esophageal reflux disease without esophagitis: Secondary | ICD-10-CM

## 2024-01-16 DIAGNOSIS — R12 Heartburn: Secondary | ICD-10-CM

## 2024-01-16 LAB — CMP14+EGFR
ALT: 24 IU/L (ref 0–32)
AST: 22 IU/L (ref 0–40)
Albumin: 4.7 g/dL (ref 3.9–4.9)
Alkaline Phosphatase: 56 IU/L (ref 44–121)
BUN/Creatinine Ratio: 22 (ref 12–28)
BUN: 22 mg/dL (ref 8–27)
Bilirubin Total: 0.3 mg/dL (ref 0.0–1.2)
CO2: 20 mmol/L (ref 20–29)
Calcium: 10 mg/dL (ref 8.7–10.3)
Chloride: 99 mmol/L (ref 96–106)
Creatinine, Ser: 1.01 mg/dL — ABNORMAL HIGH (ref 0.57–1.00)
Globulin, Total: 2.3 g/dL (ref 1.5–4.5)
Glucose: 101 mg/dL — ABNORMAL HIGH (ref 70–99)
Potassium: 4.7 mmol/L (ref 3.5–5.2)
Sodium: 137 mmol/L (ref 134–144)
Total Protein: 7 g/dL (ref 6.0–8.5)
eGFR: 61 mL/min/1.73 (ref >59)

## 2024-01-16 LAB — CBC
Hematocrit: 41.3 % (ref 34.0–46.6)
Hemoglobin: 13.5 g/dL (ref 11.1–15.9)
MCH: 30.9 pg (ref 26.6–33.0)
MCHC: 32.7 g/dL (ref 31.5–35.7)
MCV: 95 fL (ref 79–97)
Platelets: 226 x10E3/uL (ref 150–450)
RBC: 4.37 x10E6/uL (ref 3.77–5.28)
RDW: 12.1 % (ref 11.7–15.4)
WBC: 5.6 x10E3/uL (ref 3.4–10.8)

## 2024-01-16 LAB — LIPID PANEL
Chol/HDL Ratio: 2.2 ratio (ref 0.0–4.4)
Cholesterol, Total: 213 mg/dL — ABNORMAL HIGH (ref 100–199)
HDL: 98 mg/dL (ref >39)
LDL Chol Calc (NIH): 106 mg/dL — ABNORMAL HIGH (ref 0–99)
Triglycerides: 47 mg/dL (ref 0–149)
VLDL Cholesterol Cal: 9 mg/dL (ref 5–40)

## 2024-01-16 LAB — HEMOGLOBIN A1C
Est. average glucose Bld gHb Est-mCnc: 111 mg/dL
Hgb A1c MFr Bld: 5.5 % (ref 4.8–5.6)

## 2024-01-16 NOTE — Progress Notes (Signed)
 Kim Pennington    [id number]    1956/05/30  Primary Care Physician:Simmons-Robinson, Rockie, MD  Referring Physician: Hope Merle, MD No address on file   Chief complaint:  GERD  Discussed the use of AI scribe software for clinical note transcription with the patient, who gave verbal consent to proceed.  History of Present Illness Kim Pennington is a 68 year old female with stage three renal failure who presents with concerns about a hiatal hernia and reflux symptoms. She was referred by her primary care doctor for evaluation of a hiatal hernia.  Gastroesophageal reflux symptoms - Heartburn occurs several times per week - Symptoms are managed by drinking at least a gallon of water daily and avoiding lying down, which worsens symptoms - No use of over-the-counter or prescription medications for reflux - No significant stomach pain - No current use of antacids; previously used nightly until dietary changes two years ago - Spicy foods can slightly exacerbate symptoms  Dysphagia and throat sensations - Experiences a 'funky sensation' in the throat when swallowing, described as a tight feeling and slow passage of food - Symptoms resolve with water intake  Hiatal hernia - Hiatal hernia identified on imaging performed for stage three renal failure - Mother died at age 72 from complications related to a large hiatal hernia, increasing concern about her own condition  Dietary modifications - Maintains a clean diet, avoiding bread and carbohydrates - Consumes limited amounts of meat and vegetables  Bowel habits - Daily bowel movements - No blood in stool  Functional status - Very active lifestyle - Works as a Charity fundraiser - Sole caregiver for husband with chronic heart failure  Colonoscopy 09/30/2021 - The examined portion of the ileum was normal. - Normal mucosa in the entire examined colon. Biopsied. - The examination was otherwise normal on direct  and retroflexion views.  Surgical [P], colon nos, random sites - BENIGN COLONIC MUCOSA WITH DENUDED EPITHELIUM AND EDEMATOUS CHANGES - NO ACTIVE INFLAMMATION OR EVIDENCE OF MICROSCOPIC COLITIS - NO HIGH-GRADE DYSPLASIA OR MALIGNANCY IDENTIFIED  Outpatient Encounter Medications as of 01/16/2024  Medication Sig   alendronate  (FOSAMAX ) 70 MG tablet Take 1 tablet (70 mg total) by mouth once a week.   ALPRAZolam  (XANAX ) 0.5 MG tablet Take 1 tablet (0.5 mg total) by mouth as needed for anxiety.   Calcium  Carbonate-Vit D-Min (CALCIUM  1200 PO) Take 1 tablet by mouth daily.   CRANBERRY PO Take 1 tablet by mouth daily.   HUMIRA, 2 PEN, 40 MG/0.4ML pen    prazosin  (MINIPRESS ) 1 MG capsule Take 1 capsule (1 mg total) by mouth at bedtime.   tamsulosin  (FLOMAX ) 0.4 MG CAPS capsule Take 0.4 mg by mouth at bedtime.   valACYclovir  (VALTREX ) 1000 MG tablet Take 1 tablet (1,000 mg total) by mouth 2 (two) times daily as needed.   No facility-administered encounter medications on file as of 01/16/2024.    Allergies as of 01/16/2024   (No Known Allergies)    Past Medical History:  Diagnosis Date   Acquired solitary kidney    right nephrectomy   Anemia    Anxiety    Arthritis    C. difficile diarrhea    Depression    Hepatitis B    Hyperlipidemia    Hypertension    Kidney disease, chronic, stage III (GFR 30-59 ml/min) (HCC)    Kidney stones    Lymphocytic colitis    Memory loss    Osteoporosis  Pacemaker    Renal failure, chronic, stage 3 (moderate) (HCC) 2015   Left kidney    Skin cancer     Past Surgical History:  Procedure Laterality Date   ABDOMINAL HYSTERECTOMY  11/2006   ABDOMINOPLASTY     BREAST LUMPECTOMY Right 1979   CARDIAC CATHETERIZATION  04/24/2014   CARPAL TUNNEL RELEASE Bilateral 03/29/2009   CESAREAN SECTION     x4   COLONOSCOPY     CYSTOSCOPY WITH RETROGRADE PYELOGRAM, URETEROSCOPY AND STENT PLACEMENT Left 03/12/2023   Procedure: CYSTOSCOPY WITH RETROGRADE  PYELOGRAM, URETEROSCOPY AND STENT PLACEMENT;  Surgeon: Shona Layman BROCKS, MD;  Location: [facility];  Service: Urology;  Laterality: Left;   FOOT SURGERY Bilateral    HIP ARTHROSCOPY Right 09/14/2016   reduction of lesser trochanter    HIP ARTHROSCOPY Left 04/25/2016   w/ femoroplasty   left thumb tendon  04/25/2016   LIPOSUCTION     LUMBAR LAMINECTOMY  11/23/2019   foraminotomy, discectomy, facectomy   NEPHRECTOMY Right 2009   PACEMAKER IMPLANT  04/24/2014   RHINOPLASTY     x 2, 1999, 2003   ROTATOR CUFF REPAIR Right    SHOULDER ARTHROSCOPY Right 12/30/2018   x2   TONSILLECTOMY  1961   UMBILICAL HERNIA REPAIR  1998   UPPER GASTROINTESTINAL ENDOSCOPY     WEIL OSTEOTOMY Bilateral 11/27/2019    Family History  Problem Relation Age of Onset   Diabetes Mother    Parkinson's disease Mother    Alzheimer's disease Mother    Heart disease Father    Alzheimer's disease Father    Hypertension Father    CVA Father    Rheum arthritis Father    Stomach cancer Sister    Hyperlipidemia Sister    Hypertension Sister    Diabetes Sister    Liver disease Sister    Drug abuse Sister    Obesity Sister    Hyperlipidemia Sister    Hypertension Sister    Diabetes Sister    Heart disease Sister    Obesity Sister    Diabetes Sister    Breast cancer Sister 30       x2   Hyperlipidemia Sister    Hypertension Sister    Colon polyps Sister    Obesity Sister    Hyperlipidemia Sister    Hypertension Sister    Obesity Sister    Hyperlipidemia Brother    Hypertension Brother    Heart disease Brother    Obesity Brother    Diabetes Maternal Aunt    Diabetes Maternal Grandmother    Heart attack Paternal Grandfather    Osteoarthritis Daughter    Obesity Son    Colon cancer Neg Hx    Esophageal cancer Neg Hx     Social History   Socioeconomic History   Marital status: Married    Spouse name: Ozell    Number of children: 4   Years of education: some college   Highest education level:  Associate degree: occupational, Scientist, product/process development, or vocational program  Occupational History   Occupation: retired Patent examiner  Tobacco Use   Smoking status: Never   Smokeless tobacco: Never  Vaping Use   Vaping status: Never Used  Substance and Sexual Activity   Alcohol use: Never   Drug use: Never   Sexual activity: Not Currently    Birth control/protection: Post-menopausal, Surgical  Other Topics Concern   Not on file  Social History Narrative   06/29/20   From: [place] , lived in [place] some, all  her children live in [place] 2021   Living: with husband, Ozell [phone])   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      Family: 4 adult children - Arthea, Gilboa, Ninetta and Viola - 16 grandchildren (has 4 adult step children)      Enjoys: work out, spend time with family, hand work, music      Exercise: foot pain limits - typically weight lifts 2 hours a day   Diet: good, not as good since Sales executive belts: Yes    Guns: Yes  and secure   Safe in relationships: Yes    Social Drivers of Corporate investment banker Strain: Low Risk  (01/15/2024)   Overall Financial Resource Strain (CARDIA)    Difficulty of Paying Living Expenses: Not very hard  Food Insecurity: No Food Insecurity (01/15/2024)   Hunger Vital Sign    Worried About Running Out of Food in the Last Year: Never true    Ran Out of Food in the Last Year: Never true  Transportation Needs: No Transportation Needs (01/15/2024)   PRAPARE - Administrator, Civil Service (Medical): No    Lack of Transportation (Non-Medical): No  Physical Activity: Sufficiently Active (01/15/2024)   Exercise Vital Sign    Days of Exercise per Week: 6 days    Minutes of Exercise per Session: 80 min  Stress: Stress Concern Present (01/15/2024)   [employer] of Occupational Health - Occupational Stress Questionnaire    Feeling of Stress: Very much  Social Connections: Socially Integrated  (01/15/2024)   Social Connection and Isolation Panel    Frequency of Communication with Friends and Family: More than three times a week    Frequency of Social Gatherings with Friends and Family: More than three times a week    Attends Religious Services: More than 4 times per year    Active Member of [employer] or Organizations: Yes    Attends Engineer, structural: More than 4 times per year    Marital Status: Married  Catering manager Violence: Not At Risk (11/15/2023)   Humiliation, Afraid, Rape, and Kick questionnaire    Fear of Current or Ex-Partner: No    Emotionally Abused: No    Physically Abused: No    Sexually Abused: No      Review of systems: All other review of systems negative except as mentioned in the HPI.   Physical Exam: Vitals:   01/16/24 0848  BP: (!) 142/76  Pulse: 85   Body mass index is 25.24 kg/m. Gen:      No acute distress HEENT:  sclera anicteric Abd:      soft, non-tender; no palpable masses, no distension Ext:    No edema Neuro: alert and oriented x 3 Psych: normal mood and affect  Data Reviewed:  Reviewed labs, radiology imaging, old records and pertinent past GI work up     Assessment and Plan Assessment & Plan Hiatal hernia with gastroesophageal reflux symptoms Small hiatal hernia with GERD symptoms, including heartburn several times a week and occasional dysphagia. Symptoms are managed with lifestyle modifications, such as dietary adjustments, avoiding recumbency postprandially, and hydration. No current pharmacological treatment for reflux. No significant  pain necessitating medication. Family history of complications from a large hiatal hernia in her mother raises concern. Upper endoscopy is indicated, as it has been over 12 years since the last procedure, chronic GERD symptoms and  findings of hiatal hernia on imaging.  Will need to exclude erosive esophagitis or any mucosal changes in the esophagus, evaluate for Barrett's  esophagus. - Perform upper endoscopy on August 18th at 8:30 AM. - Advise fasting from midnight before the procedure. - Continue lifestyle modifications to manage reflux symptoms, including avoiding meals 3-4 hours before bedtime, using a wedge pillow to elevate the head during sleep, and limiting caffeinated drinks to before noon.     This visit required 45 minutes of patient care (this includes precharting, chart review, review of results, face-to-face time used for counseling as well as treatment plan and follow-up. The patient was provided an opportunity to ask questions and all were answered. The patient agreed with the plan and demonstrated an understanding of the instructions.  LOIS Wilkie Mcgee , MD    CC: Hope Merle, MD

## 2024-01-16 NOTE — Patient Instructions (Addendum)
 VISIT SUMMARY:  Today, we discussed your concerns about a hiatal hernia and reflux symptoms. We reviewed your current management strategies and planned an upper endoscopy to further evaluate your condition.  YOUR PLAN:  HIATAL HERNIA WITH GASTROESOPHAGEAL REFLUX SYMPTOMS: You have a small hiatal hernia and experience heartburn several times a week, which you manage with lifestyle changes. There is no current need for medication, but we need to perform an upper endoscopy for further evaluation.  -Perform upper endoscopy on August 18th at 8:30 AM.  -Fast from midnight before the procedure.  -Continue lifestyle modifications to manage reflux symptoms, including avoiding meals 3-4 hours before bedtime, using a wedge pillow to elevate the head during sleep, and limiting caffeinated drinks to before noon.  You have been scheduled for an endoscopy. Please follow written instructions given to you at your visit today.  If you use inhalers (even only as needed), please bring them with you on the day of your procedure.  If you take any of the following medications, they will need to be adjusted prior to your procedure:   DO NOT TAKE 7 DAYS PRIOR TO TEST- Trulicity (dulaglutide) Ozempic, Wegovy (semaglutide) Mounjaro (tirzepatide) Bydureon Bcise (exanatide extended release)  DO NOT TAKE 1 DAY PRIOR TO YOUR TEST Rybelsus (semaglutide) Adlyxin (lixisenatide) Victoza (liraglutide) Byetta (exanatide) ___________________________________________________________________________    _______________________________________________________  If your blood pressure at your visit was 140/90 or greater, please contact your primary care physician to follow up on this.  _______________________________________________________  If you are age 25 or older, your body mass index should be between 23-30. Your Body mass index is 25.24 kg/m. If this is out of the aforementioned range listed, please consider  follow up with your Primary Care Provider.  If you are age 27 or younger, your body mass index should be between 19-25. Your Body mass index is 25.24 kg/m. If this is out of the aformentioned range listed, please consider follow up with your Primary Care Provider.   ________________________________________________________  The  GI providers would like to encourage you to use MYCHART to communicate with providers for non-urgent requests or questions.  Due to long hold times on the telephone, sending your provider a message by [facility] may be a faster and more efficient way to get a response.  Please allow 48 business hours for a response.  Please remember that this is for non-urgent requests.  _______________________________________________________  [facility] is using a team-based approach to care.  Your team is made up of your doctor and two to three APPS. Our APPS (Nurse Practitioners and Physician Assistants) work with your physician to ensure care continuity for you. They are fully qualified to address your health concerns and develop a treatment plan. They communicate directly with your gastroenterologist to care for you. Seeing the Advanced Practice Practitioners on your physician's team can help you by facilitating care more promptly, often allowing for earlier appointments, access to diagnostic testing, procedures, and other specialty referrals.    I appreciate the  opportunity to care for you  Thank You   Kavitha Nandigam , MD

## 2024-01-18 ENCOUNTER — Encounter: Payer: Self-pay | Admitting: Family Medicine

## 2024-01-18 ENCOUNTER — Ambulatory Visit: Payer: Self-pay | Admitting: Family Medicine

## 2024-01-18 DIAGNOSIS — E785 Hyperlipidemia, unspecified: Secondary | ICD-10-CM | POA: Insufficient documentation

## 2024-01-18 MED ORDER — ATORVASTATIN CALCIUM 20 MG PO TABS: 20.0000 mg | ORAL_TABLET | Freq: Every day | ORAL | 3 refills | Status: AC

## 2024-01-21 ENCOUNTER — Encounter: Payer: Self-pay | Admitting: Gastroenterology

## 2024-01-21 ENCOUNTER — Ambulatory Visit (AMBULATORY_SURGERY_CENTER): Admitting: Gastroenterology

## 2024-01-21 VITALS — BP 121/72 | HR 66 | Temp 97.5°F | Resp 11 | Ht 62.0 in | Wt 138.0 lb

## 2024-01-21 DIAGNOSIS — K219 Gastro-esophageal reflux disease without esophagitis: Secondary | ICD-10-CM

## 2024-01-21 DIAGNOSIS — M5414 Radiculopathy, thoracic region: Secondary | ICD-10-CM | POA: Diagnosis not present

## 2024-01-21 DIAGNOSIS — R1310 Dysphagia, unspecified: Secondary | ICD-10-CM

## 2024-01-21 DIAGNOSIS — K449 Diaphragmatic hernia without obstruction or gangrene: Secondary | ICD-10-CM

## 2024-01-21 DIAGNOSIS — R12 Heartburn: Secondary | ICD-10-CM

## 2024-01-21 MED ORDER — SODIUM CHLORIDE 0.9 % IV SOLN: 500.0000 mL | Freq: Once | INTRAVENOUS | Status: DC

## 2024-01-21 MED ORDER — PANTOPRAZOLE SODIUM 20 MG PO TBEC: 20.0000 mg | DELAYED_RELEASE_TABLET | Freq: Every day | ORAL | 3 refills | Status: AC

## 2024-01-21 NOTE — Op Note (Signed)
 [facility] Patient Name: Kim Pennington Procedure Date: 01/21/2024 9:20 AM MRN: [id number] Endoscopist: Gustav ALONSO Mcgee , MD, [id number] Age: 68 Referring MD:  Date of Birth: 28-Jul-1955 Gender: Female Account #: [id number] Procedure:                Upper GI endoscopy Indications:              Dysphagia, Heartburn, Suspected esophageal reflux,                            Abnormal chest x-ray, Suspected hiatal hernia Medicines:                Monitored Anesthesia Care Procedure:                Pre-Anesthesia Assessment:                           - Prior to the procedure, a History and Physical                            was performed, and patient medications and                            allergies were reviewed. The patient's tolerance of                            previous anesthesia was also reviewed. The risks                            and benefits of the procedure and the sedation                            options and risks were discussed with the patient.                            All questions were answered, and informed consent                            was obtained. Prior Anticoagulants: The patient has                            taken no anticoagulant or antiplatelet agents. ASA                            Grade Assessment: II - A patient with mild systemic                            disease. After reviewing the risks and benefits,                            the patient was deemed in satisfactory condition to                            undergo the procedure.  After obtaining informed consent, the endoscope was                            passed under direct vision. Throughout the                            procedure, the patient's blood pressure, pulse, and                            oxygen saturations were monitored continuously. The                            GIF [id number] #[id number] was introduced through the                             mouth, and advanced to the second part of duodenum.                            The upper GI endoscopy was accomplished without                            difficulty. The patient tolerated the procedure                            well. Scope In: Scope Out: Findings:                 The Z-line was regular and was found 36 cm from the                            incisors.                           The gastroesophageal flap valve was visualized                            endoscopically and classified as Hill Grade II                            (fold present, opens with respiration).                           A 1 cm sliding hiatal hernia was present.                           The exam of the esophagus was otherwise normal.                           The stomach was normal.                           The cardia and gastric fundus were normal on                            retroflexion.  The examined duodenum was normal. Complications:            No immediate complications. Estimated Blood Loss:     Estimated blood loss was minimal. Impression:               - Z-line regular, 36 cm from the incisors.                           - Gastroesophageal flap valve classified as Hill                            Grade II (fold present, opens with respiration).                           - 1 cm hiatal hernia.                           - Normal stomach.                           - Normal examined duodenum.                           - No specimens collected. Recommendation:           - Patient has a contact number available for                            emergencies. The signs and symptoms of potential                            delayed complications were discussed with the                            patient. Return to normal activities tomorrow.                            Written discharge instructions were provided to the                            patient.                           -  Resume previous diet.                           - Continue present medications.                           - Follow an antireflux regimen.                           - Use Protonix  (pantoprazole ) 20 mg PO daily PRN.                            Rx for 90 days with 3 refills Kim Brickle V. Olukemi Panchal, MD 01/21/2024 10:00:09 AM This report has been signed electronically.

## 2024-01-21 NOTE — Progress Notes (Signed)
 [facility] History and Physical   Primary Care Physician:  Sharma Coyer, MD   Reason for Procedure:  GERD, hiatal hernia, dysphagia  Plan:    EGD with possible interventions as needed     HPI: Kim Pennington is a very pleasant 68 y.o. female here for EGD for evaluation of GERD, hiatal hernia, dysphagia. Please refer to office visit note for additional details  The risks and benefits as well as alternatives of endoscopic procedure(s) have been discussed and reviewed. All questions answered. The patient agrees to proceed.    Past Medical History:  Diagnosis Date   Acquired solitary kidney    right nephrectomy   Anemia    Anxiety    Arthritis    C. difficile diarrhea    Depression    Hepatitis B    Hyperlipidemia    Hypertension    Kidney disease, chronic, stage III (GFR 30-59 ml/min) (HCC)    Kidney stones    Lymphocytic colitis    Memory loss    Osteoporosis    Pacemaker    Renal failure, chronic, stage 3 (moderate) (HCC) 2015   Left kidney    Skin cancer     Past Surgical History:  Procedure Laterality Date   ABDOMINAL HYSTERECTOMY  11/2006   ABDOMINOPLASTY     BREAST LUMPECTOMY Right 1979   CARDIAC CATHETERIZATION  04/24/2014   CARPAL TUNNEL RELEASE Bilateral 03/29/2009   CESAREAN SECTION     x4   COLONOSCOPY     CYSTOSCOPY WITH RETROGRADE PYELOGRAM, URETEROSCOPY AND STENT PLACEMENT Left 03/12/2023   Procedure: CYSTOSCOPY WITH RETROGRADE PYELOGRAM, URETEROSCOPY AND STENT PLACEMENT;  Surgeon: Shona Layman BROCKS, MD;  Location: [facility];  Service: Urology;  Laterality: Left;   FOOT SURGERY Bilateral    HIP ARTHROSCOPY Right 09/14/2016   reduction of lesser trochanter    HIP ARTHROSCOPY Left 04/25/2016   w/ femoroplasty   left thumb tendon  04/25/2016   LIPOSUCTION     LUMBAR LAMINECTOMY  11/23/2019   foraminotomy, discectomy, facectomy   NEPHRECTOMY Right 2009   PACEMAKER IMPLANT  04/24/2014   RHINOPLASTY     x 2, 1999, 2003    ROTATOR CUFF REPAIR Right    SHOULDER ARTHROSCOPY Right 12/30/2018   x2   TONSILLECTOMY  1961   UMBILICAL HERNIA REPAIR  1998   UPPER GASTROINTESTINAL ENDOSCOPY     WEIL OSTEOTOMY Bilateral 11/27/2019    Prior to Admission medications   Medication Sig Start Date End Date Taking? Authorizing Provider  alendronate  (FOSAMAX ) 70 MG tablet Take 1 tablet (70 mg total) by mouth once a week. 11/15/23  Yes Simmons-Robinson, Makiera, MD  ALPRAZolam  (XANAX ) 0.5 MG tablet Take 1 tablet (0.5 mg total) by mouth as needed for anxiety. 11/15/23  Yes Simmons-Robinson, Makiera, MD  atorvastatin  (LIPITOR ) 20 MG tablet Take 1 tablet (20 mg total) by mouth daily. 01/18/24  Yes Simmons-Robinson, Makiera, MD  Calcium  Carbonate-Vit D-Min (CALCIUM  1200 PO) Take 1 tablet by mouth daily.   Yes [provider]  CRANBERRY PO Take 1 tablet by mouth daily.   Yes [provider]  HUMIRA, 2 PEN, 40 MG/0.4ML pen  10/01/23  Yes [provider]  prazosin  (MINIPRESS ) 1 MG capsule Take 1 capsule (1 mg total) by mouth at bedtime. 11/15/23  Yes Simmons-Robinson, Makiera, MD  tamsulosin  (FLOMAX ) 0.4 MG CAPS capsule Take 0.4 mg by mouth at bedtime.   Yes [provider]  valACYclovir  (VALTREX ) 1000 MG tablet Take 1 tablet (1,000 mg total) by  mouth 2 (two) times daily as needed. 11/15/23   Simmons-Robinson, Rockie, MD    Current Outpatient Medications  Medication Sig Dispense Refill   alendronate  (FOSAMAX ) 70 MG tablet Take 1 tablet (70 mg total) by mouth once a week. 12 tablet 3   ALPRAZolam  (XANAX ) 0.5 MG tablet Take 1 tablet (0.5 mg total) by mouth as needed for anxiety. 30 tablet 5   atorvastatin  (LIPITOR ) 20 MG tablet Take 1 tablet (20 mg total) by mouth daily. 90 tablet 3   Calcium  Carbonate-Vit D-Min (CALCIUM  1200 PO) Take 1 tablet by mouth daily.     CRANBERRY PO Take 1 tablet by mouth daily.     HUMIRA, 2 PEN, 40 MG/0.4ML pen      prazosin  (MINIPRESS ) 1 MG capsule Take 1 capsule (1 mg  total) by mouth at bedtime. 90 capsule 3   tamsulosin  (FLOMAX ) 0.4 MG CAPS capsule Take 0.4 mg by mouth at bedtime.     valACYclovir  (VALTREX ) 1000 MG tablet Take 1 tablet (1,000 mg total) by mouth 2 (two) times daily as needed.     Current Facility-Administered Medications  Medication Dose Route Frequency Provider Last Rate Last Admin   0.9 %  sodium chloride  infusion  500 mL Intravenous Once Sameria Morss V, MD        Allergies as of 01/21/2024   (No Known Allergies)    Family History  Problem Relation Age of Onset   Diabetes Mother    Parkinson's disease Mother    Alzheimer's disease Mother    Heart disease Father    Alzheimer's disease Father    Hypertension Father    CVA Father    Rheum arthritis Father    Stomach cancer Sister    Hyperlipidemia Sister    Hypertension Sister    Diabetes Sister    Liver disease Sister    Drug abuse Sister    Obesity Sister    Hyperlipidemia Sister    Hypertension Sister    Diabetes Sister    Heart disease Sister    Obesity Sister    Diabetes Sister    Breast cancer Sister 30       x2   Hyperlipidemia Sister    Hypertension Sister    Colon polyps Sister    Obesity Sister    Hyperlipidemia Sister    Hypertension Sister    Obesity Sister    Hyperlipidemia Brother    Hypertension Brother    Heart disease Brother    Obesity Brother    Diabetes Maternal Aunt    Diabetes Maternal Grandmother    Heart attack Paternal Grandfather    Osteoarthritis Daughter    Obesity Son    Colon cancer Neg Hx    Esophageal cancer Neg Hx     Social History   Socioeconomic History   Marital status: Married    Spouse name: Ozell    Number of children: 4   Years of education: some college   Highest education level: Associate degree: occupational, Scientist, product/process development, or vocational program  Occupational History   Occupation: retired Patent examiner  Tobacco Use   Smoking status: Never   Smokeless tobacco: Never  Vaping Use   Vaping status:  Never Used  Substance and Sexual Activity   Alcohol use: Never   Drug use: Never   Sexual activity: Not Currently    Birth control/protection: Post-menopausal, Surgical  Other Topics Concern   Not on file  Social History Narrative   06/29/20   From: [place] , lived in  [place] some, all her children live in [place] 2021   Living: with husband, Ozell [phone])   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      Family: 4 adult children - Arthea, [place], Ninetta and Magalene - 16 grandchildren (has 4 adult step children)      Enjoys: work out, spend time with family, hand work, music      Exercise: foot pain limits - typically weight lifts 2 hours a day   Diet: good, not as good since Sales executive belts: Yes    Guns: Yes  and secure   Safe in relationships: Yes    Social Drivers of Corporate investment banker Strain: Low Risk  (01/15/2024)   Overall Financial Resource Strain (CARDIA)    Difficulty of Paying Living Expenses: Not very hard  Food Insecurity: No Food Insecurity (01/15/2024)   Hunger Vital Sign    Worried About Running Out of Food in the Last Year: Never true    Ran Out of Food in the Last Year: Never true  Transportation Needs: No Transportation Needs (01/15/2024)   PRAPARE - Administrator, Civil Service (Medical): No    Lack of Transportation (Non-Medical): No  Physical Activity: Sufficiently Active (01/15/2024)   Exercise Vital Sign    Days of Exercise per Week: 6 days    Minutes of Exercise per Session: 80 min  Stress: Stress Concern Present (01/15/2024)   [employer] of Occupational Health - Occupational Stress Questionnaire    Feeling of Stress: Very much  Social Connections: Socially Integrated (01/15/2024)   Social Connection and Isolation Panel    Frequency of Communication with Friends and Family: More than three times a week    Frequency of Social Gatherings with Friends and Family: More than three times a week     Attends Religious Services: More than 4 times per year    Active Member of [employer] or Organizations: Yes    Attends Engineer, structural: More than 4 times per year    Marital Status: Married  Catering manager Violence: Not At Risk (11/15/2023)   Humiliation, Afraid, Rape, and Kick questionnaire    Fear of Current or Ex-Partner: No    Emotionally Abused: No    Physically Abused: No    Sexually Abused: No    Review of Systems:  All other review of systems negative except as mentioned in the HPI.  Physical Exam: Vital signs in last 24 hours: BP 135/77   Pulse 83   Temp (!) 97.5 F (36.4 C) (Skin)   Ht 5' 2 (1.575 m)   Wt 138 lb (62.6 kg)   SpO2 97%   BMI 25.24 kg/m  General:   Alert, NAD Lungs:  Clear .   Heart:  Regular rate and rhythm Abdomen:  Soft, nontender and nondistended. Neuro/Psych:  Alert and cooperative. Normal mood and affect. A and O x 3  Reviewed labs, radiology imaging, old records and pertinent past GI work up  Patient is appropriate for planned procedure(s) and anesthesia in an ambulatory setting   K. Veena Romyn Boswell , MD [phone]

## 2024-01-21 NOTE — Patient Instructions (Addendum)
 Resume regular diet  Continue present medications  Follow antireflux regimen  Use Protonix  (pantoprazole ) 20 mg daily as needed  YOU HAD AN ENDOSCOPIC PROCEDURE TODAY AT THE [facility]:   Refer to the procedure report that was given to you for any specific questions about what was found during the examination.  If the procedure report does not answer your questions, please call your gastroenterologist to clarify.  If you requested that your care partner not be given the details of your procedure findings, then the procedure report has been included in a sealed envelope for you to review at your convenience later.  YOU SHOULD EXPECT: Some feelings of bloating in the abdomen. Passage of more gas than usual.  Walking can help get rid of the air that was put into your GI tract during the procedure and reduce the bloating. If you had a lower endoscopy (such as a colonoscopy or flexible sigmoidoscopy) you may notice spotting of blood in your stool or on the toilet paper. If you underwent a bowel prep for your procedure, you may not have a normal bowel movement for a few days.  Please Note:  You might notice some irritation and congestion in your nose or some drainage.  This is from the oxygen used during your procedure.  There is no need for concern and it should clear up in a day or so.  SYMPTOMS TO REPORT IMMEDIATELY: Following upper endoscopy (EGD)  Vomiting of blood or coffee ground material  New chest pain or pain under the shoulder blades  Painful or persistently difficult swallowing  New shortness of breath  Fever of 100F or higher  Black, tarry-looking stools  For urgent or emergent issues, a gastroenterologist can be reached at any hour by calling [phone]. Do not use MyChart messaging for urgent concerns.   DIET:  We do recommend a small meal at first, but then you may proceed to your regular diet.  Drink plenty of fluids but you should avoid alcoholic beverages for 24  hours.  ACTIVITY:  You should plan to take it easy for the rest of today and you should NOT DRIVE or use heavy machinery until tomorrow (because of the sedation medicines used during the test).    FOLLOW UP: Our staff will call the number listed on your records the next business day following your procedure.  We will call around 7:15- 8:00 am to check on you and address any questions or concerns that you may have regarding the information given to you following your procedure. If we do not reach you, we will leave a message.     If any biopsies were taken you will be contacted by phone or by letter within the next 1-3 weeks.  Please call us  at [phone] if you have not heard about the biopsies in 3 weeks.   SIGNATURES/CONFIDENTIALITY: You and/or your care partner have signed paperwork which will be entered into your electronic medical record.  These signatures attest to the fact that that the information above on your After Visit Summary has been reviewed and is understood.  Full responsibility of the confidentiality of this discharge information lies with you and/or your care-partner.

## 2024-01-21 NOTE — Progress Notes (Signed)
0926 Robinul 0.1 mg IV given due large amount of secretions upon assessment.  MD made aware, vss  °

## 2024-01-21 NOTE — Progress Notes (Signed)
 Report given to PACU, vss

## 2024-01-21 NOTE — Progress Notes (Signed)
 Pt's states no medical or surgical changes since previsit or office visit.

## 2024-01-22 ENCOUNTER — Telehealth: Payer: Self-pay

## 2024-01-22 NOTE — Telephone Encounter (Signed)
  Follow up Call-     01/21/2024    8:36 AM 09/30/2021    1:07 PM  Call back number  Post procedure Call Back phone  # [phone] (husband phone)  Permission to leave phone message Yes Yes   3  Patient questions:  Do you have a fever, pain , or abdominal swelling? No. Pain Score  0 *  Have you tolerated food without any problems? Yes.    Have you been able to return to your normal activities? Yes.    Do you have any questions about your discharge instructions: Diet   No. Medications  No. Follow up visit  No.  Do you have questions or concerns about your Care? No.  Actions: * If pain score is 4 or above: No action needed, pain <4.

## 2024-01-25 DIAGNOSIS — H04123 Dry eye syndrome of bilateral lacrimal glands: Secondary | ICD-10-CM | POA: Diagnosis not present

## 2024-01-25 DIAGNOSIS — H2513 Age-related nuclear cataract, bilateral: Secondary | ICD-10-CM | POA: Diagnosis not present

## 2024-01-30 ENCOUNTER — Telehealth: Payer: Self-pay

## 2024-01-30 NOTE — Telephone Encounter (Signed)
 Mailbox full. Unable to leave voicemail. If call is returned please verify what medication patient is inquiring about so that she is able to be further advised

## 2024-01-30 NOTE — Telephone Encounter (Signed)
 Copied from CRM [id number]. Topic: Clinical - Medication Question >> Jan 30, 2024 10:22 AM Dawna HERO wrote: Reason for CRM: patient wants to know if she should only be taking medication for 1 month since she has no refills or should she keep taking it. Asked for a call back or message.

## 2024-02-01 NOTE — Telephone Encounter (Signed)
 Called patient to verify which medication. She is referring to cholesterol medication that was just prescribed. Advised patient that provider sent in a 3 month supply with 3 additional refills. Patient verbalized understanding.

## 2024-02-12 DIAGNOSIS — M5414 Radiculopathy, thoracic region: Secondary | ICD-10-CM | POA: Diagnosis not present

## 2024-02-13 ENCOUNTER — Ambulatory Visit: Payer: Self-pay

## 2024-02-13 DIAGNOSIS — I495 Sick sinus syndrome: Secondary | ICD-10-CM

## 2024-02-14 LAB — CUP PACEART REMOTE DEVICE CHECK
Battery Remaining Longevity: 26 mo
Battery Remaining Percentage: 20 %
Battery Voltage: 2.9 V
Brady Statistic AP VP Percent: 1 %
Brady Statistic AP VS Percent: 14 %
Brady Statistic AS VP Percent: 1 %
Brady Statistic AS VS Percent: 85 %
Brady Statistic RA Percent Paced: 14 %
Brady Statistic RV Percent Paced: 1 %
Date Time Interrogation Session: 20250910024528
Implantable Lead Connection Status: 753985
Implantable Lead Connection Status: 753985
Implantable Lead Implant Date: 20151120
Implantable Lead Implant Date: 20151120
Implantable Lead Location: 753862
Implantable Lead Location: 753862
Implantable Pulse Generator Implant Date: 20151120
Lead Channel Impedance Value: 430 Ohm
Lead Channel Impedance Value: 600 Ohm
Lead Channel Pacing Threshold Amplitude: 0.5 V
Lead Channel Pacing Threshold Amplitude: 0.75 V
Lead Channel Pacing Threshold Pulse Width: 0.5 ms
Lead Channel Pacing Threshold Pulse Width: 0.5 ms
Lead Channel Sensing Intrinsic Amplitude: 1.1 mV
Lead Channel Sensing Intrinsic Amplitude: 6.3 mV
Lead Channel Setting Pacing Amplitude: 2 V
Lead Channel Setting Pacing Amplitude: 2 V
Lead Channel Setting Pacing Pulse Width: 0.5 ms
Lead Channel Setting Sensing Sensitivity: 0.5 mV
Pulse Gen Model: 2240
Pulse Gen Serial Number: 7689719

## 2024-02-15 NOTE — Progress Notes (Signed)
 Remote PPM Transmission

## 2024-02-17 ENCOUNTER — Ambulatory Visit: Payer: Self-pay | Admitting: Cardiology

## 2024-02-18 DIAGNOSIS — K080 Exfoliation of teeth due to systemic causes: Secondary | ICD-10-CM | POA: Diagnosis not present

## 2024-03-03 ENCOUNTER — Ambulatory Visit: Admitting: Family Medicine

## 2024-03-03 ENCOUNTER — Encounter: Payer: Self-pay | Admitting: Family Medicine

## 2024-03-03 VITALS — BP 143/79 | HR 67 | Resp 16 | Ht 63.0 in | Wt 130.0 lb

## 2024-03-03 DIAGNOSIS — M818 Other osteoporosis without current pathological fracture: Secondary | ICD-10-CM

## 2024-03-03 DIAGNOSIS — I10 Essential (primary) hypertension: Secondary | ICD-10-CM

## 2024-03-03 DIAGNOSIS — R55 Syncope and collapse: Secondary | ICD-10-CM

## 2024-03-03 DIAGNOSIS — E785 Hyperlipidemia, unspecified: Secondary | ICD-10-CM

## 2024-03-03 DIAGNOSIS — K219 Gastro-esophageal reflux disease without esophagitis: Secondary | ICD-10-CM | POA: Diagnosis not present

## 2024-03-03 DIAGNOSIS — F5101 Primary insomnia: Secondary | ICD-10-CM | POA: Diagnosis not present

## 2024-03-03 DIAGNOSIS — Z23 Encounter for immunization: Secondary | ICD-10-CM | POA: Diagnosis not present

## 2024-03-03 MED ORDER — HYDROCHLOROTHIAZIDE 25 MG PO TABS: 12.5000 mg | ORAL_TABLET | Freq: Every day | ORAL | 2 refills | Status: DC

## 2024-03-03 NOTE — Assessment & Plan Note (Signed)
 Hyperlipidemia Chronic  Hyperlipidemia managed with Lipitor  20 mg daily. Last lipid panel showed total cholesterol of 213 mg/dL and LDL of 893 mg/dL. - Continue Lipitor  20 mg daily. - Plan to recheck lipid panel in 2-3 months

## 2024-03-03 NOTE — Progress Notes (Signed)
 Established patient visit   Patient: Kim Pennington   DOB: 1955/09/07   68 y.o. Female  MRN: [id number] Visit Date: 03/03/2024  Today's healthcare provider: Rockie Agent, MD   Chief Complaint  Patient presents with  . Follow-up    F/u on HTN   Subjective     HPI     Follow-up    Additional comments: F/u on HTN      Last edited by Marylen Odella CROME, CMA on 03/03/2024  1:03 PM.       Discussed the use of AI scribe software for clinical note transcription with the patient, who gave verbal consent to proceed.  History of Present Illness Kim Pennington is a 68 year old female with hypertension and diastolic heart failure who presents for blood pressure management.  She has consistently high blood pressure readings at home, with values sometimes reaching 160 mmHg and morning readings around 143 mmHg. She experiences palpitations, describing them as a sensation of her heart being in her throat. She is currently taking Minipress  1 mg at night for blood pressure management.  Her medical history includes diastolic heart failure and chronic kidney disease stage 3A, with a last recorded GFR of 58. She has a pacemaker, which was placed after experiencing syncope episodes that resulted in her losing her job. Recently, she experienced two syncope episodes last week and is concerned about the impact on her ability to drive.  She has hyperlipidemia and is on Lipitor  20 mg daily. Her last lipid panel showed a total cholesterol of 213 mg/dL and LDL of 893 mg/dL. She also has osteoporosis, for which she takes Fosamax  70 mg weekly and calcium  1200 mg daily. She is on hormone replacement therapy for postmenopausal estrogen deficiency and continues Protonix  20 mg daily for GERD.  She has rheumatoid arthritis and is under the care of a rheumatologist, taking Humira 40 mg. She feels over-anxious and mentions that alprazolam  helps her relax enough to sleep, although her sleep is not as  good as she would like. She drinks over a gallon of water daily due to having one remaining kidney in stage 3 renal failure.     Past Medical History:  Diagnosis Date  . Acquired solitary kidney    right nephrectomy  . Anemia   . Anxiety   . Arthritis   . C. difficile diarrhea   . Depression   . Hepatitis B   . Hyperlipidemia   . Hypertension   . Kidney disease, chronic, stage III (GFR 30-59 ml/min) (HCC)   . Kidney stones   . Lymphocytic colitis   . Memory loss   . Osteoporosis   . Pacemaker   . Renal failure, chronic, stage 3 (moderate) (HCC) 2015   Left kidney   . Skin cancer     Medications: Outpatient Medications Prior to Visit  Medication Sig  . alendronate  (FOSAMAX ) 70 MG tablet Take 1 tablet (70 mg total) by mouth once a week.  . ALPRAZolam  (XANAX ) 0.5 MG tablet Take 1 tablet (0.5 mg total) by mouth as needed for anxiety.  . atorvastatin  (LIPITOR ) 20 MG tablet Take 1 tablet (20 mg total) by mouth daily.  . Calcium  Carbonate-Vit D-Min (CALCIUM  1200 PO) Take 1 tablet by mouth daily.  SABRA CRANBERRY PO Take 1 tablet by mouth daily.  SABRA HUMIRA, 2 PEN, 40 MG/0.4ML pen   . pantoprazole  (PROTONIX ) 20 MG tablet Take 1 tablet (20 mg total) by mouth daily.  . prazosin  (MINIPRESS ) 1  MG capsule Take 1 capsule (1 mg total) by mouth at bedtime.  . tamsulosin  (FLOMAX ) 0.4 MG CAPS capsule Take 0.4 mg by mouth at bedtime.  . valACYclovir  (VALTREX ) 1000 MG tablet Take 1 tablet (1,000 mg total) by mouth 2 (two) times daily as needed.   No facility-administered medications prior to visit.    Review of Systems  Last metabolic panel Lab Results  Component Value Date   GLUCOSE 101 (H) 01/15/2024   NA 137 01/15/2024   K 4.7 01/15/2024   CL 99 01/15/2024   CO2 20 01/15/2024   BUN 22 01/15/2024   CREATININE 1.01 (H) 01/15/2024   EGFR 61 01/15/2024   CALCIUM  10.0 01/15/2024   PROT 7.0 01/15/2024   ALBUMIN 4.7 01/15/2024   LABGLOB 2.3 01/15/2024   BILITOT 0.3 01/15/2024    ALKPHOS 56 01/15/2024   AST 22 01/15/2024   ALT 24 01/15/2024   ANIONGAP 8 03/12/2023   Last lipids Lab Results  Component Value Date   CHOL 213 (H) 01/15/2024   HDL 98 01/15/2024   LDLCALC 106 (H) 01/15/2024   TRIG 47 01/15/2024   CHOLHDL 2.2 01/15/2024    Last hemoglobin A1c Lab Results  Component Value Date   HGBA1C 5.5 01/15/2024   Last thyroid  functions Lab Results  Component Value Date   TSH 0.90 09/27/2022        Objective    BP (!) 143/79 (BP Location: Left Arm, Patient Position: Sitting, Cuff Size: Normal)   Pulse 67   Resp 16   Ht 5' 3 (1.6 m)   Wt 130 lb (59 kg)   SpO2 100%   BMI 23.03 kg/m   BP Readings from Last 3 Encounters:  03/03/24 (!) 143/79  01/21/24 121/72  01/16/24 (!) 142/76   Wt Readings from Last 3 Encounters:  03/03/24 130 lb (59 kg)  01/21/24 138 lb (62.6 kg)  01/16/24 138 lb (62.6 kg)        Physical Exam Vitals reviewed.  Constitutional:      General: She is not in acute distress.    Appearance: Normal appearance. She is not ill-appearing.  Cardiovascular:     Rate and Rhythm: Normal rate and regular rhythm.  Pulmonary:     Effort: Pulmonary effort is normal. No respiratory distress.     Breath sounds: No wheezing, rhonchi or rales.  Neurological:     Mental Status: She is alert and oriented to person, place, and time.  Psychiatric:        Mood and Affect: Mood normal.        Behavior: Behavior normal.       No results found for any visits on 03/03/24.  Assessment & Plan     Problem List Items Addressed This Visit     Gastroesophageal reflux disease   Gastroesophageal reflux disease (GERD) Chronic  GERD managed with Protonix  20 mg daily. - Continue Protonix  20 mg daily.      Hyperlipidemia LDL goal <70   Hyperlipidemia Chronic  Hyperlipidemia managed with Lipitor  20 mg daily. Last lipid panel showed total cholesterol of 213 mg/dL and LDL of 893 mg/dL. - Continue Lipitor  20 mg daily. - Plan to recheck  lipid panel in 2-3 months      Relevant Medications   hydrochlorothiazide (HYDRODIURIL) 25 MG tablet   Hypertension - Primary   Hypertension Chronic  Hypertension remains uncontrolled with home readings reaching 160 mmHg. Current management with Minipress  1 mg at night is insufficient. Previous medications like amlodipine   and losartan  were discontinued due to side effects and syncope. Considering syncope and CKD, a cautious approach is needed. - Initiate hydrochlorothiazide 12.5 mg daily, monitor blood pressure closely. - Monitor potassium levels due to potential hypokalemia from hydrochlorothiazide. - Schedule follow-up in two weeks for lab work to check potassium and sodium levels. - Advise to monitor blood pressure at home and report any episodes of syncope or significant changes.      Relevant Medications   hydrochlorothiazide (HYDRODIURIL) 25 MG tablet   Other Relevant Orders   CMP14+EGFR   Insomnia   Insomnia, chronic  Insomnia exacerbated by stress related to husband's health issues. Uses alprazolam  as needed for sleep, preferring to continue due to current stressors. - Continue alprazolam  as needed for insomnia.       Osteoporosis   Chronic osteoporosis managed with Fosamax  and calcium  supplementation. - Continue Fosamax  70 mg once weekly. - Continue calcium  1200 mg daily and vitamin D  supplementation.      Syncope   Syncope Recent episodes of syncope with a pacemaker in place. Concern about syncope related to blood pressure management. - Monitor for syncope episodes, especially with new antihypertensive therapy. - Advise to stop hydrochlorothiazide if syncope or significant hypotension occurs. - Follow up with cardiology as needed, especially regarding pacemaker management.       Assessment and Plan Assessment & Plan   Chronic kidney disease stage 3a, status post right nephrectomy CKD stage 3a with a GFR of 58. Under the care of a urologist. Hydration is  maintained with over a gallon of water daily. - Monitor kidney function and electrolytes, especially potassium, due to new antihypertensive therapy. - BMP ordered  - Consider nephrology consultation if needed for further management.   Anxiety  Insomnia  Chronic  Anxiety related to caregiving responsibilities and health concerns. Alprazolam  helps with sleep but is not used during the day. - Continue current management with alprazolam  0.5mg  daily as needed for sleep.  Influenza vaccine was administered today  Patient tolerated well    Return in about 8 weeks (around 04/28/2024) for HTN.       Rockie Agent, MD  [facility] [phone] (phone) [phone] (fax)  [facility]

## 2024-03-03 NOTE — Assessment & Plan Note (Signed)
 Chronic osteoporosis managed with Fosamax  and calcium  supplementation. - Continue Fosamax  70 mg once weekly. - Continue calcium  1200 mg daily and vitamin D  supplementation.

## 2024-03-03 NOTE — Assessment & Plan Note (Signed)
 Insomnia, chronic  Insomnia exacerbated by stress related to husband's health issues. Uses alprazolam  as needed for sleep, preferring to continue due to current stressors. - Continue alprazolam  as needed for insomnia.

## 2024-03-03 NOTE — Assessment & Plan Note (Signed)
 Syncope Recent episodes of syncope with a pacemaker in place. Concern about syncope related to blood pressure management. - Monitor for syncope episodes, especially with new antihypertensive therapy. - Advise to stop hydrochlorothiazide if syncope or significant hypotension occurs. - Follow up with cardiology as needed, especially regarding pacemaker management.

## 2024-03-03 NOTE — Assessment & Plan Note (Signed)
 Hypertension Chronic  Hypertension remains uncontrolled with home readings reaching 160 mmHg. Current management with Minipress  1 mg at night is insufficient. Previous medications like amlodipine  and losartan  were discontinued due to side effects and syncope. Considering syncope and CKD, a cautious approach is needed. - Initiate hydrochlorothiazide 12.5 mg daily, monitor blood pressure closely. - Monitor potassium levels due to potential hypokalemia from hydrochlorothiazide. - Schedule follow-up in two weeks for lab work to check potassium and sodium levels. - Advise to monitor blood pressure at home and report any episodes of syncope or significant changes.

## 2024-03-03 NOTE — Patient Instructions (Addendum)
  Please visit the lab for blood work. You can visit the lab without an appointment Mon-Fri between 8A-11:30A or 1P-4:30P.       To keep you healthy, please keep in mind the following health maintenance items that you are due for:   Health Maintenance Due  Topic Date Due   DTaP/Tdap/Td (1 - Tdap) Never done   Zoster Vaccines- Shingrix (2 of 2) 08/08/2022   Influenza Vaccine  01/04/2024   COVID-19 Vaccine (6 - Pfizer risk 2024-25 season) 02/04/2024     Best Wishes,   Dr. Lang

## 2024-03-03 NOTE — Assessment & Plan Note (Signed)
 Gastroesophageal reflux disease (GERD) Chronic  GERD managed with Protonix  20 mg daily. - Continue Protonix  20 mg daily.

## 2024-03-04 DIAGNOSIS — M0609 Rheumatoid arthritis without rheumatoid factor, multiple sites: Secondary | ICD-10-CM | POA: Diagnosis not present

## 2024-03-04 DIAGNOSIS — Z7960 Long term (current) use of unspecified immunomodulators and immunosuppressants: Secondary | ICD-10-CM | POA: Diagnosis not present

## 2024-03-06 ENCOUNTER — Ambulatory Visit: Admitting: Physician Assistant

## 2024-03-06 ENCOUNTER — Encounter: Payer: Self-pay | Admitting: Physician Assistant

## 2024-03-06 ENCOUNTER — Ambulatory Visit: Payer: Self-pay

## 2024-03-06 VITALS — BP 139/88 | HR 61 | Ht 63.0 in | Wt 139.0 lb

## 2024-03-06 DIAGNOSIS — R079 Chest pain, unspecified: Secondary | ICD-10-CM | POA: Diagnosis not present

## 2024-03-06 DIAGNOSIS — R202 Paresthesia of skin: Secondary | ICD-10-CM | POA: Diagnosis not present

## 2024-03-06 DIAGNOSIS — Z950 Presence of cardiac pacemaker: Secondary | ICD-10-CM | POA: Diagnosis not present

## 2024-03-06 DIAGNOSIS — R55 Syncope and collapse: Secondary | ICD-10-CM

## 2024-03-06 DIAGNOSIS — N183 Chronic kidney disease, stage 3 unspecified: Secondary | ICD-10-CM

## 2024-03-06 DIAGNOSIS — R42 Dizziness and giddiness: Secondary | ICD-10-CM | POA: Diagnosis not present

## 2024-03-06 NOTE — Telephone Encounter (Signed)
 FYI Only or Action Required?: FYI only for provider.  Patient was last seen in primary care on 03/03/2024 by Sharma Coyer, MD.  Called Nurse Triage reporting Dizziness and Medication Reaction.  Symptoms began yesterday.  Interventions attempted: Rest, hydration, or home remedies.  Symptoms are: stable.  Triage Disposition: See Physician Within 24 Hours  Patient/caregiver understands and will follow disposition?: Yes                             Copied from CRM [id number]. Topic: Clinical - Red Word Triage >> Mar 06, 2024  9:08 AM Kim Pennington wrote: Red Word that prompted transfer to Nurse Triage: Patient did not feel well yesterday. Felt light-headed, dizzy, and tingling all over.  BP Reading: 100/81 and 98/81 Reason for Disposition  Taking a medicine that could cause dizziness (e.g., blood pressure medications, diuretics)  Answer Assessment - Initial Assessment Questions 1. DESCRIPTION: Describe your dizziness.     Feels like syncope 2. LIGHTHEADED: Do you feel lightheaded? (e.g., somewhat faint, woozy, weak upon standing)     Yes, states it felt like she was going to pass out 4. SEVERITY: How bad is it?  Do you feel like you are going to faint? Can you stand and walk?     States she is still able to walk around normally, denies falls, denies fainting 5. ONSET:  When did the dizziness begin?     Yesterday after taking first dose of HCTZ 6. AGGRAVATING FACTORS: Does anything make it worse? (e.g., standing, change in head position)     Stress 7. HEART RATE: Can you tell me your heart rate? How many beats in 15 seconds?  (Note: Not all patients can do this.)       Unsure 8. CAUSE: What do you think is causing the dizziness? (e.g., decreased fluids or food, diarrhea, emotional distress, heat exposure, new medicine, sudden standing, vomiting; unknown)     Took first dose of HCTZ yesterday  9. RECURRENT SYMPTOM: Have you had  dizziness before? If Yes, ask: When was the last time? What happened that time?     Yes, occurred on and off a week ago  10. OTHER SYMPTOMS: Do you have any other symptoms? (e.g., fever, chest pain, vomiting, diarrhea, bleeding)     Dull chest pain, headache, mild SOB- patient able to speak in clear and complete sentences while on phone with this RN, nausea yesterday, sweating yesterday, denies vomiting, states symptoms have improved today    This RN advised patient to be evaluated in office today. No availability with PCP. Scheduled with alternate provider in office today. Patient agreed to monitor symptoms and report if anything worsens.  Protocols used: Dizziness - Lightheadedness-A-AH

## 2024-03-06 NOTE — Progress Notes (Signed)
 Established patient visit  Patient: Kim Pennington   DOB: 08-16-1955   68 y.o. Female  MRN: [id number] Visit Date: 03/06/2024  Today's healthcare provider: Jolynn Spencer, PA-C   Chief Complaint  Patient presents with   Dizziness    Light headed, dizzy, mid chest pain sharp last night, achey/dull today onset 1 day    Subjective     HPI     Dizziness    Additional comments: Light headed, dizzy, mid chest pain sharp last night, achey/dull today onset 1 day       Last edited by Wilfred Hargis RAMAN, CMA on 03/06/2024  1:04 PM.       Discussed the use of AI scribe software for clinical note transcription with the patient, who gave verbal consent to proceed.  History of Present Illness Kim Pennington is a 68 year old female with syncope and stage three renal failure who presents with labile blood pressure.  She experiences labile blood pressure with fluctuations from very high to very low readings, such as a drop from 182/84 to 98/81. Last night, she had intense chest pain, a severe headache, and shortness of breath, which have now subsided to a dull, achy pain and persistent shortness of breath. Lightheadedness occurs during daily chores, requiring her to sit with her head between her knees. She has a history of syncope with an 'aura' preceding episodes, prompting her to sit down quickly.  Dizziness occurs with head movement and when transitioning from lying down to sitting or standing, though not significantly at the moment. Significant dizziness is noted when going downstairs and during daily activities. Tingling in her extremities, described as 'like they're waking up from being asleep,' is present and unusual for her.  She has a pacemaker and recently started hydroxyzine, taking half a tablet, but did not take it today. She doubts the medication caused the significant change in her blood pressure. She drinks over a gallon of water daily and ensures her home is safe for her husband, who is  a heart failure patient. No swelling, edema, visual changes, or weakness are reported. A dry cough and slight sinus drainage are present, attributed to rheumatoid arthritis.       03/03/2024    1:22 PM 01/15/2024    2:40 PM 01/15/2024    1:51 PM  Depression screen PHQ 2/9  Decreased Interest 0 0 0  Down, Depressed, Hopeless 0 0 1  PHQ - 2 Score 0 0 1  Altered sleeping 1 1 3   Tired, decreased energy 0 0 1  Change in appetite 0 0 0  Feeling bad or failure about yourself  0 0 0  Trouble concentrating 1 0 1  Moving slowly or fidgety/restless 0 0 0  Suicidal thoughts 0 0 0  PHQ-9 Score 2 1 6   Difficult doing work/chores Not difficult at all Not difficult at all Very difficult      03/03/2024    1:23 PM 01/15/2024    2:41 PM 01/15/2024    1:52 PM 11/15/2023   10:17 AM  GAD 7 : Generalized Anxiety Score  Nervous, Anxious, on Edge 2 1 2  0  Control/stop worrying 1 1 2 1   Worry too much - different things 2 1 1  0  Trouble relaxing 1 1 2  0  Restless 1 1 2  0  Easily annoyed or irritable 1 1 2  0  Afraid - awful might happen 1 0 0 0  Total GAD 7 Score 9 6 11 1   Anxiety Difficulty Somewhat  difficult Not difficult at all Somewhat difficult Not difficult at all    Medications: Outpatient Medications Prior to Visit  Medication Sig   alendronate  (FOSAMAX ) 70 MG tablet Take 1 tablet (70 mg total) by mouth once a week.   ALPRAZolam  (XANAX ) 0.5 MG tablet Take 1 tablet (0.5 mg total) by mouth as needed for anxiety.   atorvastatin  (LIPITOR ) 20 MG tablet Take 1 tablet (20 mg total) by mouth daily.   Calcium  Carbonate-Vit D-Min (CALCIUM  1200 PO) Take 1 tablet by mouth daily.   CRANBERRY PO Take 1 tablet by mouth daily.   HUMIRA, 2 PEN, 40 MG/0.4ML pen    hydrochlorothiazide (HYDRODIURIL) 25 MG tablet Take 0.5 tablets (12.5 mg total) by mouth daily.   pantoprazole  (PROTONIX ) 20 MG tablet Take 1 tablet (20 mg total) by mouth daily.   prazosin  (MINIPRESS ) 1 MG capsule Take 1 capsule (1 mg total) by  mouth at bedtime.   tamsulosin  (FLOMAX ) 0.4 MG CAPS capsule Take 0.4 mg by mouth at bedtime.   valACYclovir  (VALTREX ) 1000 MG tablet Take 1 tablet (1,000 mg total) by mouth 2 (two) times daily as needed.   No facility-administered medications prior to visit.    Review of Systems All negative Except see HPI       Objective    BP 139/88   Pulse 61   Ht 5' 3 (1.6 m)   Wt 139 lb (63 kg)   SpO2 100%   BMI 24.62 kg/m    No data found.  Physical Exam Vitals reviewed.  Constitutional:      General: She is not in acute distress.    Appearance: Normal appearance. She is well-developed. She is not diaphoretic.  HENT:     Head: Normocephalic and atraumatic.  Eyes:     General: No scleral icterus.    Conjunctiva/sclera: Conjunctivae normal.  Neck:     Thyroid : No thyromegaly.  Cardiovascular:     Rate and Rhythm: Normal rate and regular rhythm.     Pulses: Normal pulses.     Heart sounds: Normal heart sounds. No murmur heard. Pulmonary:     Effort: Pulmonary effort is normal. No respiratory distress.     Breath sounds: Normal breath sounds. No wheezing, rhonchi or rales.  Musculoskeletal:     Cervical back: Neck supple.     Right lower leg: No edema.     Left lower leg: No edema.  Lymphadenopathy:     Cervical: No cervical adenopathy.  Skin:    General: Skin is warm and dry.     Findings: No rash.  Neurological:     Mental Status: She is alert and oriented to person, place, and time. Mental status is at baseline.  Psychiatric:        Mood and Affect: Mood normal.        Behavior: Behavior normal.      No results found for any visits on 03/06/24.      Assessment & Plan Labile blood pressure with orthostatic changes Significant blood pressure fluctuations with orthostatic changes and dizziness. Diastolic pressure increases upon standing, possibly related to pacemaker. - Perform orthostatic blood pressure measurements. - Order lab work including electrolytes and  kidney function tests. - Advise to maintain hydration and exercise caution when transitioning positions.  Syncope Episodes of dizziness and lightheadedness, particularly when standing. Unpredictable syncope episodes. - Advise to sit down and place head between knees when lightheaded. - Contact cardiologist for reassessment of pacemaker and syncope management.  Pacemaker in  situ Pacemaker may contribute to labile blood pressure and syncope. Recent medication change unlikely cause. - Contact cardiologist for pacemaker assessment and possible adjustment. - Provide recent EKG in media format for cardiologist review.  Chest pain and shortness of breath Recent episode of intense chest pain, headache, and shortness of breath, now dull and achy. Possible connection to pacemaker. - Order cardiac markers. - Advise to contact cardiologist regarding pacemaker assessment.  Tingling in extremities Tingling possibly related to recent chest pain and shortness of breath episode. - Check magnesium levels in addition to the other labs Will follow-up  Stage 3 chronic kidney disease, single kidney Chronic Stage 3 chronic kidney disease with single kidney. Importance of maintaining hydration emphasized. - Order kidney function tests. - Advise to maintain regular hydration. Will reassess after  receiving lab results  Orders Placed This Encounter  Procedures   EKG 12-Lead    No follow-ups on file.   The patient was advised to call back or seek an in-person evaluation if the symptoms worsen or if the condition fails to improve as anticipated.  I discussed the assessment and treatment plan with the patient. The patient was provided an opportunity to ask questions and all were answered. The patient agreed with the plan and demonstrated an understanding of the instructions.  I, Sarp Vernier, PA-C have reviewed all documentation for this visit. The documentation on 03/06/2024  for the exam, diagnosis,  procedures, and orders are all accurate and complete.  Jolynn Spencer, [facility], MMS [facility] [phone] (phone) [phone] (fax)  [facility]

## 2024-03-07 ENCOUNTER — Telehealth: Payer: Self-pay | Admitting: Cardiology

## 2024-03-07 LAB — COMPREHENSIVE METABOLIC PANEL WITH GFR
ALT: 25 IU/L (ref 0–32)
AST: 28 IU/L (ref 0–40)
Albumin: 4.8 g/dL (ref 3.9–4.9)
Alkaline Phosphatase: 59 IU/L (ref 49–135)
BUN/Creatinine Ratio: 25 (ref 12–28)
BUN: 25 mg/dL (ref 8–27)
Bilirubin Total: 0.4 mg/dL (ref 0.0–1.2)
CO2: 23 mmol/L (ref 20–29)
Calcium: 10.3 mg/dL (ref 8.7–10.3)
Chloride: 92 mmol/L — ABNORMAL LOW (ref 96–106)
Creatinine, Ser: 0.99 mg/dL (ref 0.57–1.00)
Globulin, Total: 2.4 g/dL (ref 1.5–4.5)
Glucose: 89 mg/dL (ref 70–99)
Potassium: 4.5 mmol/L (ref 3.5–5.2)
Sodium: 132 mmol/L — ABNORMAL LOW (ref 134–144)
Total Protein: 7.2 g/dL (ref 6.0–8.5)
eGFR: 62 mL/min/1.73 (ref >59)

## 2024-03-07 LAB — CBC WITH DIFFERENTIAL/PLATELET
Basophils Absolute: 0.1 x10E3/uL (ref 0.0–0.2)
Basos: 1 %
EOS (ABSOLUTE): 0.4 x10E3/uL (ref 0.0–0.4)
Eos: 5 %
Hematocrit: 42.9 % (ref 34.0–46.6)
Hemoglobin: 14.3 g/dL (ref 11.1–15.9)
Immature Grans (Abs): 0 x10E3/uL (ref 0.0–0.1)
Immature Granulocytes: 0 %
Lymphocytes Absolute: 2.4 x10E3/uL (ref 0.7–3.1)
Lymphs: 33 %
MCH: 31 pg (ref 26.6–33.0)
MCHC: 33.3 g/dL (ref 31.5–35.7)
MCV: 93 fL (ref 79–97)
Monocytes Absolute: 0.7 x10E3/uL (ref 0.1–0.9)
Monocytes: 10 %
Neutrophils Absolute: 3.8 x10E3/uL (ref 1.4–7.0)
Neutrophils: 51 %
Platelets: 223 x10E3/uL (ref 150–450)
RBC: 4.61 x10E6/uL (ref 3.77–5.28)
RDW: 12.5 % (ref 11.7–15.4)
WBC: 7.3 x10E3/uL (ref 3.4–10.8)

## 2024-03-07 LAB — TSH: TSH: 0.814 u[IU]/mL (ref 0.450–4.500)

## 2024-03-07 LAB — TROPONIN T: Troponin T (Highly Sensitive): <6 ng/L (ref 0–14)

## 2024-03-07 NOTE — Telephone Encounter (Signed)
 Returned call to patient, who reported experiencing episodes of dizziness and pre-syncope. The patient also reported fluctuating blood pressure readings ranging from 168/52 to 100/81 within the same day. Additionally, she reported an episode of sharp chest pain last night, accompanied by diaphoresis and dizziness. The patient denies any symptoms currently.  An appointment is scheduled for Monday, 03/10/24, for further evaluation. The patient was advised to report to the ER if symptoms recur.  Nurse will forward the message to the device clinic for remote transmission

## 2024-03-07 NOTE — Telephone Encounter (Signed)
 Pt c/o BP issue: STAT if pt c/o blurred vision, one-sided weakness or slurred speech.  STAT if BP is GREATER than 180/120 TODAY.  STAT if BP is LESS than 90/60 and SYMPTOMATIC TODAY  1. What is your BP concern? Pt concerned her BP has been up and down. PCP told her to call cardiology.   2. Have you taken any BP medication today? No, PCP put her on one and she stopped taking   3. What are your last 5 BP readings?  168/52  100/81   4. Are you having any other symptoms (ex. Dizziness, headache, blurred vision, passed out)? Dizziness and headache

## 2024-03-07 NOTE — Telephone Encounter (Signed)
 Last scheduled transmission received 02/13/2024.  Pt is NOT dependent.  Atrially paces 14%.  V paces <1%.  Atrially paces d/t rate response programmed on.  Pt is scheduled to see EP APP on Monday.  Pt has ER precautions.

## 2024-03-10 ENCOUNTER — Ambulatory Visit: Attending: Cardiology | Admitting: Cardiology

## 2024-03-10 ENCOUNTER — Encounter: Payer: Self-pay | Admitting: Cardiology

## 2024-03-10 VITALS — BP 126/78 | HR 75 | Ht 62.0 in | Wt 139.6 lb

## 2024-03-10 DIAGNOSIS — R072 Precordial pain: Secondary | ICD-10-CM | POA: Diagnosis not present

## 2024-03-10 DIAGNOSIS — I495 Sick sinus syndrome: Secondary | ICD-10-CM | POA: Diagnosis not present

## 2024-03-10 MED ORDER — METOPROLOL TARTRATE 100 MG PO TABS: 100.0000 mg | ORAL_TABLET | Freq: Once | ORAL | 0 refills | Status: AC

## 2024-03-10 NOTE — Patient Instructions (Addendum)
 Medication Instructions:   Stop Hydrochlorothiazide 12.5 MG daily.  Please take Metoprolol Tartrate 100 MG once two hours prior to Cardiac CTA.   *If you need a refill on your cardiac medications before your next appointment, please call your pharmacy*  Lab Work:  No labs ordered today   If you have labs (blood work) drawn today and your tests are completely normal, you will receive your results only by: MyChart Message (if you have MyChart) OR A paper copy in the mail If you have any lab test that is abnormal or we need to change your treatment, we will call you to review the results.  Testing/Procedures:   Your cardiac CT will be scheduled at one of the below locations:   [facility] [address] [place], [place] [address] [phone]  Please follow these instructions carefully (unless otherwise directed):  An IV will be required for this test and Nitroglycerin will be given.  Hold all erectile dysfunction medications at least 3 days (72 hrs) prior to test. (Ie viagra, cialis, sildenafil, tadalafil, etc)   On the Night Before the Test: Be sure to Drink plenty of water. Do not consume any caffeinated/decaffeinated beverages or chocolate 12 hours prior to your test. Do not take any antihistamines 12 hours prior to your test.  On the Day of the Test: Drink plenty of water until 1 hour prior to the test. Do not eat any food 1 hour prior to test. You may take your regular medications prior to the test.  Take metoprolol (Lopressor) 100 MG two hours prior to test. If you take Furosemide/Hydrochlorothiazide/Spironolactone/Chlorthalidone, please HOLD on the morning of the test. Patients who wear a continuous glucose monitor MUST remove the device prior to scanning. FEMALES- please wear underwire-free bra if available, avoid dresses & tight clothing    After the Test: Drink plenty of water. After receiving IV contrast, you may experience a mild  flushed feeling. This is normal. On occasion, you may experience a mild rash up to 24 hours after the test. This is not dangerous. If this occurs, you can take Benadryl 25 mg, Zyrtec, Claritin, or Allegra and increase your fluid intake. (Patients taking Tikosyn should avoid Benadryl, and may take Zyrtec, Claritin, or Allegra) If you experience trouble breathing, this can be serious. If it is severe call 911 IMMEDIATELY. If it is mild, please call our office.  We will call to schedule your test 2-4 weeks out understanding that some insurance companies will need an authorization prior to the service being performed.   For more information and frequently asked questions, please visit our website : [email or web address]  For non-scheduling related questions, please contact the cardiac imaging nurse navigator should you have any questions/concerns: Cardiac Imaging Nurse Navigators Direct Office Dial: [phone]   For scheduling needs, including cancellations and rescheduling, please call [place], [phone].   Follow-Up: At [facility] you and your health needs are our priority.  As part of our continuing mission to provide you with exceptional heart care, our providers are all part of one team.  This team includes your primary Cardiologist (physician) and Advanced Practice Providers or APPs (Physician Assistants and Nurse Practitioners) who all work together to provide you with the care you need, when you need it.   Provider:   Suzann Riddle, NP    We recommend signing up for the patient portal called MyChart.  Sign up information is provided on this After Visit Summary.  MyChart is used to connect  with patients for Virtual Visits (Telemedicine).  Patients are able to view lab/test results, encounter notes, upcoming appointments, etc.  Non-urgent messages can be sent to your provider as well.   To learn more about what you can do with MyChart, go to  [email or web address].

## 2024-03-10 NOTE — Progress Notes (Signed)
 Electrophysiology Clinic Note    Date:  03/10/2024  Patient ID:  Kim, Pennington Dec 10, 1955, MRN [id number] PCP:  Sharma Coyer, MD  Cardiologist:  None   Electrophysiologist:  OLE ONEIDA HOLTS, MD  Electrophysiology APP:  Shania Bjelland, NP    Discussed the use of AI scribe software for clinical note transcription with the patient, who gave verbal consent to proceed.   Patient Profile    Chief Complaint: presyncope  History of Present Illness: Kim Pennington is a 68 y.o. female with PMH notable for SND s/p PPM, syncope, HTN, CKD - 3, OSA; seen today for OLE ONEIDA HOLTS, MD for routine electrophysiology followup.   She last saw Dr. HOLTS 07/2022 after hospitalization for syncope, thought to be vasovagal. No concerning arrhythmias on device interrogation. I saw her 01/2024 for routine follow-up where she was overall feeling well though did have episodes of palpitations during hiking. Device interrogation with PMT episodes and brief SVT episodes.   She called clinic last week after episode of chest pain, diaphoresis, dizziness.   On follow-up today, two weeks ago she had several pre-syncopal episodes. These episodes are preceded by an aura, diaphoresis, tingling in her appendages, and lightheadedness. If she does not sit with her head between her knees, she will pass out. The episodes occur during activity while she is up and busy, not with position changes.   She saw PCP for routine follow-up 9/29, where BP readings were elevated and 12.5mg  hydrochlorothiazide was started. After first dose, she had syncopal episode, and so has not continued it. She reports chest pain and pressure associated with syncope episodes, described as heaviness and difficulty catching her breath. No recent illness, changes in medication other than hydrochlorothiazide, or significant lifestyle changes.  She brings BP log to appt, most readings are 100-120 systolic, with very few in 160s.     Arrhythmia/Device History St Jude dual chamber PPM, imp 04/2014; dx SND    ROS:  Please see the history of present illness. All other systems are reviewed and otherwise negative.    Physical Exam    VS:  BP 126/78   Pulse 75   Ht 5' 2 (1.575 m)   Wt 139 lb 9.6 oz (63.3 kg)   SpO2 97%   BMI 25.53 kg/m  BMI: Body mass index is 25.53 kg/m.  Orthostatic VS for the past 24 hrs (Last 3 readings):  BP- Lying Pulse- Lying BP- Sitting Pulse- Sitting BP- Standing at 0 minutes Pulse- Standing at 0 minutes BP- Standing at 3 minutes Pulse- Standing at 3 minutes  03/10/24 1403 131/83 66 127/83 82 130/84 74 (!) 146/99 82    Wt Readings from Last 3 Encounters:  03/10/24 139 lb 9.6 oz (63.3 kg)  03/06/24 139 lb (63 kg)  03/03/24 130 lb (59 kg)     GEN- The patient is well appearing, alert and oriented x 3 today.   Lungs- Clear to ausculation bilaterally, normal work of breathing.  Heart- Regular rate and rhythm, no murmurs, rubs or gallops Extremities- No peripheral edema, warm, dry Skin-  device pocket well-healed, no tethering   Brief check performed without iterative lead testing/measurements No atrial or ventricular arrhythmias    Studies Reviewed   Previous EP, cardiology notes.    EKG is ordered. Personal review of EKG from today shows:    EKG Interpretation Date/Time:  Monday March 10 2024 13:35:51 EDT Ventricular Rate:  75 PR Interval:  164 QRS Duration:  66 QT Interval:  386 QTC Calculation: 431 R Axis:   -2  Text Interpretation: Normal sinus rhythm Low voltage QRS Confirmed by Krisanne Lich [phone]) on 03/10/2024 2:24:09 PM     TTE, 05/18/2022  1. Left ventricular ejection fraction, by estimation, is 60 to 65%. Left ventricular ejection fraction by 3D volume is 61 %. The left ventricle has normal function. The left ventricle has no regional wall motion abnormalities. Left ventricular diastolic parameters were normal. The average left ventricular global  longitudinal strain is -18.9 %. The global longitudinal strain is normal.   2. Right ventricular systolic function is normal. The right ventricular size is normal. There is normal pulmonary artery systolic pressure. The estimated right ventricular systolic pressure is 34.4 mmHg.   3. Left atrial size was mildly dilated.   4. The mitral valve is normal in structure. Trivial mitral valve regurgitation. No evidence of mitral stenosis.   5. The aortic valve is normal in structure. Aortic valve regurgitation is not visualized. No aortic stenosis is present.   6. The inferior vena cava is dilated in size with <50% respiratory variability, suggesting right atrial pressure of 15 mmHg.    Assessment and Plan     #) SND s/p PPM #) syncope, presyncope #) chest pain, SOB Device functioning well, does not explain syncopal episodes Orthostatics negative in office today Stop  hydrochlorothiazide With chest pain and SOB occurring with episodes, will proceed with coronary CTA Consider carotid u/s if episodes continue  Recent lab work with PCP stable     Current medicines are reviewed at length with the patient today.   The patient has concerns regarding her medicines.  The following changes were made today:   STOP hydrochlorothiazide     Labs/ tests ordered today include:  Orders Placed This Encounter  Procedures   CT CORONARY MORPH W/CTA COR W/SCORE W/CA W/CM &/OR WO/CM   EKG 12-Lead     Disposition: Follow up with EP Team or EP APP in 2 years , sooner based on CT results Continue remote monitoring every 3 months  She has follow-up scheduled in ~6 weeks with PCP for ongoing workup of syncope     Signed, Chantal Needle, NP  03/10/24  3:47 PM  Electrophysiology [facility]

## 2024-03-11 ENCOUNTER — Ambulatory Visit: Payer: Self-pay | Admitting: Physician Assistant

## 2024-03-11 ENCOUNTER — Telehealth: Payer: Self-pay

## 2024-03-11 NOTE — Telephone Encounter (Unsigned)
 Copied from CRM [id number]. Topic: Clinical - Medical Advice >> Mar 10, 2024  3:52 PM Ivette P wrote: Reason for CRM: Pt called in about the recent medication she was prescribed by dr. Makiera    Started medication and only took one dose and blood pressure was really low. and cardiologist advised not to take it until has CT scan of heart, pt wanted to let primary know about medication

## 2024-03-12 NOTE — Telephone Encounter (Signed)
 Acknowledged. Agree with plan as recommended by her cardiologist

## 2024-03-21 NOTE — Progress Notes (Signed)
 Kim Pennington                                          MRN: [id number]   03/21/2024   The [facility] Quality Team Specialist reviewed this patient medical record for the purposes of chart review for care gap closure. The following were reviewed: abstraction for care gap closure-controlling blood pressure.    [facility] Quality Team

## 2024-04-01 ENCOUNTER — Encounter (HOSPITAL_COMMUNITY): Payer: Self-pay

## 2024-04-01 NOTE — Telephone Encounter (Cosign Needed)
 Kim Pennington

## 2024-04-02 ENCOUNTER — Telehealth (HOSPITAL_COMMUNITY): Payer: Self-pay | Admitting: *Deleted

## 2024-04-02 DIAGNOSIS — D2261 Melanocytic nevi of right upper limb, including shoulder: Secondary | ICD-10-CM | POA: Diagnosis not present

## 2024-04-02 DIAGNOSIS — L570 Actinic keratosis: Secondary | ICD-10-CM | POA: Diagnosis not present

## 2024-04-02 DIAGNOSIS — D225 Melanocytic nevi of trunk: Secondary | ICD-10-CM | POA: Diagnosis not present

## 2024-04-02 DIAGNOSIS — D2272 Melanocytic nevi of left lower limb, including hip: Secondary | ICD-10-CM | POA: Diagnosis not present

## 2024-04-02 DIAGNOSIS — D2262 Melanocytic nevi of left upper limb, including shoulder: Secondary | ICD-10-CM | POA: Diagnosis not present

## 2024-04-02 DIAGNOSIS — L820 Inflamed seborrheic keratosis: Secondary | ICD-10-CM | POA: Diagnosis not present

## 2024-04-02 DIAGNOSIS — L538 Other specified erythematous conditions: Secondary | ICD-10-CM | POA: Diagnosis not present

## 2024-04-02 NOTE — Telephone Encounter (Signed)

## 2024-04-03 ENCOUNTER — Ambulatory Visit: Payer: Self-pay | Admitting: Cardiology

## 2024-04-03 ENCOUNTER — Ambulatory Visit
Admission: RE | Admit: 2024-04-03 | Discharge: 2024-04-03 | Disposition: A | Discharge status: Home / Self Care | Source: Ambulatory Visit | Attending: Cardiology | Admitting: Cardiology

## 2024-04-03 DIAGNOSIS — R072 Precordial pain: Secondary | ICD-10-CM | POA: Insufficient documentation

## 2024-04-03 MED ORDER — NITROGLYCERIN 0.4 MG SL SUBL
0.8000 mg | SUBLINGUAL_TABLET | Freq: Once | SUBLINGUAL | Status: AC
  Administered 2024-04-03: 0.8 mg via SUBLINGUAL

## 2024-04-03 MED ORDER — IOHEXOL 350 MG/ML SOLN
100.0000 mL | Freq: Once | INTRAVENOUS | Status: AC | PRN
  Administered 2024-04-03: 100 mL via INTRAVENOUS

## 2024-04-03 NOTE — Progress Notes (Signed)
 Patient tolerated procedure well. W/C to lobby.  Ambulate w/o difficulty. Denies light headedness or being dizzy. Encouraged to drink extra water today and reasoning explained. Verbalized understanding. All questions answered. ABC intact. No further needs. Discharge from procedure area w/o issues.

## 2024-04-07 ENCOUNTER — Ambulatory Visit (INDEPENDENT_AMBULATORY_CARE_PROVIDER_SITE_OTHER)

## 2024-04-07 ENCOUNTER — Ambulatory Visit
Admission: EM | Admit: 2024-04-07 | Discharge: 2024-04-07 | Disposition: A | Discharge status: Home / Self Care | Attending: Emergency Medicine | Admitting: Emergency Medicine

## 2024-04-07 ENCOUNTER — Ambulatory Visit: Payer: Self-pay

## 2024-04-07 DIAGNOSIS — R0789 Other chest pain: Secondary | ICD-10-CM

## 2024-04-07 DIAGNOSIS — R0781 Pleurodynia: Secondary | ICD-10-CM | POA: Diagnosis not present

## 2024-04-07 DIAGNOSIS — Z950 Presence of cardiac pacemaker: Secondary | ICD-10-CM | POA: Diagnosis not present

## 2024-04-07 DIAGNOSIS — W19XXXA Unspecified fall, initial encounter: Secondary | ICD-10-CM | POA: Diagnosis not present

## 2024-04-07 NOTE — Telephone Encounter (Signed)
 FYI Only or Action Required?: FYI only for provider: patient to UC.  Patient was last seen in primary care on 03/06/2024 by Ostwalt, Janna, PA-C.  Called Nurse Triage reporting Rib Injury.  Symptoms began today.  Interventions attempted: Rest, hydration, or home remedies.  Symptoms are: unchanged.  Triage Disposition: See Physician Within 24 Hours  Patient/caregiver understands and will follow disposition?: Yes  Copied from CRM #[id number]. Topic: General - Other >> Apr 07, 2024  1:32 PM Cleave MATSU wrote: Reason for CRM: pt wants to speak to a nurse. Please follow back up with pt . She only specified that she may have broken a rib Reason for Disposition  [1] MODERATE pain (e.g., interferes with normal activities) AND [2] high-risk adult (e.g., age > 60 years, osteoporosis, chronic steroid use)  Answer Assessment - Initial Assessment Questions 1. MECHANISM: How did the injury happen?     Patient fell when she was jumping to catch her cat.  She landed on her right hand under her rib, under her breast 2. ONSET: When did the injury happen? (.e.g., minutes, hours, days ago)     1300, one hour ago 3. LOCATION: Where on the chest is the injury located?     Right chest near breast 4. APPEARANCE: What does the injury look like?     denies 5. BLEEDING: Is there any bleeding now? If Yes, ask: How long has it been bleeding?     denies 6. SEVERITY: Any difficulty with breathing?     Pain with deep breath 7. SIZE: For cuts, bruises, or swelling, ask: How large is it? (e.g., inches or centimeters)     N/a 8. PAIN: Is there pain? If Yes, ask: How bad is the pain? (e.g., Scale 0-10; none, mild, moderate, severe)     8/10  Protocols used: Chest Injury-A-AH

## 2024-04-07 NOTE — Discharge Instructions (Addendum)
 Today you are evaluated for your rib pain  X-rays pending and you will be notified of results via telephone  If there is a break in the bone there is no surgical intervention and the ribs are allowed to heal naturally, more information in your packet  You have been given a rib belt to use for compression which helps to minimize pain with movement, and may use as needed  You may apply ice or heat over the affected area in 10 to 15-minute intervals  While pain is present cushion the body with pillows whenever sitting and lying  Please follow-up with your primary doctor for any further concerns

## 2024-04-07 NOTE — Telephone Encounter (Signed)
 Reviewed telephone report   Agree with urgent evaluation as recommended

## 2024-04-07 NOTE — ED Triage Notes (Signed)
 Pt fell today trying to keep her cat from going into the road. Her feet got twisted up and she fell on to cement. Denies head trauma. Complains of pain in her left hand and right knee. Most concern is the pain and impact to her right ribcage. Pain is worse with movement talking and breathing.

## 2024-04-07 NOTE — ED Provider Notes (Signed)
 [facility]    CSN: [id number] Arrival date & time: 04/07/24  1424      History   Chief Complaint Chief Complaint  Patient presents with   Fall    HPI Ayana Imhof is a 68 y.o. female.   Patient presents for evaluation of right-sided getting today after fall, was walking and tripped landing on the right side, occurred 1 to 2 hours ago.  Has not attempted treatment.  Symptoms are described as severe, rating it 8 out of 10, exacerbated by bending, breathing, cough etc.  Denies shortness of breath.  Past Medical History:  Diagnosis Date   Acquired solitary kidney    right nephrectomy   Anemia    Anxiety    Arthritis    C. difficile diarrhea    Depression    Hepatitis B    Hyperlipidemia    Hypertension    Kidney disease, chronic, stage III (GFR 30-59 ml/min) (HCC)    Kidney stones    Lymphocytic colitis    Memory loss    Osteoporosis    Pacemaker    Renal failure, chronic, stage 3 (moderate) (HCC) 2015   Left kidney    Skin cancer     Patient Active Problem List   Diagnosis Date Noted   Hyperlipidemia LDL goal <70 01/18/2024   Rheumatoid arthritis (HCC) 04/01/2023   Inhalation injury due to particulate matter 02/06/2023   Arthralgia of right hand 11/19/2022   Trigger finger of right thumb 11/19/2022   Low vitamin B12 level 09/30/2022   Postmenopausal estrogen deficiency 09/30/2022   Syncope 05/18/2022   Glucosuria 05/18/2022   Chronic diastolic CHF (congestive heart failure) (HCC) 05/18/2022   Mood disorder 05/18/2022   Lumbar back pain with radiculopathy affecting left lower extremity 01/20/2022   Perineal numbness 01/20/2022   History of back surgery 01/20/2022   Oral herpes 10/11/2021   Osteoarthritis of knees, bilateral 09/06/2021   Seborrheic keratoses 02/01/2021   Chest pain 02/01/2021   Anemia 11/08/2020   History of melanoma 08/10/2020   Scoliosis 06/29/2020   Hormone replacement therapy (HRT) 06/29/2020   Lymphocytic colitis  06/29/2020   Chronic foot pain 06/29/2020   H/O right nephrectomy 04/10/2020   Chronic renal insufficiency, stage III (moderate) 10/02/2018   Gastroesophageal reflux disease 10/02/2018   Hypertension 10/02/2018   Insomnia 10/02/2018   Pacemaker 10/02/2018   Osteoporosis 10/02/2018   Sinoatrial node dysfunction (HCC) 10/02/2018   Recurrent major depressive disorder 10/02/2018   Posttraumatic stress disorder 10/02/2018   Cobalamin deficiency 10/02/2018   Arthritis 10/02/2018   Thoracic spine pain 10/02/2018   Enthesopathy of hip region 10/02/2018   Migraine headache 11/18/2012   Left flank pain 06/19/2012    Past Surgical History:  Procedure Laterality Date   ABDOMINAL HYSTERECTOMY  11/2006   ABDOMINOPLASTY     BREAST LUMPECTOMY Right 1979   CARDIAC CATHETERIZATION  04/24/2014   CARPAL TUNNEL RELEASE Bilateral 03/29/2009   CESAREAN SECTION     x4   COLONOSCOPY     CYSTOSCOPY WITH RETROGRADE PYELOGRAM, URETEROSCOPY AND STENT PLACEMENT Left 03/12/2023   Procedure: CYSTOSCOPY WITH RETROGRADE PYELOGRAM, URETEROSCOPY AND STENT PLACEMENT;  Surgeon: Shona Layman BROCKS, MD;  Location: [facility];  Service: Urology;  Laterality: Left;   FOOT SURGERY Bilateral    HIP ARTHROSCOPY Right 09/14/2016   reduction of lesser trochanter    HIP ARTHROSCOPY Left 04/25/2016   w/ femoroplasty   left thumb tendon  04/25/2016   LIPOSUCTION     LUMBAR LAMINECTOMY  11/23/2019   foraminotomy, discectomy, facectomy   NEPHRECTOMY Right 2009   PACEMAKER IMPLANT  04/24/2014   RHINOPLASTY     x 2, 1999, 2003   ROTATOR CUFF REPAIR Right    SHOULDER ARTHROSCOPY Right 12/30/2018   x2   TONSILLECTOMY  1961   UMBILICAL HERNIA REPAIR  1998   UPPER GASTROINTESTINAL ENDOSCOPY     WEIL OSTEOTOMY Bilateral 11/27/2019    OB History   No obstetric history on file.      Home Medications    Prior to Admission medications   Medication Sig Start Date End Date Taking? Authorizing Provider  alendronate  (FOSAMAX )  70 MG tablet Take 1 tablet (70 mg total) by mouth once a week. 11/15/23   Simmons-Robinson, Rockie, MD  ALPRAZolam  (XANAX ) 0.5 MG tablet Take 1 tablet (0.5 mg total) by mouth as needed for anxiety. 11/15/23   Simmons-Robinson, Rockie, MD  atorvastatin  (LIPITOR ) 20 MG tablet Take 1 tablet (20 mg total) by mouth daily. 01/18/24   Simmons-Robinson, Rockie, MD  Calcium  Carbonate-Vit D-Min (CALCIUM  1200 PO) Take 1 tablet by mouth daily.    [provider]  CRANBERRY PO Take 1 tablet by mouth daily.    [provider]  HUMIRA, 2 PEN, 40 MG/0.4ML pen  10/01/23   [provider]  metoprolol tartrate (LOPRESSOR) 100 MG tablet Take 1 tablet (100 mg total) by mouth once for 1 dose. 03/10/24 03/10/24  Riddle, Suzann, NP  pantoprazole  (PROTONIX ) 20 MG tablet Take 1 tablet (20 mg total) by mouth daily. 01/21/24 01/15/25  Nandigam, Kavitha V, MD  prazosin  (MINIPRESS ) 1 MG capsule Take 1 capsule (1 mg total) by mouth at bedtime. 11/15/23   Simmons-Robinson, Rockie, MD  tamsulosin  (FLOMAX ) 0.4 MG CAPS capsule Take 0.4 mg by mouth at bedtime.    [provider]  valACYclovir  (VALTREX ) 1000 MG tablet Take 1 tablet (1,000 mg total) by mouth 2 (two) times daily as needed. 11/15/23   Simmons-Robinson, Rockie, MD    Family History Family History  Problem Relation Age of Onset   Diabetes Mother    Parkinson's disease Mother    Alzheimer's disease Mother    Heart disease Father    Alzheimer's disease Father    Hypertension Father    CVA Father    Rheum arthritis Father    Stomach cancer Sister    Hyperlipidemia Sister    Hypertension Sister    Diabetes Sister    Liver disease Sister    Drug abuse Sister    Obesity Sister    Hyperlipidemia Sister    Hypertension Sister    Diabetes Sister    Heart disease Sister    Obesity Sister    Diabetes Sister    Breast cancer Sister 30       x2   Hyperlipidemia Sister    Hypertension Sister    Colon polyps Sister    Obesity Sister     Hyperlipidemia Sister    Hypertension Sister    Obesity Sister    Hyperlipidemia Brother    Hypertension Brother    Heart disease Brother    Obesity Brother    Diabetes Maternal Aunt    Diabetes Maternal Grandmother    Heart attack Paternal Grandfather    Osteoarthritis Daughter    Obesity Son    Colon cancer Neg Hx    Esophageal cancer Neg Hx     Social History Social History   Tobacco Use   Smoking status: Never   Smokeless tobacco: Never  Vaping Use   Vaping status: Never Used  Substance Use Topics   Alcohol use: Never   Drug use: Never     Allergies   Patient has no known allergies.   Review of Systems Review of Systems   Physical Exam Triage Vital Signs ED Triage Vitals  Encounter Vitals Group     BP 04/07/24 1501 (!) 146/87     Girls Systolic BP Percentile --      Girls Diastolic BP Percentile --      Boys Systolic BP Percentile --      Boys Diastolic BP Percentile --      Pulse Rate 04/07/24 1501 84     Resp 04/07/24 1501 18     Temp 04/07/24 1501 97.7 F (36.5 C)     Temp src --      SpO2 04/07/24 1501 98 %     Weight --      Height --      Head Circumference --      Peak Flow --      Pain Score 04/07/24 1500 8     Pain Loc --      Pain Education --      Exclude from Growth Chart --    No data found.  Updated Vital Signs BP (!) 146/87   Pulse 84   Temp 97.7 F (36.5 C)   Resp 18   SpO2 98%   Visual Acuity Right Eye Distance:   Left Eye Distance:   Bilateral Distance:    Right Eye Near:   Left Eye Near:    Bilateral Near:     Physical Exam Constitutional:      Appearance: Normal appearance.  Eyes:     Extraocular Movements: Extraocular movements intact.  Pulmonary:     Effort: Pulmonary effort is normal.  Chest:     Comments: Tenderness along the anterior right chest wall between ribs 2 and 5, no ecchymosis swelling or deformity, chest wall symmetrical Neurological:     Mental Status: She is alert and oriented to  person, place, and time. Mental status is at baseline.      UC Treatments / Results  Labs (all labs ordered are listed, but only abnormal results are displayed) Labs Reviewed - No data to display  EKG   Radiology No results found.  Procedures Procedures (including critical care time)  Medications Ordered in UC Medications - No data to display  Initial Impression / Assessment and Plan / UC Course  I have reviewed the triage vital signs and the nursing notes.  Pertinent labs & imaging results that were available during my care of the patient were reviewed by me and considered in my medical decision making (see chart for details).  Rib pain on right side  X-ray pending to be notified via telephone,, rib belt given for compression and support to be used as needed, discussed use of over-the-counter analgesics and supportive care through [facility], advised follow-up with primary doctor for any further concern Final Clinical Impressions(s) / UC Diagnoses   Final diagnoses:  Rib pain on right side     Discharge Instructions      Today you are evaluated for your rib pain  X-rays pending and you will be notified of results via telephone  If there is a break in the bone there is no surgical intervention and the ribs are allowed to heal naturally, more information in your packet  You have been given a rib belt to  use for compression which helps to minimize pain with movement, and may use as needed  You may apply ice or heat over the affected area in 10 to 15-minute intervals  While pain is present cushion the body with pillows whenever sitting and lying  Please follow-up with your primary doctor for any further concerns   ED Prescriptions   None    PDMP not reviewed this encounter.   Teresa Shelba SAUNDERS, NP 04/07/24 1537

## 2024-04-08 ENCOUNTER — Telehealth: Payer: Self-pay

## 2024-04-08 ENCOUNTER — Telehealth: Payer: Self-pay | Admitting: Emergency Medicine

## 2024-04-08 MED ORDER — BACLOFEN 5 MG PO TABS: 5.0000 mg | ORAL_TABLET | Freq: Three times a day (TID) | ORAL | 0 refills | Status: DC | PRN

## 2024-04-08 NOTE — Telephone Encounter (Signed)
 Copied from CRM #[id number]. Topic: Clinical - Lab/Test Results >> Apr 08, 2024 12:38 PM [name] wrote: Reason for CRM: Pt called and said that she took a really bad fall yesterday and wants Dr Marcine to take a look at the xrays she had done at [facility]. If you could please call the pt. She said that she is in pain. They did not read her xrays yesterday.  Please advise what they say and what she should do. They didn't give her anything for pain.    ----------------------------------------------------------------------- From previous Reason for Contact - Scheduling: Patient/patient representative is calling to schedule an appointment. Refer to attachments for appointment information.

## 2024-04-08 NOTE — Telephone Encounter (Signed)
 X-ray results were reported to the patient via telephone with 2 patient identifiers used 1 day prior, her daughter called clinic today wanting to further discuss x-ray results, instructed that we would need to talk to patient directly, patient joint phone call, 2 patient identifiers used, endorsing persisting pain to the ribs, questioning injury to the sternum, no injury noted on imaging, pain on exam is present to the right chest wall and is not directly over the sternum, patient continuing to take Tylenol , discussed use of NSAIDs which she is hesitant to use due to only having 1 kidney, advised to call her nephrologist which she has not done, endorses she is awaiting for return phone call.  Recommended topical medicine such as Voltaren gel, lidocaine  IcyHot etc., was given compression during time of visit, offered steroids and muscle relaxant, declined use of steroids, baclofen sent to pharmacy, advised PCP follow-up, awaiting return phone call for appointment

## 2024-04-09 ENCOUNTER — Ambulatory Visit: Payer: Self-pay

## 2024-04-09 ENCOUNTER — Other Ambulatory Visit: Payer: Self-pay

## 2024-04-09 ENCOUNTER — Emergency Department
Admission: EM | Admit: 2024-04-09 | Discharge: 2024-04-09 | Disposition: A | Discharge status: Home / Self Care | Source: Ambulatory Visit | Attending: Emergency Medicine | Admitting: Emergency Medicine

## 2024-04-09 ENCOUNTER — Emergency Department

## 2024-04-09 ENCOUNTER — Encounter: Payer: Self-pay | Admitting: Emergency Medicine

## 2024-04-09 DIAGNOSIS — S2231XA Fracture of one rib, right side, initial encounter for closed fracture: Secondary | ICD-10-CM | POA: Diagnosis not present

## 2024-04-09 DIAGNOSIS — W010XXA Fall on same level from slipping, tripping and stumbling without subsequent striking against object, initial encounter: Secondary | ICD-10-CM | POA: Diagnosis not present

## 2024-04-09 DIAGNOSIS — R0789 Other chest pain: Secondary | ICD-10-CM | POA: Diagnosis not present

## 2024-04-09 DIAGNOSIS — J9811 Atelectasis: Secondary | ICD-10-CM | POA: Diagnosis not present

## 2024-04-09 DIAGNOSIS — S299XXA Unspecified injury of thorax, initial encounter: Secondary | ICD-10-CM | POA: Diagnosis not present

## 2024-04-09 LAB — CBC WITH DIFFERENTIAL/PLATELET
Abs Immature Granulocytes: 0.01 K/uL (ref 0.00–0.07)
Basophils Absolute: 0.1 K/uL (ref 0.0–0.1)
Basophils Relative: 1 %
Eosinophils Absolute: 0.5 K/uL (ref 0.0–0.5)
Eosinophils Relative: 8 %
HCT: 42.8 % (ref 36.0–46.0)
Hemoglobin: 14.4 g/dL (ref 12.0–15.0)
Immature Granulocytes: 0 %
Lymphocytes Relative: 28 %
Lymphs Abs: 1.8 K/uL (ref 0.7–4.0)
MCH: 30.9 pg (ref 26.0–34.0)
MCHC: 33.6 g/dL (ref 30.0–36.0)
MCV: 91.8 fL (ref 80.0–100.0)
Monocytes Absolute: 0.7 K/uL (ref 0.1–1.0)
Monocytes Relative: 11 %
Neutro Abs: 3.3 K/uL (ref 1.7–7.7)
Neutrophils Relative %: 52 %
Platelets: 221 K/uL (ref 150–400)
RBC: 4.66 MIL/uL (ref 3.87–5.11)
RDW: 12.9 % (ref 11.5–15.5)
WBC: 6.3 K/uL (ref 4.0–10.5)
nRBC: 0 % (ref 0.0–0.2)

## 2024-04-09 LAB — BASIC METABOLIC PANEL WITH GFR
Anion gap: 11 (ref 5–15)
BUN: 24 mg/dL — ABNORMAL HIGH (ref 8–23)
CO2: 25 mmol/L (ref 22–32)
Calcium: 9.7 mg/dL (ref 8.9–10.3)
Chloride: 99 mmol/L (ref 98–111)
Creatinine, Ser: 0.95 mg/dL (ref 0.44–1.00)
GFR, Estimated: >60 mL/min (ref >60)
Glucose, Bld: 100 mg/dL — ABNORMAL HIGH (ref 70–99)
Potassium: 4.7 mmol/L (ref 3.5–5.1)
Sodium: 135 mmol/L (ref 135–145)

## 2024-04-09 LAB — TROPONIN I (HIGH SENSITIVITY): Troponin I (High Sensitivity): 3 ng/L (ref <18)

## 2024-04-09 MED ORDER — LIDOCAINE 5 % EX PTCH
1.0000 | MEDICATED_PATCH | CUTANEOUS | Status: DC
  Administered 2024-04-09: 1 via TRANSDERMAL
  Filled 2024-04-09: qty 1

## 2024-04-09 MED ORDER — ONDANSETRON HCL 4 MG/2ML IJ SOLN
4.0000 mg | Freq: Once | INTRAMUSCULAR | Status: AC
  Administered 2024-04-09: 4 mg via INTRAVENOUS
  Filled 2024-04-09: qty 2

## 2024-04-09 MED ORDER — LIDOCAINE 5 % EX PTCH: 1.0000 | MEDICATED_PATCH | Freq: Two times a day (BID) | CUTANEOUS | 0 refills | Status: AC

## 2024-04-09 MED ORDER — MORPHINE SULFATE (PF) 4 MG/ML IV SOLN
4.0000 mg | Freq: Once | INTRAVENOUS | Status: AC
  Administered 2024-04-09: 4 mg via INTRAVENOUS
  Filled 2024-04-09: qty 1

## 2024-04-09 MED ORDER — OXYCODONE HCL 5 MG PO TABS
5.0000 mg | ORAL_TABLET | Freq: Once | ORAL | Status: AC
  Administered 2024-04-09: 5 mg via ORAL
  Filled 2024-04-09: qty 1

## 2024-04-09 NOTE — ED Notes (Signed)
 Pt transported to CT ?

## 2024-04-09 NOTE — Discharge Instructions (Addendum)
 You have broken 1 rib.  You may take Tylenol  650 mg every 6-8 hours.  You may also use a Lidoderm  patches.  You already take the breath and use the incentive spirometer.  Please return for any new, worsening, or changing symptoms or other concerns.  It was a pleasure caring for you today.

## 2024-04-09 NOTE — Telephone Encounter (Signed)
 Per Dr. Lang I have called patient and given her advisement. Patient understood and states she will proceed to ED to be evaluated

## 2024-04-09 NOTE — Telephone Encounter (Signed)
 Called [facility] to inform them of ER Refusal  FYI Only or Action Required?: Action required by provider: request for appointment, clinical question for provider, and update on patient condition.  Patient was last seen in primary care on 03/06/2024 by Ostwalt, Janna, PA-C.  Called Nurse Triage reporting Chest Pain.  Symptoms began 2 days ago.  Interventions attempted: Ice/heat application.  Symptoms are: gradually worsening.  Triage Disposition: Go to ED Now (Notify PCP)  Patient/caregiver understands and will follow disposition?: No, wishes to speak with PCP               Copied from CRM #[id number]. Topic: Clinical - Red Word Triage >> Apr 09, 2024  8:06 AM [name] wrote: Red Word that prompted transfer to Nurse Triage: excruciating pain Reason for Disposition  SEVERE chest pain  Answer Assessment - Initial Assessment Questions Kim Pennington at 13:00 and fell face first onto concrete, her fist was between her chest and the ground so she has severe pain still in the center of her chest & near right side of chest Patient went to Urgent Care Patient states she called yesterday and hasn't had a call back and her pain is worse Unable to take a full deep breath Patient also states that her knees hurt as well Patient states her pain is at a 10 Some discoloration to chest Patient states that she has a high threshold for pain this is severe  Advised patient ER is recommended at this time and she refused at this time Patient wants her PCP [name] involved in this decision of whether she goes to the ER or not  Protocols used: Chest Pain-A-AH

## 2024-04-09 NOTE — ED Provider Notes (Signed)
 [facility] Provider Note    Event Date/Time   First MD Initiated Contact with Patient 04/09/24 1101     (approximate)   History   Fall   HPI  Kim Pennington is a 68 y.o. female who presents today for evaluation of chest wall pain after a fall.  Patient reports that she had a trip and fall 2 days ago and landed with her fist in the center of her chest.  She reports that she has pain to her sternum area, that wraps around the right side of her chest wall.  She reports that it hurts when she takes deep breaths or sits forward.  She also reports that she landed on both of her knees and these are sore as well, but she has been able to ambulate without difficulty.  She denies any history of PE or DVT.  She denies calf pain or leg swelling.  She denies any exogenous hormone use.  She denies any pain prior to her fall.  No head strike or LOC.         Physical Exam   Triage Vital Signs: ED Triage Vitals  Encounter Vitals Group     BP 04/09/24 1011 (!) 147/94     Girls Systolic BP Percentile --      Girls Diastolic BP Percentile --      Boys Systolic BP Percentile --      Boys Diastolic BP Percentile --      Pulse Rate 04/09/24 1011 74     Resp 04/09/24 1011 17     Temp 04/09/24 1011 98 F (36.7 C)     Temp Source 04/09/24 1011 Oral     SpO2 04/09/24 1011 96 %     Weight 04/09/24 1012 130 lb (59 kg)     Height 04/09/24 1012 5' 2 (1.575 m)     Head Circumference --      Peak Flow --      Pain Score 04/09/24 1012 9     Pain Loc --      Pain Education --      Exclude from Growth Chart --     Most recent vital signs: Vitals:   04/09/24 1011 04/09/24 1309  BP: (!) 147/94 129/86  Pulse: 74 68  Resp: 17 18  Temp: 98 F (36.7 C)   SpO2: 96% 97%    Physical Exam Vitals and nursing note reviewed.  Constitutional:      General: Awake and alert. No acute distress.    Appearance: Normal appearance. The patient is normal weight.  HENT:     Head:  Normocephalic and atraumatic.     Mouth: Mucous membranes are moist.  Eyes:     General: PERRL. Normal EOMs        Right eye: No discharge.        Left eye: No discharge.     Conjunctiva/sclera: Conjunctivae normal.  Cardiovascular:     Rate and Rhythm: Normal rate and regular rhythm.     Pulses: Normal pulses.     Heart sounds: Normal heart sounds No obvious ecchymosis or wounds to her chest wall, no tenderness to palpation along her sternum and right lateral ribs. Pulmonary:     Effort: Pulmonary effort is normal. No respiratory distress.     Breath sounds: Normal breath sounds.  Abdominal:     Abdomen is soft. There is no abdominal tenderness. No rebound or guarding. No distention. Musculoskeletal:  General: No swelling. Normal range of motion.     Cervical back: Normal range of motion and neck supple.  Normal gait Skin:    General: Skin is warm and dry.     Capillary Refill: Capillary refill takes less than 2 seconds.     Findings: No rash.  Neurological:     Mental Status: The patient is awake and alert.      ED Results / Procedures / Treatments   Labs (all labs ordered are listed, but only abnormal results are displayed) Labs Reviewed  BASIC METABOLIC PANEL WITH GFR - Abnormal; Notable for the following components:      Result Value   Glucose, Bld 100 (*)    BUN 24 (*)    All other components within normal limits  CBC WITH DIFFERENTIAL/PLATELET  TROPONIN I (HIGH SENSITIVITY)  TROPONIN I (HIGH SENSITIVITY)     EKG     RADIOLOGY I independently reviewed and interpreted imaging and agree with radiologists findings.     PROCEDURES:  Critical Care performed:   Procedures   MEDICATIONS ORDERED IN ED: Medications  lidocaine  (LIDODERM ) 5 % 1 patch (1 patch Transdermal Patch Applied 04/09/24 1224)  oxyCODONE  (Oxy IR/ROXICODONE ) immediate release tablet 5 mg (5 mg Oral Given 04/09/24 1122)  morphine  (PF) 4 MG/ML injection 4 mg (4 mg Intravenous  Given 04/09/24 1211)  ondansetron  (ZOFRAN ) injection 4 mg (4 mg Intravenous Given 04/09/24 1220)     IMPRESSION / MDM / ASSESSMENT AND PLAN / ED COURSE  I reviewed the triage vital signs and the nursing notes.   Differential diagnosis includes, but is not limited to, rib fracture, sternal fracture, pneumothorax, contusion.  I reviewed the patient's chart.  Patient was seen at urgent care 2 days ago when this happened.  She had an x-ray of her right ribs and chest which were negative for any acute findings.  Patient also recently underwent CT coronary which revealed a coronary calcium  score of 0 and no evidence of CAD.  Further workup is indicated.  Given her persistent chest wall pain, CT scan obtained, as well as blood work.  I do not suspect acute coronary syndrome given her recent negative CT coronary morphology.  I also do not suspect pulmonary embolism given no personal or family history of PE or DVT, no clinical signs or symptoms of DVT, no hemoptysis, no tachycardia, no hypoxia, no pain prior to her fall, and her easily reproducible tenderness palpation.  She has a Wells score of 0.  Patient was treated symptomatically with oxycodone  initially given that she cannot have NSAIDs and has been taking Tylenol  at home.  She was made aware of the risks of addiction with this medication, and she would like to proceed with this medication.  However, she reports that she did not have any improvement of her symptoms with the oxycodone  and wanted to try something stronger.  She was given morphine  with improvement.  CT scan reveals a mildly displaced fifth anterior rib fracture.  There is no underlying hemothorax or pneumothorax.  Labs are overall reassuring including a normal troponin, unlikely cardiac contusion.  Discussed these findings with the patient.  She was offered a prescription for oxycodone  but declined.  She was given a prescription for Lidoderm  patches.  We discussed return precautions and the  importance of deep inspiration.  She was given incentive spirometer.  We discussed return precautions.  Patient understands and agrees with plan.  Discharged in stable condition.    Patient's presentation is  most consistent with acute presentation with potential threat to life or bodily function.    FINAL CLINICAL IMPRESSION(S) / ED DIAGNOSES   Final diagnoses:  Closed fracture of one rib of right side, initial encounter     Rx / DC Orders   ED Discharge Orders          Ordered    lidocaine  (LIDODERM ) 5 %  Every 12 hours        04/09/24 1255             Note:  This document was prepared using Dragon voice recognition software and may include unintentional dictation errors.   Akesha Uresti E, PA-C 04/09/24 1504    Dorothyann Drivers, MD 04/09/24 [phone]

## 2024-04-09 NOTE — ED Triage Notes (Signed)
 Patient to ED via POV for a fall that occurred on Monday. Pt reports a mechanical fall. C/o chest and bilateral knees. Seen at [facility] on 11/3 for same with x-rays that were unremarkable. States painful to take a deep breathe. Sent for further evaluation due to still having pain.

## 2024-04-09 NOTE — Telephone Encounter (Signed)
 Please recommend ED evaluation. I want to make sure she does not have PE or is having any ischemia related to her heart

## 2024-04-09 NOTE — Telephone Encounter (Signed)
 FYI-Spoke with patient and verbalized understanding. State will go to the ED.

## 2024-04-14 DIAGNOSIS — H02051 Trichiasis without entropian right upper eyelid: Secondary | ICD-10-CM | POA: Diagnosis not present

## 2024-04-14 DIAGNOSIS — M3501 Sicca syndrome with keratoconjunctivitis: Secondary | ICD-10-CM | POA: Diagnosis not present

## 2024-04-14 DIAGNOSIS — H5712 Ocular pain, left eye: Secondary | ICD-10-CM | POA: Diagnosis not present

## 2024-04-14 DIAGNOSIS — H02889 Meibomian gland dysfunction of unspecified eye, unspecified eyelid: Secondary | ICD-10-CM | POA: Diagnosis not present

## 2024-04-18 ENCOUNTER — Ambulatory Visit: Payer: Self-pay

## 2024-04-18 DIAGNOSIS — M5489 Other dorsalgia: Secondary | ICD-10-CM

## 2024-04-18 NOTE — Telephone Encounter (Signed)
 Refused appt or UC, would like to request rx for muscle relaxer from provider be sent to the below pharmacy. Advised that if provider requires appt, someone will contact her.  [id number] - [place], [place] - [address] [phone]   FYI Only or Action Required?: Action required by provider: clinical question for provider.  Patient was last seen in primary care on 03/06/2024 by Ostwalt, Janna, PA-C.  Called Nurse Triage reporting No chief complaint on file..  Symptoms began several days ago.  Interventions attempted: Rest, hydration, or home remedies.  Symptoms are: gradually worsening.  Triage Disposition: See HCP Within 4 Hours (Or PCP Triage)  Patient/caregiver understands and will follow disposition?: No, would like to request Rx without visit Reason for Disposition  [1] SEVERE back pain (e.g., excruciating, unable to do any normal activities) AND [2] not improved 2 hours after pain medicine  [1] Pain radiates into the thigh or further down the leg AND [2] both legs  Answer Assessment - Initial Assessment Questions Pt reports back spasms after sneezing 2 days ago. Was prescribed short course of baclofen when she fx a rib on 04/08/24 but states was not effective. Is currently taking tylenol  PM at night.   1. ONSET: When did the pain begin? (e.g., minutes, hours, days)     2 days  2. LOCATION: Where does it hurt? (upper, mid or lower back)     Lower to mid back  3. SEVERITY: How bad is the pain?  (e.g., Scale 1-10; mild, moderate, or severe)     9/10  4. PATTERN: Is the pain constant? (e.g., yes, no; constant, intermittent)      Worse when laying down or changing positions  5. RADIATION: Does the pain shoot into your legs or somewhere else?     Yes, down both legs  Protocols used: Back Pain-A-AH Copied from CRM #[id number]. Topic: Clinical - Medication Question >> Apr 18, 2024 12:20 PM Rachelle R wrote: Reason  for CRM: Patient is requesting to see if she can be prescribed a muscle relaxer due to muscle spasms she is having. They are causing difficulty sleeping.   Patient can be reached at [phone]

## 2024-04-21 MED ORDER — TIZANIDINE HCL 4 MG PO TABS: 4.0000 mg | ORAL_TABLET | Freq: Four times a day (QID) | ORAL | 0 refills | Status: DC | PRN

## 2024-04-21 NOTE — Telephone Encounter (Signed)
 Patient notified

## 2024-04-21 NOTE — Telephone Encounter (Signed)
 Zanaflex 4 mg every 6 hours PRN sent to pharmacy

## 2024-04-21 NOTE — Addendum Note (Signed)
 Addended by: SIMMONS-ROBINSON, Myya Meenach L on: 04/21/2024 01:09 PM   Modules accepted: Orders

## 2024-04-22 ENCOUNTER — Other Ambulatory Visit: Payer: Self-pay

## 2024-04-22 DIAGNOSIS — M5489 Other dorsalgia: Secondary | ICD-10-CM

## 2024-04-22 NOTE — Telephone Encounter (Signed)
 Copied from CRM [id number]. Topic: Clinical - Prescription Issue >> Apr 22, 2024 11:14 AM Kim Pennington wrote: Reason for CRM: Pt called in requesting information on medication tiZANidine (ZANAFLEX) 4 MG tablet. Per pt chart pt was prescribed this medication for back pain and this was sent to pharmacy on 11/17. Pt stated she contacted pharmacy and was told that it is not there for them to fill. I contacted [facility] [address] in [place] to confirm and was told medication is not there. I also contacted [facility] [address] in [place] and was told medication isnt there either. I informed pt with info and was told I will send a message to inform clinic. Pt is requesting a call back when medication is sent to pharmacy and can be picked up.  Pt is needing medication to go to: [facility] [address] [place] [phone]

## 2024-04-23 MED ORDER — TIZANIDINE HCL 4 MG PO TABS: 4.0000 mg | ORAL_TABLET | Freq: Four times a day (QID) | ORAL | 0 refills | Status: DC | PRN

## 2024-04-28 DIAGNOSIS — M79641 Pain in right hand: Secondary | ICD-10-CM | POA: Diagnosis not present

## 2024-04-28 DIAGNOSIS — R2231 Localized swelling, mass and lump, right upper limb: Secondary | ICD-10-CM | POA: Diagnosis not present

## 2024-04-28 DIAGNOSIS — M79644 Pain in right finger(s): Secondary | ICD-10-CM | POA: Diagnosis not present

## 2024-04-29 ENCOUNTER — Telehealth: Payer: Self-pay

## 2024-04-29 DIAGNOSIS — M810 Age-related osteoporosis without current pathological fracture: Secondary | ICD-10-CM

## 2024-04-29 DIAGNOSIS — Z1231 Encounter for screening mammogram for malignant neoplasm of breast: Secondary | ICD-10-CM

## 2024-04-29 NOTE — Telephone Encounter (Unsigned)
 Copied from CRM [id number]. Topic: Referral - Request for Referral >> Apr 29, 2024 12:40 PM Berwyn MATSU wrote: Did the patient discuss referral with their provider in the last year? Yes (If No - schedule appointment) (If Yes - send message)  Appointment offered? Yes  Type of order/referral and detailed reason for visit: Mammogram and dexa scan   Preference of office, provider, location: [facility]:ph: [phone]  If referral order, have you been seen by this specialty before? Yes (If Yes, this issue or another issue? When? Where?  Can we respond through MyChart? Yes

## 2024-04-30 NOTE — Telephone Encounter (Signed)
 Pt advised. Verbalized understanding.

## 2024-04-30 NOTE — Telephone Encounter (Signed)
 Annual bone density scan ordered for progressive osteoporosis

## 2024-05-07 ENCOUNTER — Ambulatory Visit: Admitting: Family Medicine

## 2024-05-07 ENCOUNTER — Encounter: Payer: Self-pay | Admitting: Family Medicine

## 2024-05-07 ENCOUNTER — Ambulatory Visit
Admission: RE | Admit: 2024-05-07 | Discharge: 2024-05-07 | Disposition: A | Attending: Family Medicine | Admitting: Family Medicine

## 2024-05-07 ENCOUNTER — Ambulatory Visit
Admission: RE | Admit: 2024-05-07 | Discharge: 2024-05-07 | Disposition: A | Source: Ambulatory Visit | Attending: Family Medicine

## 2024-05-07 VITALS — BP 130/88 | HR 86 | Ht 62.0 in | Wt 135.7 lb

## 2024-05-07 DIAGNOSIS — S2231XD Fracture of one rib, right side, subsequent encounter for fracture with routine healing: Secondary | ICD-10-CM | POA: Diagnosis not present

## 2024-05-07 DIAGNOSIS — M25561 Pain in right knee: Secondary | ICD-10-CM | POA: Insufficient documentation

## 2024-05-07 DIAGNOSIS — F39 Unspecified mood [affective] disorder: Secondary | ICD-10-CM | POA: Diagnosis not present

## 2024-05-07 DIAGNOSIS — M8000XD Age-related osteoporosis with current pathological fracture, unspecified site, subsequent encounter for fracture with routine healing: Secondary | ICD-10-CM | POA: Diagnosis not present

## 2024-05-07 DIAGNOSIS — M25562 Pain in left knee: Secondary | ICD-10-CM | POA: Insufficient documentation

## 2024-05-07 DIAGNOSIS — G4709 Other insomnia: Secondary | ICD-10-CM | POA: Diagnosis not present

## 2024-05-07 MED ORDER — ALPRAZOLAM 1 MG PO TABS: 1.0000 mg | ORAL_TABLET | Freq: Every day | ORAL | 1 refills | Status: AC

## 2024-05-07 NOTE — Progress Notes (Signed)
 Established patient visit   Patient: Kim Pennington   DOB: 19-Jan-1956   68 y.o. Female  MRN: [id number] Visit Date: 05/07/2024  Today's healthcare provider: Rockie Agent, MD   Chief Complaint  Patient presents with   Hypertension   Medical Management of Chronic Issues    Patient is present to f/u htn and ED f/u fro broken ribs. Reports still having some pain Requests refills of alprazolam , would like higher dose if able and refill of mupirocin oint (not on current med list)    Subjective     HPI     Medical Management of Chronic Issues    Additional comments: Patient is present to f/u htn and ED f/u fro broken ribs. Reports still having some pain Requests refills of alprazolam , would like higher dose if able and refill of mupirocin oint (not on current med list)       Last edited by Cherry Chiquita HERO, CMA on 05/07/2024  8:17 AM.       Discussed the use of AI scribe software for clinical note transcription with the patient, who gave verbal consent to proceed.  History of Present Illness Kim Pennington is a 68 year old female with osteoporosis and rheumatoid arthritis who presents for a refill of Xanax  and management of rib fracture pain.  She is currently taking Xanax  0.5 mg as needed, but this dose is insufficient for her sleep issues, as she is only getting four to five hours of sleep per night. She is considering an increased dose to help manage her stress and improve sleep, especially given her responsibilities of teaching music and caring for her husband who is in major heart failure.  She sustained a right-sided rib fracture on November 5th after a fall. She tripped while wearing flip flops, landed on her chest, and experienced sternal pain. A CT scan showed a minimally displaced fracture of the anterior portion of the right fifth rib. She was initially prescribed oxycodone , which she did not tolerate due to vomiting, and has been using muscle relaxants  occasionally. She reports ongoing pain, particularly when lifting or lying on her side, and describes a tender area under her right breast.  She has a history of osteoporosis. She is actively trying to maintain her health by walking and staying active to prevent further complications.  She also reports bilateral knee pain following the fall, with tenderness but no visible bruising or swelling. She is concerned about the possibility of a fracture given her osteoporosis history.  She is scheduled for surgery on December 23rd for rheumatoid arthritis affecting her right hand, specifically a nodule between her third and fourth digits. She describes significant pain and swelling in her hand, impacting her ability to perform daily activities.     Past Medical History:  Diagnosis Date   Acquired solitary kidney    right nephrectomy   Anemia    Anxiety    Arthritis    C. difficile diarrhea    Depression    Hepatitis B    Hyperlipidemia    Hypertension    Kidney disease, chronic, stage III (GFR 30-59 ml/min) (HCC)    Kidney stones    Lymphocytic colitis    Memory loss    Osteoporosis    Pacemaker    Renal failure, chronic, stage 3 (moderate) (HCC) 2015   Left kidney    Skin cancer     Medications: Outpatient Medications Prior to Visit  Medication Sig   alendronate  (FOSAMAX ) 70  MG tablet Take 1 tablet (70 mg total) by mouth once a week.   atorvastatin  (LIPITOR ) 20 MG tablet Take 1 tablet (20 mg total) by mouth daily.   Calcium  Carbonate-Vit D-Min (CALCIUM  1200 PO) Take 1 tablet by mouth daily.   CRANBERRY PO Take 1 tablet by mouth daily.   metoprolol  tartrate (LOPRESSOR ) 100 MG tablet Take 1 tablet (100 mg total) by mouth once for 1 dose.   pantoprazole  (PROTONIX ) 20 MG tablet Take 1 tablet (20 mg total) by mouth daily.   prazosin  (MINIPRESS ) 1 MG capsule Take 1 capsule (1 mg total) by mouth at bedtime.   tamsulosin  (FLOMAX ) 0.4 MG CAPS capsule Take 0.4 mg by mouth at bedtime.    valACYclovir  (VALTREX ) 1000 MG tablet Take 1 tablet (1,000 mg total) by mouth 2 (two) times daily as needed.   [DISCONTINUED] ALPRAZolam  (XANAX ) 0.5 MG tablet Take 1 tablet (0.5 mg total) by mouth as needed for anxiety.   [DISCONTINUED] HUMIRA, 2 PEN, 40 MG/0.4ML pen    [DISCONTINUED] tiZANidine  (ZANAFLEX ) 4 MG tablet Take 1 tablet (4 mg total) by mouth every 6 (six) hours as needed for muscle spasms.   No facility-administered medications prior to visit.    Review of Systems      Objective    BP 130/88 (BP Location: Left Arm, Patient Position: Standing, Cuff Size: Normal)   Pulse 86   Ht 5' 2 (1.575 m)   Wt 135 lb 11.2 oz (61.6 kg)   SpO2 99%   BMI 24.82 kg/m      Physical Exam  Physical Exam VITALS: BP- 130/88 GENERAL: Ambulating independently, in NAD  CHEST: Clear to auscultation bilaterally, normal respiratory effort, no respiratory distress, tenderness along right inferior rib and right sternal border MUSCULOSKELETAL: Tenderness to inferior bilateral knees, no bruising or swelling of knees    No results found for any visits on 05/07/24.  Assessment & Plan     Problem List Items Addressed This Visit     Insomnia   Mood disorder   Relevant Medications   ALPRAZolam  (XANAX ) 1 MG tablet   Osteoporosis   Other Visit Diagnoses       Closed fracture of one rib of right side with routine healing, subsequent encounter    -  Primary     Acute pain of both knees       Relevant Orders   DG Knee Complete 4 Views Right   DG Knee Complete 4 Views Left       Assessment and Plan Assessment & Plan Anxiety disorder Chronic anxiety disorder with difficulty sleeping, exacerbated by stress related to caregiving responsibilities and recent fall. Current alprazolam  0.5 mg at bedtime is insufficient for sleep. - Increased alprazolam  to 1 mg at bedtime. - Prescribed a 90-day supply of alprazolam .  Closed fracture of right fifth rib Subacute, hx of osteoporosis  Minimally  displaced fracture of the anterior portion of the right fifth rib sustained from a fall. Persistent pain managed with muscle relaxants and Lidoderm  patches. No current use of oxycodone  due to adverse effects. Osteoporosis may contribute to fracture risk. - Continue muscle relaxants as needed. - Continue Lidoderm  patches for pain management. - Ordered imaging to assess for additional injuries.  Persistent Knee pain  Bilateral knee pain following fall ~1 month ago  Bilateral knee xrays ordered  -offered refill for oxycodone  and muscle relaxer, pt politely declines at this time    No follow-ups on file.  Rockie Agent, MD  [facility] [phone] (phone) [phone] (fax)  [facility]

## 2024-05-07 NOTE — Patient Instructions (Signed)
 Please report to [facility] located at:  [address]  [place], [place] [address]  You do not need an appointment to have xrays completed.   Our office will follow up with  results once available.      To keep you healthy, please keep in mind the following health maintenance items that you are due for:   Health Maintenance Due  Topic Date Due   DTaP/Tdap/Td (1 - Tdap) Never done   Zoster Vaccines- Shingrix (2 of 2) 08/08/2022   Mammogram  05/30/2024     Best Wishes,   Dr. Lang

## 2024-05-14 ENCOUNTER — Ambulatory Visit: Payer: Self-pay

## 2024-05-14 DIAGNOSIS — I495 Sick sinus syndrome: Secondary | ICD-10-CM | POA: Diagnosis not present

## 2024-05-15 LAB — CUP PACEART REMOTE DEVICE CHECK
Battery Remaining Longevity: 23 mo
Battery Remaining Percentage: 18 %
Battery Voltage: 2.9 V
Brady Statistic AP VP Percent: 1 %
Brady Statistic AP VS Percent: 13 %
Brady Statistic AS VP Percent: 1 %
Brady Statistic AS VS Percent: 87 %
Brady Statistic RA Percent Paced: 13 %
Brady Statistic RV Percent Paced: 1 %
Date Time Interrogation Session: 20251210022509
Implantable Lead Connection Status: 753985
Implantable Lead Connection Status: 753985
Implantable Lead Implant Date: 20151120
Implantable Lead Implant Date: 20151120
Implantable Lead Location: 753862
Implantable Lead Location: 753862
Implantable Pulse Generator Implant Date: 20151120
Lead Channel Impedance Value: 430 Ohm
Lead Channel Impedance Value: 590 Ohm
Lead Channel Pacing Threshold Amplitude: 0.5 V
Lead Channel Pacing Threshold Amplitude: 0.75 V
Lead Channel Pacing Threshold Pulse Width: 0.5 ms
Lead Channel Pacing Threshold Pulse Width: 0.5 ms
Lead Channel Sensing Intrinsic Amplitude: 1.3 mV
Lead Channel Sensing Intrinsic Amplitude: 6.4 mV
Lead Channel Setting Pacing Amplitude: 2 V
Lead Channel Setting Pacing Amplitude: 2 V
Lead Channel Setting Pacing Pulse Width: 0.5 ms
Lead Channel Setting Sensing Sensitivity: 0.5 mV
Pulse Gen Model: 2240
Pulse Gen Serial Number: 7689719

## 2024-05-16 DIAGNOSIS — M0609 Rheumatoid arthritis without rheumatoid factor, multiple sites: Secondary | ICD-10-CM | POA: Diagnosis not present

## 2024-05-20 ENCOUNTER — Ambulatory Visit: Payer: Self-pay | Admitting: Family Medicine

## 2024-05-21 NOTE — Progress Notes (Signed)
 Remote PPM Transmission

## 2024-05-23 ENCOUNTER — Ambulatory Visit: Payer: Self-pay | Admitting: Cardiology

## 2024-05-27 ENCOUNTER — Other Ambulatory Visit: Payer: Self-pay | Admitting: Orthopedic Surgery

## 2024-05-28 LAB — SURGICAL PATHOLOGY

## 2024-06-17 ENCOUNTER — Ambulatory Visit: Payer: Self-pay | Admitting: Family Medicine

## 2024-06-17 ENCOUNTER — Ambulatory Visit
Admission: RE | Admit: 2024-06-17 | Discharge: 2024-06-17 | Disposition: A | Source: Ambulatory Visit | Attending: Family Medicine

## 2024-06-17 ENCOUNTER — Ambulatory Visit
Admission: RE | Admit: 2024-06-17 | Discharge: 2024-06-17 | Disposition: A | Discharge status: Home / Self Care | Source: Ambulatory Visit | Attending: Family Medicine | Admitting: Family Medicine

## 2024-06-17 DIAGNOSIS — Z1231 Encounter for screening mammogram for malignant neoplasm of breast: Secondary | ICD-10-CM | POA: Insufficient documentation

## 2024-06-17 DIAGNOSIS — M810 Age-related osteoporosis without current pathological fracture: Secondary | ICD-10-CM

## 2024-07-01 ENCOUNTER — Other Ambulatory Visit: Payer: Self-pay | Admitting: Nurse Practitioner

## 2024-07-01 DIAGNOSIS — F4310 Post-traumatic stress disorder, unspecified: Secondary | ICD-10-CM

## 2024-07-17 ENCOUNTER — Ambulatory Visit: Admitting: Family Medicine
